# Patient Record
Sex: Female | Born: 1950
Health system: Southern US, Community
[De-identification: ages and names within clinical notes are randomized; demographics above are authoritative.]

## PROBLEM LIST (undated history)

## (undated) DIAGNOSIS — N029 Recurrent and persistent hematuria with unspecified morphologic changes: Secondary | ICD-10-CM

## (undated) DIAGNOSIS — R928 Other abnormal and inconclusive findings on diagnostic imaging of breast: Secondary | ICD-10-CM

## (undated) DIAGNOSIS — L409 Psoriasis, unspecified: Secondary | ICD-10-CM

## (undated) DIAGNOSIS — K219 Gastro-esophageal reflux disease without esophagitis: Secondary | ICD-10-CM

## (undated) DIAGNOSIS — F32A Depression, unspecified: Secondary | ICD-10-CM

## (undated) DIAGNOSIS — F329 Major depressive disorder, single episode, unspecified: Secondary | ICD-10-CM

## (undated) DIAGNOSIS — I10 Essential (primary) hypertension: Secondary | ICD-10-CM

## (undated) DIAGNOSIS — R51 Headache: Secondary | ICD-10-CM

## (undated) DIAGNOSIS — M8580 Other specified disorders of bone density and structure, unspecified site: Secondary | ICD-10-CM

## (undated) DIAGNOSIS — E785 Hyperlipidemia, unspecified: Secondary | ICD-10-CM

## (undated) DIAGNOSIS — R0683 Snoring: Secondary | ICD-10-CM

## (undated) DIAGNOSIS — K449 Diaphragmatic hernia without obstruction or gangrene: Secondary | ICD-10-CM

## (undated) DIAGNOSIS — Z98811 Dental restoration status: Secondary | ICD-10-CM

## (undated) DIAGNOSIS — H269 Unspecified cataract: Secondary | ICD-10-CM

## (undated) DIAGNOSIS — T7840XA Allergy, unspecified, initial encounter: Secondary | ICD-10-CM

## (undated) HISTORY — PX: OTHER SURGICAL HISTORY: SHX169

## (undated) HISTORY — DX: Depression, unspecified: F32.A

## (undated) HISTORY — PX: COLONOSCOPY: SHX174

## (undated) HISTORY — DX: Other specified disorders of bone density and structure, unspecified site: M85.80

## (undated) HISTORY — DX: Essential (primary) hypertension: I10

## (undated) HISTORY — PX: TONSILLECTOMY: SUR1361

## (undated) HISTORY — DX: Diaphragmatic hernia without obstruction or gangrene: K44.9

## (undated) HISTORY — DX: Allergy, unspecified, initial encounter: T78.40XA

## (undated) HISTORY — DX: Hyperlipidemia, unspecified: E78.5

## (undated) HISTORY — DX: Gastro-esophageal reflux disease without esophagitis: K21.9

## (undated) HISTORY — DX: Major depressive disorder, single episode, unspecified: F32.9

## (undated) HISTORY — DX: Unspecified cataract: H26.9

## (undated) HISTORY — PX: DILATION AND CURETTAGE OF UTERUS: SHX78

## (undated) HISTORY — DX: Psoriasis, unspecified: L40.9

---

## 2010-11-02 ENCOUNTER — Other Ambulatory Visit
Admission: RE | Admit: 2010-11-02 | Discharge: 2010-11-02 | Payer: Self-pay | Source: Home / Self Care | Admitting: Obstetrics and Gynecology

## 2010-11-16 LAB — HM MAMMOGRAPHY

## 2010-12-13 ENCOUNTER — Ambulatory Visit (INDEPENDENT_AMBULATORY_CARE_PROVIDER_SITE_OTHER): Payer: BC Managed Care – PPO | Admitting: Internal Medicine

## 2010-12-13 ENCOUNTER — Encounter: Payer: Self-pay | Admitting: Internal Medicine

## 2010-12-13 DIAGNOSIS — E119 Type 2 diabetes mellitus without complications: Secondary | ICD-10-CM

## 2010-12-13 DIAGNOSIS — E1169 Type 2 diabetes mellitus with other specified complication: Secondary | ICD-10-CM | POA: Insufficient documentation

## 2010-12-13 DIAGNOSIS — R945 Abnormal results of liver function studies: Secondary | ICD-10-CM

## 2010-12-13 DIAGNOSIS — R7989 Other specified abnormal findings of blood chemistry: Secondary | ICD-10-CM

## 2010-12-13 DIAGNOSIS — Z23 Encounter for immunization: Secondary | ICD-10-CM

## 2010-12-13 DIAGNOSIS — E1159 Type 2 diabetes mellitus with other circulatory complications: Secondary | ICD-10-CM | POA: Insufficient documentation

## 2010-12-13 DIAGNOSIS — K760 Fatty (change of) liver, not elsewhere classified: Secondary | ICD-10-CM | POA: Insufficient documentation

## 2010-12-13 DIAGNOSIS — N029 Recurrent and persistent hematuria with unspecified morphologic changes: Secondary | ICD-10-CM

## 2010-12-13 DIAGNOSIS — I10 Essential (primary) hypertension: Secondary | ICD-10-CM | POA: Insufficient documentation

## 2010-12-13 DIAGNOSIS — L409 Psoriasis, unspecified: Secondary | ICD-10-CM

## 2010-12-13 DIAGNOSIS — E785 Hyperlipidemia, unspecified: Secondary | ICD-10-CM

## 2010-12-13 DIAGNOSIS — F418 Other specified anxiety disorders: Secondary | ICD-10-CM

## 2010-12-13 DIAGNOSIS — I152 Hypertension secondary to endocrine disorders: Secondary | ICD-10-CM

## 2010-12-13 DIAGNOSIS — K219 Gastro-esophageal reflux disease without esophagitis: Secondary | ICD-10-CM

## 2010-12-13 LAB — HEPATIC FUNCTION PANEL
ALT: 52 U/L — ABNORMAL HIGH (ref 0–35)
AST: 44 U/L — ABNORMAL HIGH (ref 0–37)
Albumin: 4.4 g/dL (ref 3.5–5.2)
Total Protein: 7.5 g/dL (ref 6.0–8.3)

## 2010-12-13 MED ORDER — CITALOPRAM HYDROBROMIDE 20 MG PO TABS: 20.0000 mg | ORAL_TABLET | Freq: Every day | ORAL | Status: DC

## 2010-12-13 MED ORDER — EZETIMIBE-SIMVASTATIN 10-40 MG PO TABS: 1.0000 | ORAL_TABLET | Freq: Every day | ORAL | Status: DC

## 2010-12-13 MED ORDER — VITAMIN D 50 MCG (2000 UT) PO CAPS: 1.0000 | ORAL_CAPSULE | ORAL | Status: DC

## 2010-12-13 MED ORDER — OMEPRAZOLE 40 MG PO CPDR: 40.0000 mg | DELAYED_RELEASE_CAPSULE | Freq: Every day | ORAL | Status: DC

## 2010-12-13 NOTE — Assessment & Plan Note (Signed)
This is a new diagnosis from her physical she is on a statin drug and on Celexa an antidepressant we will repeat liver functions today to determine if further evaluation is necessary and/or removal of drugs for a trial to see if they're causing the elevation I suspect that the repeat

## 2010-12-13 NOTE — Assessment & Plan Note (Signed)
Diet controlled  Last A1C   6.5

## 2010-12-13 NOTE — Assessment & Plan Note (Signed)
On a topical cream and doing well if is faithfull with treament

## 2010-12-13 NOTE — Assessment & Plan Note (Signed)
Stable on an ARB

## 2010-12-13 NOTE — Assessment & Plan Note (Signed)
Stable on celexa

## 2010-12-14 ENCOUNTER — Other Ambulatory Visit: Payer: Self-pay | Admitting: *Deleted

## 2010-12-14 DIAGNOSIS — R7989 Other specified abnormal findings of blood chemistry: Secondary | ICD-10-CM

## 2010-12-20 ENCOUNTER — Ambulatory Visit
Admission: RE | Admit: 2010-12-20 | Discharge: 2010-12-20 | Disposition: A | Discharge status: Home / Self Care | Payer: BC Managed Care – PPO | Source: Ambulatory Visit | Attending: Internal Medicine | Admitting: Internal Medicine

## 2010-12-20 DIAGNOSIS — R7989 Other specified abnormal findings of blood chemistry: Secondary | ICD-10-CM

## 2011-01-09 ENCOUNTER — Encounter: Payer: Self-pay | Admitting: Internal Medicine

## 2011-02-02 ENCOUNTER — Telehealth: Payer: Self-pay | Admitting: Internal Medicine

## 2011-02-02 MED ORDER — VALSARTAN-HYDROCHLOROTHIAZIDE 80-12.5 MG PO TABS: 1.0000 | ORAL_TABLET | Freq: Every day | ORAL | Status: DC

## 2011-02-02 NOTE — Telephone Encounter (Signed)
done

## 2011-02-02 NOTE — Telephone Encounter (Signed)
Pt called and is req script for Diovan HCT 80-12.5 mg, to be sent to [facility].

## 2011-02-03 ENCOUNTER — Encounter: Payer: Self-pay | Admitting: Internal Medicine

## 2011-02-13 ENCOUNTER — Other Ambulatory Visit: Payer: Self-pay | Admitting: Internal Medicine

## 2011-02-13 DIAGNOSIS — Z1231 Encounter for screening mammogram for malignant neoplasm of breast: Secondary | ICD-10-CM

## 2011-02-21 ENCOUNTER — Ambulatory Visit
Admission: RE | Admit: 2011-02-21 | Discharge: 2011-02-21 | Disposition: A | Discharge status: Home / Self Care | Payer: BC Managed Care – PPO | Source: Ambulatory Visit | Attending: Internal Medicine | Admitting: Internal Medicine

## 2011-02-21 DIAGNOSIS — Z1231 Encounter for screening mammogram for malignant neoplasm of breast: Secondary | ICD-10-CM

## 2011-05-05 ENCOUNTER — Other Ambulatory Visit (INDEPENDENT_AMBULATORY_CARE_PROVIDER_SITE_OTHER): Payer: BC Managed Care – PPO | Admitting: Internal Medicine

## 2011-05-05 DIAGNOSIS — Z0000 Encounter for general adult medical examination without abnormal findings: Secondary | ICD-10-CM

## 2011-05-05 LAB — CBC WITH DIFFERENTIAL/PLATELET
Basophils Relative: 0.4 % (ref 0.0–3.0)
Eosinophils Absolute: 0.1 10*3/uL (ref 0.0–0.7)
Hemoglobin: 14.5 g/dL (ref 12.0–15.0)
Lymphs Abs: 2.2 10*3/uL (ref 0.7–4.0)
MCHC: 33.7 g/dL (ref 30.0–36.0)
MCV: 91.6 fl (ref 78.0–100.0)
Monocytes Absolute: 0.8 10*3/uL (ref 0.1–1.0)
Neutro Abs: 3.2 10*3/uL (ref 1.4–7.7)
RBC: 4.7 Mil/uL (ref 3.87–5.11)
RDW: 13.6 % (ref 11.5–14.6)

## 2011-05-05 LAB — HEPATIC FUNCTION PANEL
Albumin: 4.6 g/dL (ref 3.5–5.2)
Total Protein: 8 g/dL (ref 6.0–8.3)

## 2011-05-05 LAB — HEMOGLOBIN A1C: Hgb A1c MFr Bld: 6.8 % — ABNORMAL HIGH (ref 4.6–6.5)

## 2011-05-05 LAB — BASIC METABOLIC PANEL
CO2: 30 mEq/L (ref 19–32)
Chloride: 99 mEq/L (ref 96–112)
Glucose, Bld: 130 mg/dL — ABNORMAL HIGH (ref 70–99)
Sodium: 140 mEq/L (ref 135–145)

## 2011-05-05 LAB — LIPID PANEL
Cholesterol: 159 mg/dL (ref 0–200)
HDL: 47.7 mg/dL (ref >39.00)
Triglycerides: 179 mg/dL — ABNORMAL HIGH (ref 0.0–149.0)

## 2011-05-05 LAB — MICROALBUMIN / CREATININE URINE RATIO
Creatinine,U: 41.5 mg/dL
Microalb Creat Ratio: 4.1 mg/g (ref 0.0–30.0)

## 2011-05-12 ENCOUNTER — Encounter: Payer: Self-pay | Admitting: Internal Medicine

## 2011-05-12 ENCOUNTER — Ambulatory Visit (INDEPENDENT_AMBULATORY_CARE_PROVIDER_SITE_OTHER): Payer: 59 | Admitting: Internal Medicine

## 2011-05-12 DIAGNOSIS — I10 Essential (primary) hypertension: Secondary | ICD-10-CM

## 2011-05-12 DIAGNOSIS — Z0000 Encounter for general adult medical examination without abnormal findings: Secondary | ICD-10-CM

## 2011-05-12 DIAGNOSIS — E1159 Type 2 diabetes mellitus with other circulatory complications: Secondary | ICD-10-CM

## 2011-05-12 DIAGNOSIS — E785 Hyperlipidemia, unspecified: Secondary | ICD-10-CM

## 2011-05-12 DIAGNOSIS — E119 Type 2 diabetes mellitus without complications: Secondary | ICD-10-CM

## 2011-05-12 DIAGNOSIS — E1169 Type 2 diabetes mellitus with other specified complication: Secondary | ICD-10-CM

## 2011-05-12 DIAGNOSIS — I152 Hypertension secondary to endocrine disorders: Secondary | ICD-10-CM

## 2011-05-12 DIAGNOSIS — K219 Gastro-esophageal reflux disease without esophagitis: Secondary | ICD-10-CM

## 2011-05-12 NOTE — Patient Instructions (Signed)
Start the samples of metformin 500 mg once a day.  Our goal is over the next 2 months to try to get a 5-10 pound weight loss this will help with the fatty liver as well as the diabetes the metformin will help with appetite and to lower your blood glucose

## 2011-05-12 NOTE — Progress Notes (Signed)
Subjective:    Patient ID: Candice Guerrero, female    DOB: 1950-12-27, 60 y.o.   MRN: [id number]  HPI patient presents for a wellness examination.  Her comorbid findings are pending infiltration of her liver to 2 hyperlipidemia and adult-onset diabetes probable disc metabolic-type syndrome.  The patient has persistently elevated liver functions an ultrasound done at her last visit showed fatty infiltration she has not been able to achieve a weight loss but hasn't been able to keep her A1c in control however it is still greater than 6.5.  We will begin diabetic medications at this visit    Review of Systems  Constitutional: Negative for activity change, appetite change and fatigue.  HENT: Negative for ear pain, congestion, neck pain, postnasal drip and sinus pressure.   Eyes: Negative for redness and visual disturbance.  Respiratory: Negative for cough, shortness of breath and wheezing.   Gastrointestinal: Negative for abdominal pain and abdominal distention.  Genitourinary: Negative for dysuria, frequency and menstrual problem.  Musculoskeletal: Negative for myalgias, joint swelling and arthralgias.  Skin: Negative for rash and wound.  Neurological: Negative for dizziness, weakness and headaches.  Hematological: Negative for adenopathy. Does not bruise/bleed easily.  Psychiatric/Behavioral: Negative for sleep disturbance and decreased concentration.       Past Medical History  Diagnosis Date  . Depression   . Diabetes mellitus   . Diverticulosis   . GERD (gastroesophageal reflux disease)   . Hypertension   . Hyperlipidemia   . Psoriasis   . Chronic UTI    Past Surgical History  Procedure Date  . Dilation and curettage of uterus   . Tonsillectomy     reports that she has quit smoking. She has never used smokeless tobacco. She reports that she drinks about 1.8 ounces of alcohol per week. She reports that she does not use illicit drugs. family history includes Hyperlipidemia in  her mother; Hypertension in her father and mother; and Kidney disease in her father. No Known Allergies  Objective:   Physical Exam  Vitals reviewed. Constitutional: She is oriented to person, place, and time. She appears well-developed and well-nourished. No distress.  HENT:  Head: Normocephalic and atraumatic.  Right Ear: External ear normal.  Left Ear: External ear normal.  Nose: Nose normal.  Mouth/Throat: Oropharynx is clear and moist.  Eyes: Conjunctivae and EOM are normal. Pupils are equal, round, and reactive to light.  Neck: Normal range of motion. Neck supple. No JVD present. No tracheal deviation present. No thyromegaly present.  Cardiovascular: Normal rate, regular rhythm, normal heart sounds and intact distal pulses.   No murmur heard. Pulmonary/Chest: Effort normal and breath sounds normal. She has no wheezes. She exhibits no tenderness.  Abdominal: Soft. Bowel sounds are normal.  Musculoskeletal: Normal range of motion. She exhibits no edema and no tenderness.  Lymphadenopathy:    She has no cervical adenopathy.  Neurological: She is alert and oriented to person, place, and time. She has normal reflexes. No cranial nerve deficit.  Skin: Skin is warm and dry. She is not diaphoretic.  Psychiatric: She has a normal mood and affect. Her behavior is normal.          Assessment & Plan:    This is a routine physical examination for this healthy  Female. Reviewed all health maintenance protocols including mammography colonoscopy bone density and reviewed appropriate screening labs. Her immunization history was reviewed as well as her current medications and allergies refills of her chronic medications were given and the plan for  yearly health maintenance was discussed all orders and referrals were made as appropriate.   The patient has adult-onset diabetes with poor control with hemoglobin A1c greater than 6.5 we'll begin metformin 500 mg daily his intervention help her  both with weight loss and with control of her A1c.  The patient has hypertension which is well-controlled the patient has reflux esophagitis which is well-controlled will return in 6 months with a hemoglobin A1c and a differential function for the abnormal liver functions and for the abnormal A1c a 5-10 weight loss is recommended

## 2011-05-15 ENCOUNTER — Telehealth: Payer: Self-pay | Admitting: *Deleted

## 2011-05-15 NOTE — Telephone Encounter (Signed)
Dr Lovell Sheehan gave pt Metformin samples 1000 mg but her pt instructions says 500 mg.  Pt has been taking the 1000 mg and wanted to know if she should cut the tablet in half.  Per Dr Lovell Sheehan break in half. Pt aware

## 2011-07-12 ENCOUNTER — Ambulatory Visit: Payer: 59 | Admitting: Internal Medicine

## 2011-08-09 ENCOUNTER — Ambulatory Visit (INDEPENDENT_AMBULATORY_CARE_PROVIDER_SITE_OTHER): Payer: 59

## 2011-08-09 DIAGNOSIS — Z23 Encounter for immunization: Secondary | ICD-10-CM

## 2011-08-30 ENCOUNTER — Ambulatory Visit: Payer: 59 | Admitting: Internal Medicine

## 2011-09-18 ENCOUNTER — Ambulatory Visit: Payer: 59 | Admitting: Internal Medicine

## 2011-10-18 ENCOUNTER — Encounter: Payer: Self-pay | Admitting: Internal Medicine

## 2011-10-18 ENCOUNTER — Ambulatory Visit (INDEPENDENT_AMBULATORY_CARE_PROVIDER_SITE_OTHER): Payer: Self-pay | Admitting: Internal Medicine

## 2011-10-18 VITALS — BP 136/90 | HR 76 | Temp 98.2°F | Resp 16 | Ht 60.0 in | Wt 165.0 lb

## 2011-10-18 DIAGNOSIS — F39 Unspecified mood [affective] disorder: Secondary | ICD-10-CM

## 2011-10-18 DIAGNOSIS — E119 Type 2 diabetes mellitus without complications: Secondary | ICD-10-CM

## 2011-10-18 DIAGNOSIS — K219 Gastro-esophageal reflux disease without esophagitis: Secondary | ICD-10-CM

## 2011-10-18 DIAGNOSIS — I10 Essential (primary) hypertension: Secondary | ICD-10-CM

## 2011-10-18 DIAGNOSIS — M810 Age-related osteoporosis without current pathological fracture: Secondary | ICD-10-CM

## 2011-10-18 DIAGNOSIS — E785 Hyperlipidemia, unspecified: Secondary | ICD-10-CM

## 2011-10-18 LAB — LIPID PANEL
Cholesterol: 245 mg/dL — ABNORMAL HIGH (ref 0–200)
Total CHOL/HDL Ratio: 5

## 2011-10-18 LAB — HEMOGLOBIN A1C: Hgb A1c MFr Bld: 6.5 % (ref 4.6–6.5)

## 2011-10-18 MED ORDER — VALSARTAN-HYDROCHLOROTHIAZIDE 80-12.5 MG PO TABS: 1.0000 | ORAL_TABLET | Freq: Every day | ORAL | Status: DC

## 2011-10-18 MED ORDER — OMEPRAZOLE 40 MG PO CPDR: 40.0000 mg | DELAYED_RELEASE_CAPSULE | Freq: Every day | ORAL | Status: DC

## 2011-10-18 MED ORDER — CITALOPRAM HYDROBROMIDE 20 MG PO TABS: 20.0000 mg | ORAL_TABLET | Freq: Every day | ORAL | Status: DC

## 2011-10-18 MED ORDER — EZETIMIBE-SIMVASTATIN 10-40 MG PO TABS: 1.0000 | ORAL_TABLET | Freq: Every day | ORAL | Status: DC

## 2011-10-18 MED ORDER — RISEDRONATE SODIUM 150 MG PO TABS: 150.0000 mg | ORAL_TABLET | ORAL | Status: DC

## 2011-10-18 NOTE — Patient Instructions (Signed)
FOLLOW A DIET AND EXERCISE PROGRAM

## 2011-10-18 NOTE — Progress Notes (Signed)
Subjective:    Patient ID: Candice Guerrero, female    DOB: 11/18/1950, 61 y.o.   MRN: [id number]  HPI Patient is a 61 year old white female followed for hypertension hyperlipidemia and osteoporosis she presents today with a new insurance plan and needs 90 day prescriptions of all of her medications in addition she requires screening for her lipids monitoring of her blood pressure and monitoring of hemoglobin A1c to make sure that her diabetes remains controlled on metformin alone she denies any chest pain shortness of breath PND orthopnea her weight has been stable but she has not been exercising   Review of Systems  Constitutional: Negative for activity change, appetite change and fatigue.  HENT: Negative for ear pain, congestion, neck pain, postnasal drip and sinus pressure.   Eyes: Negative for redness and visual disturbance.  Respiratory: Negative for cough, shortness of breath and wheezing.   Gastrointestinal: Negative for abdominal pain and abdominal distention.  Genitourinary: Negative for dysuria, frequency and menstrual problem.  Musculoskeletal: Negative for myalgias, joint swelling and arthralgias.  Skin: Negative for rash and wound.  Neurological: Negative for dizziness, weakness and headaches.  Hematological: Negative for adenopathy. Does not bruise/bleed easily.  Psychiatric/Behavioral: Negative for sleep disturbance and decreased concentration.   Past Medical History  Diagnosis Date  . Depression   . Diabetes mellitus   . Diverticulosis   . GERD (gastroesophageal reflux disease)   . Hypertension   . Hyperlipidemia   . Psoriasis   . Chronic UTI     History   Social History  . Marital Status: Unknown    Spouse Name: N/A    Number of Children: N/A  . Years of Education: N/A   Occupational History  . Not on file.   Social History Main Topics  . Smoking status: Former Games developer  . Smokeless tobacco: Never Used   Comment: stopped in 2002  . Alcohol Use: 1.8 oz/week      3 Glasses of wine per week  . Drug Use: No  . Sexually Active: Yes   Other Topics Concern  . Not on file   Social History Narrative  . No narrative on file    Past Surgical History  Procedure Date  . Dilation and curettage of uterus   . Tonsillectomy     Family History  Problem Relation Age of Onset  . Hyperlipidemia Mother   . Hypertension Mother   . Hypertension Father   . Kidney disease Father     No Known Allergies  Current Outpatient Prescriptions on File Prior to Visit  Medication Sig Dispense Refill  . aspirin 81 MG tablet Take 81 mg by mouth daily.        Marland Kitchen aspirin-acetaminophen-caffeine (EXCEDRIN MIGRAINE) 250-250-65 MG per tablet Take 1 tablet by mouth every 6 (six) hours as needed.        . calcipotriene-betamethasone (TACLONEX) ointment Apply topically daily.        . Calcium Carbonate (CALTRATE 600) 1500 MG TABS Take by mouth daily.        . Cholecalciferol (VITAMIN D) 2000 UNITS CAPS Take 1 capsule (2,000 Units total) by mouth 1 dose over 46 hours.  30 capsule    . fish oil-omega-3 fatty acids 1000 MG capsule Take 2 g by mouth daily.          BP 136/90  Pulse 76  Temp 98.2 F (36.8 C)  Resp 16  Ht 5' (1.524 m)  Wt 165 lb (74.844 kg)  BMI 32.22 kg/m2  Objective:   Physical Exam  Nursing note and vitals reviewed. Constitutional: She is oriented to person, place, and time. She appears well-developed and well-nourished. No distress.  HENT:  Head: Normocephalic and atraumatic.  Right Ear: External ear normal.  Left Ear: External ear normal.  Nose: Nose normal.  Mouth/Throat: Oropharynx is clear and moist.  Eyes: Conjunctivae and EOM are normal. Pupils are equal, round, and reactive to light.  Neck: Normal range of motion. Neck supple. No JVD present. No tracheal deviation present. No thyromegaly present.  Cardiovascular: Normal rate, regular rhythm, normal heart sounds and intact distal pulses.   No murmur heard. Pulmonary/Chest: Effort  normal and breath sounds normal. She has no wheezes. She exhibits no tenderness.  Abdominal: Soft. Bowel sounds are normal.  Musculoskeletal: Normal range of motion. She exhibits no edema and no tenderness.  Lymphadenopathy:    She has no cervical adenopathy.  Neurological: She is alert and oriented to person, place, and time. She has normal reflexes. No cranial nerve deficit.  Skin: Skin is warm and dry. She is not diaphoretic.  Psychiatric: She has a normal mood and affect. Her behavior is normal.          Assessment & Plan:  Stable diabetes per the patient and per her CBGs. We'll measure hemoglobin A1c today.  Patient has been compliant with her lipid lowering medications we will measure a lipid profile today the patient has struggled with diet and exercise we discussed referral to nutrition as well as beginning a program of regular exercise.  The patient's blood pressure is stable her current medications

## 2011-11-30 ENCOUNTER — Other Ambulatory Visit (HOSPITAL_COMMUNITY)
Admission: RE | Admit: 2011-11-30 | Discharge: 2011-11-30 | Disposition: A | Discharge status: Home / Self Care | Payer: BC Managed Care – PPO | Source: Ambulatory Visit | Attending: Obstetrics and Gynecology | Admitting: Obstetrics and Gynecology

## 2011-11-30 DIAGNOSIS — Z01419 Encounter for gynecological examination (general) (routine) without abnormal findings: Secondary | ICD-10-CM | POA: Insufficient documentation

## 2012-01-15 ENCOUNTER — Other Ambulatory Visit: Payer: Self-pay | Admitting: Internal Medicine

## 2012-01-15 DIAGNOSIS — Z1231 Encounter for screening mammogram for malignant neoplasm of breast: Secondary | ICD-10-CM

## 2012-01-16 ENCOUNTER — Encounter: Payer: Self-pay | Admitting: Internal Medicine

## 2012-01-16 ENCOUNTER — Ambulatory Visit (INDEPENDENT_AMBULATORY_CARE_PROVIDER_SITE_OTHER): Payer: BC Managed Care – PPO | Admitting: Internal Medicine

## 2012-01-16 ENCOUNTER — Other Ambulatory Visit: Payer: Self-pay | Admitting: Internal Medicine

## 2012-01-16 VITALS — BP 132/84 | HR 72 | Temp 98.6°F | Resp 16 | Ht 60.0 in | Wt 166.0 lb

## 2012-01-16 DIAGNOSIS — I152 Hypertension secondary to endocrine disorders: Secondary | ICD-10-CM

## 2012-01-16 DIAGNOSIS — L408 Other psoriasis: Secondary | ICD-10-CM

## 2012-01-16 DIAGNOSIS — R945 Abnormal results of liver function studies: Secondary | ICD-10-CM

## 2012-01-16 DIAGNOSIS — I10 Essential (primary) hypertension: Secondary | ICD-10-CM

## 2012-01-16 DIAGNOSIS — E785 Hyperlipidemia, unspecified: Secondary | ICD-10-CM

## 2012-01-16 DIAGNOSIS — R7989 Other specified abnormal findings of blood chemistry: Secondary | ICD-10-CM

## 2012-01-16 DIAGNOSIS — E1169 Type 2 diabetes mellitus with other specified complication: Secondary | ICD-10-CM

## 2012-01-16 DIAGNOSIS — E119 Type 2 diabetes mellitus without complications: Secondary | ICD-10-CM

## 2012-01-16 DIAGNOSIS — L409 Psoriasis, unspecified: Secondary | ICD-10-CM

## 2012-01-16 DIAGNOSIS — E1159 Type 2 diabetes mellitus with other circulatory complications: Secondary | ICD-10-CM

## 2012-01-16 LAB — CBC WITH DIFFERENTIAL/PLATELET
Basophils Relative: 0.4 % (ref 0.0–3.0)
Eosinophils Relative: 1 % (ref 0.0–5.0)
Lymphocytes Relative: 34.6 % (ref 12.0–46.0)
MCV: 92 fl (ref 78.0–100.0)
Neutrophils Relative %: 53.4 % (ref 43.0–77.0)
RBC: 4.71 Mil/uL (ref 3.87–5.11)
WBC: 6.1 10*3/uL (ref 4.5–10.5)

## 2012-01-16 LAB — HEPATIC FUNCTION PANEL
AST: 81 U/L — ABNORMAL HIGH (ref 0–37)
Alkaline Phosphatase: 85 U/L (ref 39–117)
Bilirubin, Direct: 0 mg/dL (ref 0.0–0.3)
Total Protein: 7.5 g/dL (ref 6.0–8.3)

## 2012-01-16 LAB — LDL CHOLESTEROL, DIRECT: Direct LDL: 122.7 mg/dL

## 2012-01-16 LAB — LIPID PANEL
HDL: 45.8 mg/dL (ref >39.00)
VLDL: 71.6 mg/dL — ABNORMAL HIGH (ref 0.0–40.0)

## 2012-01-16 LAB — HEMOGLOBIN A1C: Hgb A1c MFr Bld: 6.9 % — ABNORMAL HIGH (ref 4.6–6.5)

## 2012-01-16 NOTE — Patient Instructions (Signed)
Try the Elidel cream twice daily in place of your current psoriasis treatment to see how her works over the next few weeks if it works better and does not then the skin and caused the redness that your current medication does then we will call in a prescription for you

## 2012-01-16 NOTE — Progress Notes (Signed)
Subjective:    Patient ID: Candice Guerrero, female    DOB: [birth year], 61 y.o.   MRN: [id number]  HPI Patient is a 61 year old white female who presents for followup of diabetes hypertension hyperlipidemia and gastroesophageal reflux she was without complaint today with the exception of marked erythema at the site of her psoriatic plaques of the use of her steroid cream she states that she thinks her skin and then significantly hepatocytes and just to keep a Band-Aid over the site of skin from breaking.     Review of Systems  Constitutional: Negative for activity change, appetite change and fatigue.  HENT: Negative for ear pain, congestion, neck pain, postnasal drip and sinus pressure.   Eyes: Negative for redness and visual disturbance.  Respiratory: Negative for cough, shortness of breath and wheezing.   Gastrointestinal: Negative for abdominal pain and abdominal distention.  Genitourinary: Negative for dysuria, frequency and menstrual problem.  Musculoskeletal: Negative for myalgias, joint swelling and arthralgias.  Skin: Negative for rash and wound.  Neurological: Negative for dizziness, weakness and headaches.  Hematological: Negative for adenopathy. Does not bruise/bleed easily.  Psychiatric/Behavioral: Negative for sleep disturbance and decreased concentration.       Past Medical History  Diagnosis Date  . Depression   . Diabetes mellitus   . Diverticulosis   . GERD (gastroesophageal reflux disease)   . Hypertension   . Hyperlipidemia   . Psoriasis   . Chronic UTI     History   Social History  . Marital Status: Unknown    Spouse Name: N/A    Number of Children: N/A  . Years of Education: N/A   Occupational History  . Not on file.   Social History Main Topics  . Smoking status: Former Games developer  . Smokeless tobacco: Never Used   Comment: stopped in 2002  . Alcohol Use: 1.8 oz/week    3 Glasses of wine per week  . Drug Use: No  . Sexually Active: Yes   Other  Topics Concern  . Not on file   Social History Narrative  . No narrative on file    Past Surgical History  Procedure Date  . Dilation and curettage of uterus   . Tonsillectomy     Family History  Problem Relation Age of Onset  . Hyperlipidemia Mother   . Hypertension Mother   . Hypertension Father   . Kidney disease Father     No Known Allergies  Current Outpatient Prescriptions on File Prior to Visit  Medication Sig Dispense Refill  . aspirin 81 MG tablet Take 81 mg by mouth daily.        Marland Kitchen aspirin-acetaminophen-caffeine (EXCEDRIN MIGRAINE) 250-250-65 MG per tablet Take 1 tablet by mouth every 6 (six) hours as needed.        . calcipotriene-betamethasone (TACLONEX) ointment Apply topically daily.        . Calcium Carbonate (CALTRATE 600) 1500 MG TABS Take by mouth daily.        . citalopram (CELEXA) 20 MG tablet Take 1 tablet (20 mg total) by mouth daily.  90 tablet  3  . ezetimibe-simvastatin (VYTORIN) 10-40 MG per tablet Take 1 tablet by mouth at bedtime.  90 tablet  3  . fish oil-omega-3 fatty acids 1000 MG capsule Take 2 g by mouth daily.        . metFORMIN (GLUCOPHAGE) 500 MG tablet Take 500 mg by mouth daily with breakfast.        . omeprazole (PRILOSEC) 40 MG capsule  Take 1 capsule (40 mg total) by mouth daily.  90 capsule  3  . risedronate (ACTONEL) 150 MG tablet Take 1 tablet (150 mg total) by mouth every 30 (thirty) days. with water on empty stomach, nothing by mouth or lie down for next 30 minutes.  3 tablet  3  . valsartan-hydrochlorothiazide (DIOVAN-HCT) 80-12.5 MG per tablet Take 1 tablet by mouth daily.  90 tablet  3  . DISCONTD: citalopram (CELEXA) 20 MG tablet Take 1 tablet (20 mg total) by mouth daily.  90 tablet  3  . DISCONTD: ezetimibe-simvastatin (VYTORIN) 10-40 MG per tablet Take 1 tablet by mouth at bedtime.  90 tablet  3  . DISCONTD: omeprazole (PRILOSEC) 40 MG capsule Take 1 capsule (40 mg total) by mouth daily.  90 capsule  3    BP 132/84  Pulse  72  Temp 98.6 F (37 C)  Resp 16  Ht 5' (1.524 m)  Wt 166 lb (75.297 kg)  BMI 32.42 kg/m2    Objective:   Physical Exam  Nursing note and vitals reviewed. Constitutional: She is oriented to person, place, and time. She appears well-developed and well-nourished. No distress.  HENT:  Head: Normocephalic and atraumatic.  Right Ear: External ear normal.  Left Ear: External ear normal.  Nose: Nose normal.  Mouth/Throat: Oropharynx is clear and moist.  Eyes: Conjunctivae and EOM are normal. Pupils are equal, round, and reactive to light.  Neck: Normal range of motion. Neck supple. No JVD present. No tracheal deviation present. No thyromegaly present.  Cardiovascular: Normal rate, regular rhythm, normal heart sounds and intact distal pulses.   No murmur heard. Pulmonary/Chest: Effort normal and breath sounds normal. She has no wheezes. She exhibits no tenderness.  Abdominal: Soft. Bowel sounds are normal.  Musculoskeletal: Normal range of motion. She exhibits no edema and no tenderness.  Lymphadenopathy:    She has no cervical adenopathy.  Neurological: She is alert and oriented to person, place, and time. She has normal reflexes. No cranial nerve deficit.  Skin: Skin is warm and dry. She is not diaphoretic.  Psychiatric: She has a normal mood and affect. Her behavior is normal.          Assessment & Plan:  The patient is a 61 your old female followed for 61 hypertension hyperlipidemia and psoriasis we are going to try Elidel cream twice daily for a psoriasis intervention because she is getting marked redness at the site of her psoriatic plaques with use of steroid creams.  She thinks that her diabetes has been well-controlled her CBGs have been stable we're to measure a hemoglobin A1c today she also has to a lipid and liver monitoring as well as a CBC and a thyroid with

## 2012-01-22 ENCOUNTER — Ambulatory Visit
Admission: RE | Admit: 2012-01-22 | Discharge: 2012-01-22 | Disposition: A | Discharge status: Home / Self Care | Payer: Self-pay | Source: Ambulatory Visit | Attending: Internal Medicine | Admitting: Internal Medicine

## 2012-01-22 DIAGNOSIS — R7989 Other specified abnormal findings of blood chemistry: Secondary | ICD-10-CM

## 2012-02-22 ENCOUNTER — Ambulatory Visit
Admission: RE | Admit: 2012-02-22 | Discharge: 2012-02-22 | Disposition: A | Discharge status: Home / Self Care | Payer: BC Managed Care – PPO | Source: Ambulatory Visit | Attending: Internal Medicine | Admitting: Internal Medicine

## 2012-02-22 DIAGNOSIS — Z1231 Encounter for screening mammogram for malignant neoplasm of breast: Secondary | ICD-10-CM

## 2012-02-26 ENCOUNTER — Telehealth: Payer: Self-pay | Admitting: Internal Medicine

## 2012-02-26 MED ORDER — METFORMIN HCL 500 MG PO TABS: 500.0000 mg | ORAL_TABLET | Freq: Every day | ORAL | Status: DC

## 2012-02-26 NOTE — Telephone Encounter (Signed)
Patient called stating that she need a refill of her metformin 500mg  [facility]  [facility] Please assist.

## 2012-02-26 NOTE — Telephone Encounter (Signed)
done

## 2012-04-11 ENCOUNTER — Telehealth: Payer: Self-pay | Admitting: Internal Medicine

## 2012-04-11 ENCOUNTER — Other Ambulatory Visit: Payer: Self-pay | Admitting: Internal Medicine

## 2012-04-11 DIAGNOSIS — S62109A Fracture of unspecified carpal bone, unspecified wrist, initial encounter for closed fracture: Secondary | ICD-10-CM

## 2012-04-11 NOTE — Telephone Encounter (Signed)
Pt fell yesterday and broke her wrist. Pt went to hosp in [place] and was told that she will have to have surgery on her wrist. Pt needs referral to Orthopedic Surgeon. Pls call.

## 2012-04-11 NOTE — Telephone Encounter (Signed)
Per dr Lovell Sheehan- to hand surgeon-referral sent to [facility]

## 2012-04-19 ENCOUNTER — Encounter (HOSPITAL_BASED_OUTPATIENT_CLINIC_OR_DEPARTMENT_OTHER): Payer: Self-pay | Admitting: *Deleted

## 2012-04-19 NOTE — Pre-Procedure Instructions (Signed)
To come for BMET and EKG 

## 2012-04-22 ENCOUNTER — Encounter (HOSPITAL_BASED_OUTPATIENT_CLINIC_OR_DEPARTMENT_OTHER)
Admission: RE | Admit: 2012-04-22 | Discharge: 2012-04-22 | Disposition: A | Discharge status: Home / Self Care | Payer: BC Managed Care – PPO | Source: Ambulatory Visit | Attending: Orthopedic Surgery | Admitting: Orthopedic Surgery

## 2012-04-22 LAB — BASIC METABOLIC PANEL
BUN: 11 mg/dL (ref 6–23)
CO2: 27 mEq/L (ref 19–32)
Chloride: 101 mEq/L (ref 96–112)
Creatinine, Ser: 0.79 mg/dL (ref 0.50–1.10)
GFR calc Af Amer: >90 mL/min (ref >90)
Potassium: 3.6 mEq/L (ref 3.5–5.1)

## 2012-04-22 NOTE — H&P (Signed)
Candice Guerrero is an 61 y.o. female.   Chief Complaint: c/o acute injury to the left hand/wrist HPI: . 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 She is left hand dominant. She was descending stairs at her son's home and fell nearly a full flight of stairs landing onto her left arm. She had immediate pain in her wrist and was seen at the [facility] in [place]. X-rays revealed an impacted comminuted fracture of her pisiform and a impacted fracture of her distal radius with an ulnar styloid fracture that was significantly displaced. She had a hematoma block placed, closed reduction and application of a sugar tong splint. She now returns for follow up at this time.     Past Medical History  Diagnosis Date  . Depression   . GERD (gastroesophageal reflux disease)   . Hyperlipidemia   . Psoriasis     arms, legs, back  . Headache     migraines, sinus  . Diabetes mellitus     NIDDM  . Hypertension     under control, has been on med. since 2005  . Benign hematuria   . Snores     denies sx. of sleep apnea  . Dental crowns present   . Cataract immature     left eye    Past Surgical History  Procedure Date  . Dilation and curettage of uterus   . Tonsillectomy     Family History  Problem Relation Age of Onset  . Hyperlipidemia Mother   . Hypertension Mother   . Hypertension Father   . Kidney disease Father   . Anesthesia problems Sister     post-op N/V, hard to wake up post-op   Social History:  reports that she has quit smoking. She has never used smokeless tobacco. She reports that she drinks alcohol. She reports that she does not use illicit drugs.  Allergies:  Allergies  Allergen Reactions  . Hydrocodone-Acetaminophen Nausea And Vomiting    DIZZINESS  . Altace (Ramipril) Cough    No prescriptions prior to admission    Results for orders placed during the  hospital encounter of 04/23/12 (from the past 48 hour(s))  BASIC METABOLIC PANEL     Status: Abnormal   Collection Time   04/22/12 10:30 AM      Component Value Range Comment   Sodium 141  135 - 145 mEq/L    Potassium 3.6  3.5 - 5.1 mEq/L    Chloride 101  96 - 112 mEq/L    CO2 27  19 - 32 mEq/L    Glucose, Bld 154 (*) 70 - 99 mg/dL    BUN 11  6 - 23 mg/dL    Creatinine, Ser 1.61  0.50 - 1.10 mg/dL    Calcium 9.6  8.4 - 09.6 mg/dL    GFR calc non Af Amer 87 (*) >90 mL/min    GFR calc Af Amer >90  >90 mL/min     No results found.   Pertinent items are noted in HPI.  Height 5\' 1"  (1.549 m), weight 72.576 kg (160 lb).  General appearance: alert Head: Normocephalic, without obvious abnormality Neck: supple, symmetrical, trachea midline Resp: clear to auscultation bilaterally Cardio: regular rate and rhythm GI: normal findings: bowel sounds normal ExtremitiesShe has moderate swelling of her wrist. Her motor and sensory exam is intact. Her pulses and capillary refill are intact. She does not  appear to have signs of significant head or neck trauma. She is ambulatory. Her x-rays are reviewed and reveal comminuted articular fracture of the distal radius intraarticular at the distal radial ulnar joint and a displaced ulnar styloid fracture. Her oblique film demonstrates a comminuted impacted pisiform fracture. She also has an intraarticular component at the radial styloid of her distal radius fracture.  Pulses: 2+ and symmetric Skin:Normal Neurologic: alert    Assessment/Plan Assessment: Intraarticular fracture of the left distal radius, displaced ulnar styloid fracture and comminuted pisiform. While it is possible her distal radius fracture could be treated with closed technique and have an acceptable result, she will not tolerate a pisiform fracture well.  Her ulnar styloid is displaced to the point she may have instability at the distal radial ulnar joint.  Plan:We have recommended  that she proceed with ORIF of her left distal radius, ulnar styloid, and resection of the pisiform. The surgery, after care, risks and benefits were described in detail.  We will schedule this on a mutually convenient basis   DASNOIT,Alani Sabbagh J 04/22/2012, 4:35 PM  .hpu

## 2012-04-23 ENCOUNTER — Ambulatory Visit (HOSPITAL_BASED_OUTPATIENT_CLINIC_OR_DEPARTMENT_OTHER): Payer: BC Managed Care – PPO | Admitting: Anesthesiology

## 2012-04-23 ENCOUNTER — Encounter (HOSPITAL_BASED_OUTPATIENT_CLINIC_OR_DEPARTMENT_OTHER): Payer: Self-pay | Admitting: Anesthesiology

## 2012-04-23 ENCOUNTER — Encounter (HOSPITAL_BASED_OUTPATIENT_CLINIC_OR_DEPARTMENT_OTHER)
Admission: RE | Disposition: A | Discharge status: Home / Self Care | Payer: Self-pay | Source: Ambulatory Visit | Attending: Orthopedic Surgery

## 2012-04-23 ENCOUNTER — Ambulatory Visit (HOSPITAL_BASED_OUTPATIENT_CLINIC_OR_DEPARTMENT_OTHER)
Admission: RE | Admit: 2012-04-23 | Discharge: 2012-04-23 | Disposition: A | Discharge status: Home / Self Care | Payer: BC Managed Care – PPO | Source: Ambulatory Visit | Attending: Orthopedic Surgery | Admitting: Orthopedic Surgery

## 2012-04-23 ENCOUNTER — Encounter (HOSPITAL_BASED_OUTPATIENT_CLINIC_OR_DEPARTMENT_OTHER): Payer: Self-pay | Admitting: *Deleted

## 2012-04-23 ENCOUNTER — Other Ambulatory Visit: Payer: Self-pay | Admitting: Orthopedic Surgery

## 2012-04-23 DIAGNOSIS — R51 Headache: Secondary | ICD-10-CM | POA: Insufficient documentation

## 2012-04-23 DIAGNOSIS — S52509A Unspecified fracture of the lower end of unspecified radius, initial encounter for closed fracture: Secondary | ICD-10-CM | POA: Insufficient documentation

## 2012-04-23 DIAGNOSIS — Z01810 Encounter for preprocedural cardiovascular examination: Secondary | ICD-10-CM | POA: Insufficient documentation

## 2012-04-23 DIAGNOSIS — I10 Essential (primary) hypertension: Secondary | ICD-10-CM | POA: Insufficient documentation

## 2012-04-23 DIAGNOSIS — L408 Other psoriasis: Secondary | ICD-10-CM | POA: Insufficient documentation

## 2012-04-23 DIAGNOSIS — S62163A Displaced fracture of pisiform, unspecified wrist, initial encounter for closed fracture: Secondary | ICD-10-CM | POA: Insufficient documentation

## 2012-04-23 DIAGNOSIS — K219 Gastro-esophageal reflux disease without esophagitis: Secondary | ICD-10-CM | POA: Insufficient documentation

## 2012-04-23 DIAGNOSIS — S52609A Unspecified fracture of lower end of unspecified ulna, initial encounter for closed fracture: Secondary | ICD-10-CM | POA: Insufficient documentation

## 2012-04-23 DIAGNOSIS — W108XXA Fall (on) (from) other stairs and steps, initial encounter: Secondary | ICD-10-CM | POA: Insufficient documentation

## 2012-04-23 DIAGNOSIS — Y998 Other external cause status: Secondary | ICD-10-CM | POA: Insufficient documentation

## 2012-04-23 HISTORY — DX: Recurrent and persistent hematuria with unspecified morphologic changes: N02.9

## 2012-04-23 HISTORY — DX: Headache: R51

## 2012-04-23 HISTORY — DX: Snoring: R06.83

## 2012-04-23 HISTORY — DX: Dental restoration status: Z98.811

## 2012-04-23 LAB — GLUCOSE, CAPILLARY
Glucose-Capillary: 93 mg/dL (ref 70–99)
Glucose-Capillary: 110 mg/dL — ABNORMAL HIGH (ref 70–99)

## 2012-04-23 SURGERY — OPEN REDUCTION INTERNAL FIXATION (ORIF) DISTAL RADIUS FRACTURE
Anesthesia: General | Site: Wrist | Laterality: Left | Wound class: Clean

## 2012-04-23 MED ORDER — METOCLOPRAMIDE HCL 5 MG/ML IJ SOLN: 10.0000 mg | Freq: Once | INTRAMUSCULAR | Status: DC | PRN

## 2012-04-23 MED ORDER — MIDAZOLAM HCL 2 MG/2ML IJ SOLN
0.5000 mg | INTRAMUSCULAR | Status: DC | PRN
  Administered 2012-04-23: 2 mg via INTRAVENOUS

## 2012-04-23 MED ORDER — FENTANYL CITRATE 0.05 MG/ML IJ SOLN: 25.0000 ug | INTRAMUSCULAR | Status: DC | PRN

## 2012-04-23 MED ORDER — CHLORHEXIDINE GLUCONATE 4 % EX LIQD: 60.0000 mL | Freq: Once | CUTANEOUS | Status: DC

## 2012-04-23 MED ORDER — FENTANYL CITRATE 0.05 MG/ML IJ SOLN
INTRAMUSCULAR | Status: DC | PRN
  Administered 2012-04-23: 25 ug via INTRAVENOUS

## 2012-04-23 MED ORDER — ONDANSETRON HCL 4 MG/2ML IJ SOLN
INTRAMUSCULAR | Status: DC | PRN
  Administered 2012-04-23: 4 mg via INTRAVENOUS

## 2012-04-23 MED ORDER — CEFAZOLIN SODIUM 1-5 GM-% IV SOLN
1.0000 g | Freq: Once | INTRAVENOUS | Status: AC
  Administered 2012-04-23: 1 g via INTRAVENOUS

## 2012-04-23 MED ORDER — LACTATED RINGERS IV SOLN
INTRAVENOUS | Status: DC
  Administered 2012-04-23: 14:00:00 via INTRAVENOUS

## 2012-04-23 MED ORDER — OXYCODONE HCL 5 MG PO TABS: 5.0000 mg | ORAL_TABLET | Freq: Once | ORAL | Status: DC | PRN

## 2012-04-23 MED ORDER — HYDROMORPHONE HCL 2 MG PO TABS: ORAL_TABLET | ORAL | Status: AC

## 2012-04-23 MED ORDER — DEXAMETHASONE SODIUM PHOSPHATE 4 MG/ML IJ SOLN
INTRAMUSCULAR | Status: DC | PRN
  Administered 2012-04-23: 5 mg via INTRAVENOUS

## 2012-04-23 MED ORDER — PROPOFOL 10 MG/ML IV EMUL
INTRAVENOUS | Status: DC | PRN
  Administered 2012-04-23: 200 mg via INTRAVENOUS
  Administered 2012-04-23: 20 mg via INTRAVENOUS

## 2012-04-23 MED ORDER — LIDOCAINE HCL 1 % IJ SOLN
INTRAMUSCULAR | Status: DC | PRN
  Administered 2012-04-23: 2 mL via INTRADERMAL

## 2012-04-23 MED ORDER — CEPHALEXIN 500 MG PO CAPS: 500.0000 mg | ORAL_CAPSULE | Freq: Three times a day (TID) | ORAL | Status: AC

## 2012-04-23 MED ORDER — FENTANYL CITRATE 0.05 MG/ML IJ SOLN
50.0000 ug | INTRAMUSCULAR | Status: DC | PRN
  Administered 2012-04-23: 100 ug via INTRAVENOUS

## 2012-04-23 MED ORDER — IBUPROFEN 600 MG PO TABS: 600.0000 mg | ORAL_TABLET | Freq: Four times a day (QID) | ORAL | Status: AC | PRN

## 2012-04-23 MED ORDER — ROPIVACAINE HCL 5 MG/ML IJ SOLN
INTRAMUSCULAR | Status: DC | PRN
  Administered 2012-04-23: 25 mL via EPIDURAL

## 2012-04-23 MED ORDER — LIDOCAINE HCL (CARDIAC) 20 MG/ML IV SOLN
INTRAVENOUS | Status: DC | PRN
  Administered 2012-04-23: 50 mg via INTRAVENOUS

## 2012-04-23 SURGICAL SUPPLY — 71 items
BAG DECANTER FOR FLEXI CONT (MISCELLANEOUS) IMPLANT
BANDAGE ADHESIVE 1X3 (GAUZE/BANDAGES/DRESSINGS) IMPLANT
BANDAGE ELASTIC 3 VELCRO ST LF (GAUZE/BANDAGES/DRESSINGS) ×4 IMPLANT
BANDAGE GAUZE ELAST BULKY 4 IN (GAUZE/BANDAGES/DRESSINGS) ×2 IMPLANT
BIT DRILL 2 FAST STEP (BIT) ×2 IMPLANT
BIT DRILL 2.5X4 QC (BIT) ×2 IMPLANT
BLADE MINI RND TIP GREEN BEAV (BLADE) ×2 IMPLANT
BLADE SURG 15 STRL LF DISP TIS (BLADE) ×2 IMPLANT
BLADE SURG 15 STRL SS (BLADE) ×2
BNDG ESMARK 4X9 LF (GAUZE/BANDAGES/DRESSINGS) ×2 IMPLANT
BRUSH SCRUB EZ PLAIN DRY (MISCELLANEOUS) ×2 IMPLANT
CANISTER SUCTION 1200CC (MISCELLANEOUS) ×2 IMPLANT
CLOTH BEACON ORANGE TIMEOUT ST (SAFETY) ×2 IMPLANT
CORDS BIPOLAR (ELECTRODE) ×2 IMPLANT
COVER MAYO STAND STRL (DRAPES) ×2 IMPLANT
COVER TABLE BACK 60X90 (DRAPES) ×2 IMPLANT
CUFF TOURNIQUET SINGLE 18IN (TOURNIQUET CUFF) ×2 IMPLANT
DECANTER SPIKE VIAL GLASS SM (MISCELLANEOUS) IMPLANT
DRAPE EXTREMITY T 121X128X90 (DRAPE) ×2 IMPLANT
DRAPE OEC MINIVIEW 54X84 (DRAPES) ×2 IMPLANT
DRAPE SURG 17X23 STRL (DRAPES) ×2 IMPLANT
GAUZE XEROFORM 1X8 LF (GAUZE/BANDAGES/DRESSINGS) IMPLANT
GLOVE BIO SURGEON STRL SZ 6.5 (GLOVE) ×2 IMPLANT
GLOVE BIOGEL M STRL SZ7.5 (GLOVE) ×2 IMPLANT
GLOVE ECLIPSE 6.5 STRL STRAW (GLOVE) ×2 IMPLANT
GLOVE ORTHO TXT STRL SZ7.5 (GLOVE) ×2 IMPLANT
GOWN BRE IMP PREV XXLGXLNG (GOWN DISPOSABLE) ×4 IMPLANT
GOWN PREVENTION PLUS XLARGE (GOWN DISPOSABLE) ×2 IMPLANT
KWIRE 4.0 X .035IN (WIRE) ×4 IMPLANT
KWIRE 4.0 X .062IN (WIRE) ×2 IMPLANT
LOOP VESSEL MAXI BLUE (MISCELLANEOUS) IMPLANT
NEEDLE 27GAX1X1/2 (NEEDLE) IMPLANT
NS IRRIG 1000ML POUR BTL (IV SOLUTION) ×2 IMPLANT
PACK BASIN DAY SURGERY FS (CUSTOM PROCEDURE TRAY) ×2 IMPLANT
PAD CAST 3X4 CTTN HI CHSV (CAST SUPPLIES) ×2 IMPLANT
PADDING CAST ABS 4INX4YD NS (CAST SUPPLIES) ×1
PADDING CAST ABS COTTON 4X4 ST (CAST SUPPLIES) ×1 IMPLANT
PADDING CAST COTTON 3X4 STRL (CAST SUPPLIES) ×2
PEG SUBCHONDRAL SMOOTH 2.0X14 (Peg) ×2 IMPLANT
PEG SUBCHONDRAL SMOOTH 2.0X16 (Peg) ×4 IMPLANT
PEG SUBCHONDRAL SMOOTH 2.0X18 (Peg) ×2 IMPLANT
PEG SUBCHONDRAL SMOOTH 2.0X20 (Peg) ×4 IMPLANT
PLATE SHORT 21.6X48.9 NRRW LT (Plate) ×2 IMPLANT
SCREW BN 12X3.5XNS CORT TI (Screw) ×1 IMPLANT
SCREW BN 13X3.5XNS CORT TI (Screw) ×1 IMPLANT
SCREW CORT 3.5X10 LNG (Screw) ×2 IMPLANT
SCREW CORT 3.5X12 (Screw) ×1 IMPLANT
SCREW CORT 3.5X13 (Screw) ×1 IMPLANT
SLEEVE SCD COMPRESS KNEE MED (MISCELLANEOUS) ×2 IMPLANT
SPLINT PLASTER CAST XFAST 3X15 (CAST SUPPLIES) IMPLANT
SPLINT PLASTER XTRA FASTSET 3X (CAST SUPPLIES)
SPONGE GAUZE 4X4 12PLY (GAUZE/BANDAGES/DRESSINGS) IMPLANT
STOCKINETTE 4X48 STRL (DRAPES) ×2 IMPLANT
STRIP CLOSURE SKIN 1/2X4 (GAUZE/BANDAGES/DRESSINGS) ×2 IMPLANT
SUCTION FRAZIER TIP 10 FR DISP (SUCTIONS) IMPLANT
SUT ETHIBOND 3-0 V-5 (SUTURE) IMPLANT
SUT PROLENE 3 0 PS 2 (SUTURE) ×2 IMPLANT
SUT STEEL 0 (SUTURE) ×1
SUT STEEL 0 18XMFL TIE 17 (SUTURE) ×1 IMPLANT
SUT VIC AB 0 SH 27 (SUTURE) ×2 IMPLANT
SUT VIC AB 2-0 PS2 27 (SUTURE) IMPLANT
SUT VIC AB 3-0 FS2 27 (SUTURE) IMPLANT
SUT VIC AB 4-0 BRD 54 (SUTURE) IMPLANT
SYR 3ML 23GX1 SAFETY (SYRINGE) IMPLANT
SYR BULB 3OZ (MISCELLANEOUS) ×2 IMPLANT
SYR CONTROL 10ML LL (SYRINGE) IMPLANT
TOWEL OR 17X24 6PK STRL BLUE (TOWEL DISPOSABLE) ×2 IMPLANT
TRAY DSU PREP LF (CUSTOM PROCEDURE TRAY) ×2 IMPLANT
TUBE CONNECTING 20X1/4 (TUBING) ×2 IMPLANT
UNDERPAD 30X30 INCONTINENT (UNDERPADS AND DIAPERS) ×2 IMPLANT
WATER STERILE IRR 1000ML POUR (IV SOLUTION) IMPLANT

## 2012-04-23 NOTE — Progress Notes (Signed)
Assisted Dr. Frederick with left, ultrasound guided, supraclavicular block. Side rails up, monitors on throughout procedure. See vital signs in flow sheet. Tolerated Procedure well. 

## 2012-04-23 NOTE — Brief Op Note (Signed)
04/23/2012  6:14 PM  PATIENT:  Candice Guerrero  61 y.o. female  PRE-OPERATIVE DIAGNOSIS:  fracture left radius and ulnar styloid.  Comminuted fracture of pisiform  POST-OPERATIVE DIAGNOSIS:  fracture left radius and ulnar styloid.   Comminuted fracture of pisiform  PROCEDURE:   OPEN REDUCTION INTERNAL FIXATION (ORIF) DISTAL RADIAL FRACTURE,  ORIF OF ULNAR STYLOID AND EXCISION OF PISIFORM  SURGEON:   Wyn Forster., MD   PHYSICIAN ASSISTANT:   ASSISTANTS: Mallory Shirk.A-C   ANESTHESIA:   general  EBL:  Total I/O In: 1700 [I.V.:1700] Out: -   BLOOD ADMINISTERED:none  DRAINS: none   LOCAL MEDICATIONS USED:  PRE OP PLEXUS BLOCK (ROPIVICAINE)  SPECIMEN:  No Specimen  DISPOSITION OF SPECIMEN:  N/A  COUNTS:  YES  TOURNIQUET:   Total Tourniquet Time Documented: Upper Arm (Left) - 103 minutes  DICTATION: .Other Dictation: Dictation Number   PLAN OF CARE: Discharge to home after PACU  PATIENT DISPOSITION:  PACU - hemodynamically stable.

## 2012-04-23 NOTE — Anesthesia Postprocedure Evaluation (Signed)
  Anesthesia Post-op Note  Patient: Candice Guerrero  Procedure(s) Performed: Procedure(s) (LRB): OPEN REDUCTION INTERNAL FIXATION (ORIF) DISTAL RADIAL FRACTURE (Left)  Patient Location: PACU  Anesthesia Type: GA combined with regional for post-op pain  Level of Consciousness: awake, alert , oriented and patient cooperative  Airway and Oxygen Therapy: Patient Spontanous Breathing and Patient connected to nasal cannula oxygen  Post-op Pain: none  Post-op Assessment: Post-op Vital signs reviewed, Patient's Cardiovascular Status Stable, Respiratory Function Stable, Patent Airway, No signs of Nausea or vomiting and Pain level controlled  Post-op Vital Signs: stable  Complications: No apparent anesthesia complications

## 2012-04-23 NOTE — Anesthesia Procedure Notes (Addendum)
Anesthesia Regional Block:  Supraclavicular block  Pre-Anesthetic Checklist: ,, timeout performed, Correct Patient, Correct Site, Correct Laterality, Correct Procedure, Correct Position, site marked, Risks and benefits discussed,  Surgical consent,  Pre-op evaluation,  At surgeon's request and post-op pain management  Laterality: Left  Prep: chloraprep       Needles:   Needle Type: Other   (Arrow Echogenic)   Needle Length: 9cm  Needle Gauge: 21    Additional Needles:  Procedures: ultrasound guided Supraclavicular block Narrative:  Start time: 04/23/2012 1:58 PM End time: 04/23/2012 2:08 PM Injection made incrementally with aspirations every 5 mL.  Performed by: Personally  Anesthesiologist: C Frederick  Additional Notes: Ultrasound guidance used to: id relevant anatomy, confirm needle position, local anesthetic spread, avoidance of vascular puncture. Picture saved. No complications. Block performed personally by Janetta Hora. Gelene Mink, MD    Supraclavicular block Procedure Name: LMA Insertion Performed by: York Grice Pre-anesthesia Checklist: Patient identified, Timeout performed, Emergency Drugs available, Suction available and Patient being monitored Patient Re-evaluated:Patient Re-evaluated prior to inductionOxygen Delivery Method: Circle system utilized Preoxygenation: Pre-oxygenation with 100% oxygen Intubation Type: IV induction Ventilation: Mask ventilation without difficulty LMA: LMA inserted LMA Size: 4.0 Number of attempts: 1 Placement Confirmation: breath sounds checked- equal and bilateral and positive ETCO2 Tube secured with: Tape Dental Injury: Teeth and Oropharynx as per pre-operative assessment

## 2012-04-23 NOTE — Transfer of Care (Signed)
Immediate Anesthesia Transfer of Care Note  Patient: Candice Guerrero  Procedure(s) Performed: Procedure(s) (LRB): OPEN REDUCTION INTERNAL FIXATION (ORIF) DISTAL RADIAL FRACTURE (Left)  Patient Location: PACU  Anesthesia Type: General  Level of Consciousness: awake and alert   Airway & Oxygen Therapy: Patient Spontanous Breathing and Patient connected to face mask oxygen  Post-op Assessment: Report given to PACU RN and Post -op Vital signs reviewed and stable  Post vital signs: Reviewed and stable  Complications: No apparent anesthesia complications

## 2012-04-23 NOTE — Anesthesia Preprocedure Evaluation (Signed)
Anesthesia Evaluation  Patient identified by MRN, date of birth, ID band Patient awake    Reviewed: Allergy & Precautions, H&P , NPO status , Patient's Chart, lab work & pertinent test results, reviewed documented beta blocker date and time   Airway Mallampati: II TM Distance: >3 FB Neck ROM: full    Dental   Pulmonary neg pulmonary ROS,          Cardiovascular hypertension, On Medications     Neuro/Psych  Headaches, PSYCHIATRIC DISORDERS    GI/Hepatic negative GI ROS, Neg liver ROS, GERD-  Medicated and Controlled,  Endo/Other  Well Controlled, Type 2, Oral Hypoglycemic Agents  Renal/GU negative Renal ROS  negative genitourinary   Musculoskeletal   Abdominal   Peds  Hematology negative hematology ROS (+)   Anesthesia Other Findings See surgeon's H&P   Reproductive/Obstetrics negative OB ROS                           Anesthesia Physical Anesthesia Plan  ASA: II  Anesthesia Plan: General   Post-op Pain Management:    Induction: Intravenous  Airway Management Planned: LMA  Additional Equipment:   Intra-op Plan:   Post-operative Plan: Extubation in OR  Informed Consent: I have reviewed the patients History and Physical, chart, labs and discussed the procedure including the risks, benefits and alternatives for the proposed anesthesia with the patient or authorized representative who has indicated his/her understanding and acceptance.   Dental Advisory Given  Plan Discussed with: CRNA and Surgeon  Anesthesia Plan Comments:         Anesthesia Quick Evaluation

## 2012-04-24 NOTE — Op Note (Signed)
Candice Guerrero, Candice Guerrero                ACCOUNT NO.:  [id number]  MEDICAL RECORD NO.:  [id number]  LOCATION:                               FACILITY:  [facility]  PHYSICIAN:  Candice Guerrero, M.D. DATE OF BIRTH:  07-12-1951  DATE OF PROCEDURE:  04/23/2012 DATE OF DISCHARGE:  04/23/2012                              OPERATIVE REPORT   PREOPERATIVE DIAGNOSES: 1. Comminuted impacted fracture of right distal radius intraarticular     at radiocarpal articulation and distal radioulnar joint. 2. Significantly displaced ulnar styloid fracture at base leading to     instability of distal radioulnar joint. 3. Comminuted impacted pisiform fracture.  POSTOPERATIVE DIAGNOSES: 1. Comminuted impacted fracture of right distal radius intraarticular     at radiocarpal articulation and distal radioulnar joint. 2. Significantly displaced ulnar styloid fracture at base leading to     instability of distal radioulnar joint. 3. Comminuted impacted pisiform fracture.  OPERATION: 1. Open reduction and internal fixation of comminuted left distal     radius fracture with a narrow DVR volar plate. 2. Open reduction and internal fixation of left ulnar styloid base     fracture with 0.035-inch Kirschner wire fixation and tension band. 3. Resection of pisiform.  OPERATING SURGEON:  Candice Fitch. Kelyn Koskela, MD  ASSISTANT:  Candice Reeks Dasnoit, PA-C  ANESTHESIA:  General by LMA supplemented by a ropivacaine plexus block placed preoperatively by Dr. Chaney Malling.  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  She was seen at the [facility] in [place], [place] by the orthopedic physicians.  X- rays of her wrist were obtained documenting the aforementioned distal radius fracture, ulnar styloid fracture, and comminuted pisiform fracture.  Candice Guerrero was advised to seek an upper  extremity orthopedic consult for reconstruction of her wrist anticipating volar plate fixation of the distal radius, fixation of the ulnar styloid and probable resection of the pisiform.  She was referred by her primary care physician, Candice Guerrero to Orthopedic and Hand specialist for forearm and wrist reconstruction.  After informed consent, she was brought to the operating room at this time.  Preoperatively, we reminded her of the potential risks and benefits of surgery.  There was always a chance that hardware would need to be removed at a later date if it causes intolerance or unexpected consequence occurs such as fracture or loosening of hardware.  Preoperatively, questions were invited and answered in detail in the holding area.  PROCEDURE:  Candice Guerrero was brought to room #2 of the [facility] and placed in supine position on the operating table.  Dr. Chaney Malling had provided detailed anesthesia and informed consent in the holding area and placed ropivacaine brachial plexus block without complication.  Candice Guerrero was noted to have excellent anesthesia of the left arm.  In room #2 under Dr. Seward Meth direct supervision, general anesthesia by LMA technique was induced.  1 g of Ancef was administered as an IV prophylactic antibiotic.  The left arm was prepped with Betadine soap and solution, and sterilely draped.  Following exsanguination  of the left arm with an Esmarch bandage, the arterial tourniquet was inflated to 220 mmHg.  The procedure commenced with standard DVR volar zigzag incision exposing the flexor carpi radialis.  Flexor carpi radialis sheath was split and the flexor carpi radialis was retracted in an ulnar direction.  The flexor pollicis longus was retracted ulnarly and the pronator quadratus elevated off the fracture site.  The fracture was comminuted, impacted and involved articular fragments into the distal aspect of the radiocarpal  articulation in the distal radioulnar joint.  Due to relatively diminutive bones, we selected a 6-peg narrow DVR plate system.  This was placed in a slightly radial position so as to avoid any chance of impingement at the distal radioulnar joint.  The plate was applied with standard technique after anatomic reduction of the fragments under direct vision.  Satisfactory plate position was achieved with a proper sizing of the pegs and anatomic reduction of both articular surfaces.  The pronator quadratus was then repaired over the distal aspect of the plate with 0 Vicryl followed by thorough irrigation, and repair of the skin with subcutaneous 4-0 Vicryl with intradermal 3-0 Prolene. Attention was then directed to the ulnar aspect of the wrist.  A curvilinear incision was fashioned along the junction between the glabrous skin of the palm and the dorsal ulnar surface of the hand. This was extended proximally in a seagull manner to expose the ulnar styloid and distal 3 cm of the ulnar shaft.  With great care, the pisiform was identified and shelled out subperiosteally with the combination use of sharp osteotomes, scalpel and microrongeur.  Care was taken to identify the position of the ulnar artery and nerve prior to shelling out the pisiform.  The dead space created was closed with mattress suture of 3-0 Ethibond. Attention was then directed to the ulnar styloid fracture.  The shaft of the ulna was exposed with subperiosteal dissection followed by recovery of the styloid.  The hematoma was evacuated and bone fragments removed. The styloid was docked anatomically using a small Joseph skin hooks and two 0.035-inch Kirschner wires were drilled obliquely through the styloid into the distal metaphysis of the ulna with fluoroscopic control.  We then placed a double 26-gauge wire figure-of-eight tension band securing the styloid securely.  An air arthrogram was accomplished revealing an  intact triangular fibrocartilage with anatomic reconstruction.  The wound was thoroughly irrigated with sterile saline followed by repair of the skin with subcutaneous 4-0 Vicryl and intradermal 3-0 Prolene with Steri-Strips.  Ms. Landen was placed in a sugar-tong splint.  She was then awakened from general anesthesia, placed in the sling and transferred to the postanesthesia care unit for observation of her vital signs.  There were no apparent complications.  For aftercare, she will be provided prescriptions for Dilaudid 2 mg 1 or 2 tablets p.o. q.4-6 hours p.r.n. pain, 30 tablets without refill; Keflex 500 mg 1 p.o. q.8 hours x4 days as a prophylactic antibiotic; and Motrin 600 mg 1 p.o. q.6 hours p.r.n. pain.     Candice Fitch Ashyia Schraeder, M.D.     RVS/MEDQ  D:  04/23/2012  T:  04/24/2012  Job:  [id number]  cc:   Stacie Glaze, MD

## 2012-05-21 ENCOUNTER — Encounter: Payer: Self-pay | Admitting: Internal Medicine

## 2012-05-21 ENCOUNTER — Ambulatory Visit (INDEPENDENT_AMBULATORY_CARE_PROVIDER_SITE_OTHER): Payer: BC Managed Care – PPO | Admitting: Internal Medicine

## 2012-05-21 VITALS — BP 130/80 | HR 72 | Temp 98.2°F | Resp 16 | Ht 60.0 in | Wt 160.0 lb

## 2012-05-21 DIAGNOSIS — T887XXA Unspecified adverse effect of drug or medicament, initial encounter: Secondary | ICD-10-CM

## 2012-05-21 DIAGNOSIS — S52599A Other fractures of lower end of unspecified radius, initial encounter for closed fracture: Secondary | ICD-10-CM

## 2012-05-21 DIAGNOSIS — K589 Irritable bowel syndrome without diarrhea: Secondary | ICD-10-CM

## 2012-05-21 DIAGNOSIS — S52509A Unspecified fracture of the lower end of unspecified radius, initial encounter for closed fracture: Secondary | ICD-10-CM

## 2012-05-21 DIAGNOSIS — E785 Hyperlipidemia, unspecified: Secondary | ICD-10-CM

## 2012-05-21 LAB — HEPATIC FUNCTION PANEL
ALT: 37 U/L — ABNORMAL HIGH (ref 0–35)
AST: 33 U/L (ref 0–37)
Albumin: 4 g/dL (ref 3.5–5.2)
Alkaline Phosphatase: 81 U/L (ref 39–117)
Total Bilirubin: 0.5 mg/dL (ref 0.3–1.2)

## 2012-05-21 LAB — LIPID PANEL
HDL: 49.3 mg/dL (ref >39.00)
Triglycerides: 217 mg/dL — ABNORMAL HIGH (ref 0.0–149.0)

## 2012-05-21 MED ORDER — HYOSCYAMINE SULFATE 0.125 MG SL SUBL: 0.1250 mg | SUBLINGUAL_TABLET | SUBLINGUAL | Status: DC | PRN

## 2012-05-21 NOTE — Patient Instructions (Signed)
The patient is instructed to continue all medications as prescribed. Schedule followup with check out clerk upon leaving the clinic  

## 2012-05-21 NOTE — Progress Notes (Signed)
Subjective:    Patient ID: Candice Guerrero, female    DOB: 02-24-1951, 61 y.o.   MRN: [id number]  HPI  Pt fell  After tripping in stairs fracture of radius and stylus displacement that required surgical repair Follow up HTN which has been stable  and lipids Hx of hypertriglyceridemia   Review of Systems  Constitutional: Negative for activity change, appetite change and fatigue.  HENT: Negative for ear pain, congestion, neck pain, postnasal drip and sinus pressure.   Eyes: Negative for redness and visual disturbance.  Respiratory: Negative for cough, shortness of breath and wheezing.   Gastrointestinal: Negative for abdominal pain and abdominal distention.  Genitourinary: Negative for dysuria, frequency and menstrual problem.  Musculoskeletal: Negative for myalgias, joint swelling and arthralgias.  Skin: Negative for rash and wound.  Neurological: Negative for dizziness, weakness and headaches.  Hematological: Negative for adenopathy. Does not bruise/bleed easily.  Psychiatric/Behavioral: Negative for disturbed wake/sleep cycle and decreased concentration.   Past Medical History  Diagnosis Date  . Depression   . GERD (gastroesophageal reflux disease)   . Hyperlipidemia   . Psoriasis     arms, legs, back  . Headache     migraines, sinus  . Diabetes mellitus     NIDDM  . Hypertension     under control, has been on med. since 2005  . Benign hematuria   . Snores     denies sx. of sleep apnea  . Dental crowns present   . Cataract immature     left eye    History   Social History  . Marital Status: Married    Spouse Name: N/A    Number of Children: N/A  . Years of Education: N/A   Occupational History  . Not on file.   Social History Main Topics  . Smoking status: Former Games developer  . Smokeless tobacco: Never Used   Comment: quit smoking in 2000  . Alcohol Use: 0.0 oz/week     socially, weekends  . Drug Use: No  . Sexually Active: Yes   Other Topics Concern  . Not  on file   Social History Narrative  . No narrative on file    Past Surgical History  Procedure Date  . Dilation and curettage of uterus   . Tonsillectomy     Family History  Problem Relation Age of Onset  . Hyperlipidemia Mother   . Hypertension Mother   . Hypertension Father   . Kidney disease Father   . Anesthesia problems Sister     post-op N/V, hard to wake up post-op    Allergies  Allergen Reactions  . Hydrocodone-Acetaminophen Nausea And Vomiting    DIZZINESS  . Altace (Ramipril) Cough    Current Outpatient Prescriptions on File Prior to Visit  Medication Sig Dispense Refill  . aspirin 81 MG tablet Take 81 mg by mouth daily.        Marland Kitchen aspirin-acetaminophen-caffeine (EXCEDRIN MIGRAINE) 250-250-65 MG per tablet Take 1 tablet by mouth every 6 (six) hours as needed.        . calcipotriene-betamethasone (TACLONEX) ointment Apply topically daily.        . Calcium Carbonate (CALTRATE 600) 1500 MG TABS Take by mouth daily.        . cholecalciferol (VITAMIN D) 1000 UNITS tablet Take 1,000 Units by mouth daily.      . citalopram (CELEXA) 20 MG tablet Take 1 tablet (20 mg total) by mouth daily.  90 tablet  3  . ezetimibe-simvastatin (VYTORIN) 10-40  MG per tablet Take 1 tablet by mouth at bedtime.  90 tablet  3  . fish oil-omega-3 fatty acids 1000 MG capsule Take 2 g by mouth daily.        . metFORMIN (GLUCOPHAGE) 500 MG tablet Take 1 tablet (500 mg total) by mouth daily with breakfast.  90 tablet  3  . naproxen sodium (ANAPROX) 220 MG tablet Take 220 mg by mouth 2 (two) times daily with a meal.      . omeprazole (PRILOSEC) 40 MG capsule Take 1 capsule (40 mg total) by mouth daily.  90 capsule  3  . risedronate (ACTONEL) 150 MG tablet Take 1 tablet (150 mg total) by mouth every 30 (thirty) days. with water on empty stomach, nothing by mouth or lie down for next 30 minutes.  3 tablet  3  . valsartan-hydrochlorothiazide (DIOVAN-HCT) 80-12.5 MG per tablet Take 1 tablet by mouth  daily.  90 tablet  3    BP 130/80  Pulse 72  Temp 98.2 F (36.8 C)  Resp 16  Ht 5' (1.524 m)  Wt 160 lb (72.576 kg)  BMI 31.25 kg/m2        Objective:   Physical Exam  Nursing note and vitals reviewed. Constitutional: She is oriented to person, place, and time. She appears well-developed and well-nourished. No distress.  HENT:  Head: Normocephalic and atraumatic.  Right Ear: External ear normal.  Left Ear: External ear normal.  Nose: Nose normal.  Mouth/Throat: Oropharynx is clear and moist.  Eyes: Conjunctivae and EOM are normal. Pupils are equal, round, and reactive to light.  Neck: Normal range of motion. Neck supple. No JVD present. No tracheal deviation present. No thyromegaly present.  Cardiovascular: Normal rate, regular rhythm, normal heart sounds and intact distal pulses.   No murmur heard. Pulmonary/Chest: Effort normal and breath sounds normal. She has no wheezes. She exhibits no tenderness.  Abdominal: Soft. Bowel sounds are normal.  Musculoskeletal: She exhibits tenderness. She exhibits no edema.       Fracture after fall  Lymphadenopathy:    She has no cervical adenopathy.  Neurological: She is alert and oriented to person, place, and time. She has normal reflexes. No cranial nerve deficit.  Skin: Skin is warm and dry. She is not diaphoretic.  Psychiatric: She has a normal mood and affect. Her behavior is normal.          Assessment & Plan:  Stable fracture of distal radius with surgery for repair of a styloid displacement.  Patient is followed by orthopedists.  Patient's blood pressure stable on current medications  Patient has had elevated triglyceride levels with better control of her cholesterol last office visit a repeat cholesterol is due today for six-month interval  On vytorin 10/40  Refill of IBS medications per patient's request

## 2012-06-26 ENCOUNTER — Other Ambulatory Visit: Payer: Self-pay | Admitting: Orthopedic Surgery

## 2012-07-04 ENCOUNTER — Encounter (HOSPITAL_BASED_OUTPATIENT_CLINIC_OR_DEPARTMENT_OTHER): Payer: Self-pay | Admitting: *Deleted

## 2012-07-04 NOTE — Progress Notes (Signed)
Bring all medications. Coming in Monday BMET.

## 2012-07-04 NOTE — Progress Notes (Signed)
Spoke with [name] PA about pt stated she was to have two procedures on left hand--- trigger finger of left middle. [name] said they would check it the day of surgery and add to permit if they plan to do it.

## 2012-07-08 ENCOUNTER — Encounter (HOSPITAL_BASED_OUTPATIENT_CLINIC_OR_DEPARTMENT_OTHER)
Admission: RE | Admit: 2012-07-08 | Discharge: 2012-07-08 | Disposition: A | Discharge status: Home / Self Care | Payer: BC Managed Care – PPO | Source: Ambulatory Visit | Attending: Orthopedic Surgery | Admitting: Orthopedic Surgery

## 2012-07-08 LAB — BASIC METABOLIC PANEL
Calcium: 9.7 mg/dL (ref 8.4–10.5)
Chloride: 100 mEq/L (ref 96–112)
Creatinine, Ser: 0.71 mg/dL (ref 0.50–1.10)
GFR calc Af Amer: >90 mL/min (ref >90)
Sodium: 141 mEq/L (ref 135–145)

## 2012-07-10 NOTE — H&P (Signed)
Candice Guerrero is an 61 y.o. female.   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 She is left hand dominant. She was descending stairs at her son's home and fell nearly a full flight of stairs landing onto her left arm. She had immediate pain in her wrist and was seen at the [facility] in [place]. X-rays revealed an impacted comminuted fracture of her pisiform and a impacted fracture of her distal radius with an ulnar styloid fracture that was significantly displaced. She underwent ORIF of left distal radius, left ulnar styloid and pisiform resection.  We had her work on regaining her ROM. Recent x-rays revealed that healed her fracture and she is now ready for removal of her K-wires and tension band in addition to removal residual pisiform rim fragments and release A-1 pulley left long finger.  Past Medical History  Diagnosis Date  . Depression   . GERD (gastroesophageal reflux disease)   . Hyperlipidemia   . Psoriasis     arms, legs, back  . Headache     migraines, sinus  . Diabetes mellitus     NIDDM  . Hypertension     under control, has been on med. since 2005  . Benign hematuria   . Snores     denies sx. of sleep apnea  . Dental crowns present   . Cataract immature     left eye    Past Surgical History  Procedure Date  . Dilation and curettage of uterus   . Tonsillectomy   . Left wrist surgery     July 9th 2013    Family History  Problem Relation Age of Onset  . Hyperlipidemia Mother   . Hypertension Mother   . Hypertension Father   . Kidney disease Father   . Anesthesia problems Sister     post-op N/V, hard to wake up post-op   Social History:  reports that she has quit smoking. She has never used smokeless tobacco. She reports that she drinks alcohol. She reports that she does not use illicit  drugs.  Allergies:  Allergies  Allergen Reactions  . Hydrocodone-Acetaminophen Nausea And Vomiting    DIZZINESS  . Altace (Ramipril) Cough    No prescriptions prior to admission    No results found for this or any previous visit (from the past 48 hour(s)).  No results found.   Pertinent items are noted in HPI.  Height 5' (1.524 m), weight 72.576 kg (160 lb).  General appearance: alert Head: Normocephalic, without obvious abnormality Neck: supple, symmetrical, trachea midline Resp: clear to auscultation bilaterally Cardio: regular rate and rhythm GI: normal findings: bowel sounds normal Extremities:Exam of the left wrist reveals that the incisions have healed well.  Both fractures are well healed.  She is progressing well in therapy.  She does have some irritation over her ulnar pin.  She has  near full pronation and has only about 15 to 20 degrees of supination.  She has some mild stiffness of her fingers.  She has normal sweat pattern when compared with the opposite side and normal dermatoglyphics.   Pulses: 2+ and symmetric Skin: normal Neurologic: Grossly normal.      Assessment/Plan Impression: S/P ORIF left distal radius with debridement pisiform and chronic STS left long finger  Plan: To the OR for removal K-wires and tension band with debridement  pisiform rim fragments and release A-1 pulley left long finger.The procedure, risks,benefits and post-op course were discussed with the patient at length and they were in agreement with the plan.   DASNOIT,Elexis Pollak J 07/10/2012, 9:42 PM     H&P documentation: 07/11/2012  -History and Physical Reviewed  -Patient has been re-examined  -No change in the plan of care  Wyn Forster, MD

## 2012-07-11 ENCOUNTER — Encounter (HOSPITAL_BASED_OUTPATIENT_CLINIC_OR_DEPARTMENT_OTHER): Payer: Self-pay | Admitting: Anesthesiology

## 2012-07-11 ENCOUNTER — Encounter (HOSPITAL_BASED_OUTPATIENT_CLINIC_OR_DEPARTMENT_OTHER)
Admission: RE | Disposition: A | Discharge status: Home / Self Care | Payer: Self-pay | Source: Ambulatory Visit | Attending: Orthopedic Surgery

## 2012-07-11 ENCOUNTER — Encounter (HOSPITAL_BASED_OUTPATIENT_CLINIC_OR_DEPARTMENT_OTHER): Payer: Self-pay | Admitting: *Deleted

## 2012-07-11 ENCOUNTER — Ambulatory Visit (HOSPITAL_BASED_OUTPATIENT_CLINIC_OR_DEPARTMENT_OTHER)
Admission: RE | Admit: 2012-07-11 | Discharge: 2012-07-11 | Disposition: A | Discharge status: Home / Self Care | Payer: BC Managed Care – PPO | Source: Ambulatory Visit | Attending: Orthopedic Surgery | Admitting: Orthopedic Surgery

## 2012-07-11 ENCOUNTER — Ambulatory Visit (HOSPITAL_BASED_OUTPATIENT_CLINIC_OR_DEPARTMENT_OTHER): Payer: BC Managed Care – PPO | Admitting: Anesthesiology

## 2012-07-11 DIAGNOSIS — M65849 Other synovitis and tenosynovitis, unspecified hand: Secondary | ICD-10-CM | POA: Insufficient documentation

## 2012-07-11 DIAGNOSIS — E119 Type 2 diabetes mellitus without complications: Secondary | ICD-10-CM | POA: Insufficient documentation

## 2012-07-11 DIAGNOSIS — M65839 Other synovitis and tenosynovitis, unspecified forearm: Secondary | ICD-10-CM | POA: Insufficient documentation

## 2012-07-11 DIAGNOSIS — M6530 Trigger finger, unspecified finger: Secondary | ICD-10-CM | POA: Insufficient documentation

## 2012-07-11 DIAGNOSIS — Z472 Encounter for removal of internal fixation device: Secondary | ICD-10-CM | POA: Insufficient documentation

## 2012-07-11 DIAGNOSIS — I10 Essential (primary) hypertension: Secondary | ICD-10-CM | POA: Insufficient documentation

## 2012-07-11 HISTORY — PX: TRIGGER FINGER RELEASE: SHX641

## 2012-07-11 HISTORY — PX: HARDWARE REMOVAL: SHX979

## 2012-07-11 LAB — GLUCOSE, CAPILLARY: Glucose-Capillary: 98 mg/dL (ref 70–99)

## 2012-07-11 SURGERY — REMOVAL, HARDWARE
Anesthesia: General | Site: Wrist | Laterality: Left | Wound class: Clean

## 2012-07-11 MED ORDER — MEPERIDINE HCL 25 MG/ML IJ SOLN: 6.2500 mg | INTRAMUSCULAR | Status: DC | PRN

## 2012-07-11 MED ORDER — FENTANYL CITRATE 0.05 MG/ML IJ SOLN
INTRAMUSCULAR | Status: DC | PRN
  Administered 2012-07-11 (×2): 50 ug via INTRAVENOUS

## 2012-07-11 MED ORDER — LIDOCAINE HCL 2 % IJ SOLN
INTRAMUSCULAR | Status: DC | PRN
  Administered 2012-07-11: 5 mL

## 2012-07-11 MED ORDER — HYDROMORPHONE HCL PF 1 MG/ML IJ SOLN
0.2500 mg | INTRAMUSCULAR | Status: DC | PRN
  Administered 2012-07-11 (×2): 0.5 mg via INTRAVENOUS

## 2012-07-11 MED ORDER — CEFAZOLIN SODIUM-DEXTROSE 2-3 GM-% IV SOLR
2.0000 g | Freq: Once | INTRAVENOUS | Status: AC
  Administered 2012-07-11: 2 g via INTRAVENOUS

## 2012-07-11 MED ORDER — HYDROMORPHONE HCL 2 MG PO TABS: ORAL_TABLET | ORAL | Status: DC

## 2012-07-11 MED ORDER — MIDAZOLAM HCL 5 MG/5ML IJ SOLN
INTRAMUSCULAR | Status: DC | PRN
  Administered 2012-07-11: 1 mg via INTRAVENOUS

## 2012-07-11 MED ORDER — CEPHALEXIN 500 MG PO CAPS: 500.0000 mg | ORAL_CAPSULE | Freq: Three times a day (TID) | ORAL | Status: DC

## 2012-07-11 MED ORDER — MIDAZOLAM HCL 2 MG/2ML IJ SOLN: 0.5000 mg | Freq: Once | INTRAMUSCULAR | Status: DC | PRN

## 2012-07-11 MED ORDER — DEXAMETHASONE SODIUM PHOSPHATE 4 MG/ML IJ SOLN
INTRAMUSCULAR | Status: DC | PRN
  Administered 2012-07-11: 10 mg via INTRAVENOUS

## 2012-07-11 MED ORDER — PROPOFOL 10 MG/ML IV BOLUS
INTRAVENOUS | Status: DC | PRN
  Administered 2012-07-11: 200 mg via INTRAVENOUS

## 2012-07-11 MED ORDER — PROMETHAZINE HCL 25 MG/ML IJ SOLN: 6.2500 mg | INTRAMUSCULAR | Status: DC | PRN

## 2012-07-11 MED ORDER — METOCLOPRAMIDE HCL 5 MG/ML IJ SOLN
INTRAMUSCULAR | Status: DC | PRN
  Administered 2012-07-11: 10 mg via INTRAVENOUS

## 2012-07-11 MED ORDER — CHLORHEXIDINE GLUCONATE 4 % EX LIQD: 60.0000 mL | Freq: Once | CUTANEOUS | Status: DC

## 2012-07-11 MED ORDER — ONDANSETRON HCL 4 MG/2ML IJ SOLN
INTRAMUSCULAR | Status: DC | PRN
  Administered 2012-07-11: 4 mg via INTRAVENOUS

## 2012-07-11 MED ORDER — LACTATED RINGERS IV SOLN
INTRAVENOUS | Status: DC
  Administered 2012-07-11 (×2): via INTRAVENOUS

## 2012-07-11 SURGICAL SUPPLY — 47 items
BANDAGE ADHESIVE 1X3 (GAUZE/BANDAGES/DRESSINGS) IMPLANT
BANDAGE ELASTIC 3 VELCRO ST LF (GAUZE/BANDAGES/DRESSINGS) ×3 IMPLANT
BANDAGE GAUZE ELAST BULKY 4 IN (GAUZE/BANDAGES/DRESSINGS) ×3 IMPLANT
BLADE MINI RND TIP GREEN BEAV (BLADE) IMPLANT
BLADE SURG 15 STRL LF DISP TIS (BLADE) ×2 IMPLANT
BLADE SURG 15 STRL SS (BLADE) ×1
BNDG ELASTIC 2 VLCR STRL LF (GAUZE/BANDAGES/DRESSINGS) ×3 IMPLANT
BNDG ESMARK 4X9 LF (GAUZE/BANDAGES/DRESSINGS) ×3 IMPLANT
BRUSH SCRUB EZ PLAIN DRY (MISCELLANEOUS) ×3 IMPLANT
CLOTH BEACON ORANGE TIMEOUT ST (SAFETY) ×3 IMPLANT
CORDS BIPOLAR (ELECTRODE) ×3 IMPLANT
COVER MAYO STAND STRL (DRAPES) ×3 IMPLANT
COVER TABLE BACK 60X90 (DRAPES) ×3 IMPLANT
CUFF TOURNIQUET SINGLE 18IN (TOURNIQUET CUFF) ×3 IMPLANT
DECANTER SPIKE VIAL GLASS SM (MISCELLANEOUS) IMPLANT
DRAPE EXTREMITY T 121X128X90 (DRAPE) ×3 IMPLANT
DRAPE OEC MINIVIEW 54X84 (DRAPES) IMPLANT
DRAPE SURG 17X23 STRL (DRAPES) ×3 IMPLANT
GAUZE SPONGE 4X4 12PLY STRL LF (GAUZE/BANDAGES/DRESSINGS) ×6 IMPLANT
GAUZE XEROFORM 1X8 LF (GAUZE/BANDAGES/DRESSINGS) ×3 IMPLANT
GLOVE BIOGEL M STRL SZ7.5 (GLOVE) ×3 IMPLANT
GLOVE ORTHO TXT STRL SZ7.5 (GLOVE) ×3 IMPLANT
GOWN PREVENTION PLUS XLARGE (GOWN DISPOSABLE) ×3 IMPLANT
GOWN PREVENTION PLUS XXLARGE (GOWN DISPOSABLE) ×6 IMPLANT
GOWN STRL REIN XL XLG (GOWN DISPOSABLE) ×6 IMPLANT
NEEDLE 27GAX1X1/2 (NEEDLE) IMPLANT
NS IRRIG 1000ML POUR BTL (IV SOLUTION) ×3 IMPLANT
PACK BASIN DAY SURGERY FS (CUSTOM PROCEDURE TRAY) ×3 IMPLANT
PAD CAST 4YDX4 CTTN HI CHSV (CAST SUPPLIES) ×2 IMPLANT
PADDING CAST ABS 4INX4YD NS (CAST SUPPLIES) ×1
PADDING CAST ABS COTTON 4X4 ST (CAST SUPPLIES) ×2 IMPLANT
PADDING CAST COTTON 4X4 STRL (CAST SUPPLIES) ×1
SPLINT PLASTER CAST XFAST 3X15 (CAST SUPPLIES) IMPLANT
SPLINT PLASTER XTRA FASTSET 3X (CAST SUPPLIES)
SPONGE GAUZE 4X4 12PLY (GAUZE/BANDAGES/DRESSINGS) ×3 IMPLANT
STOCKINETTE 4X48 STRL (DRAPES) ×3 IMPLANT
STRIP CLOSURE SKIN 1/2X4 (GAUZE/BANDAGES/DRESSINGS) ×3 IMPLANT
SUT PROLENE 3 0 PS 2 (SUTURE) ×3 IMPLANT
SUT VIC AB 3-0 PS1 18 (SUTURE) ×1
SUT VIC AB 3-0 PS1 18XBRD (SUTURE) ×2 IMPLANT
SYR 3ML 23GX1 SAFETY (SYRINGE) IMPLANT
SYR BULB 3OZ (MISCELLANEOUS) ×3 IMPLANT
SYR CONTROL 10ML LL (SYRINGE) IMPLANT
TOWEL OR 17X24 6PK STRL BLUE (TOWEL DISPOSABLE) ×3 IMPLANT
TRAY DSU PREP LF (CUSTOM PROCEDURE TRAY) ×3 IMPLANT
UNDERPAD 30X30 INCONTINENT (UNDERPADS AND DIAPERS) ×3 IMPLANT
WATER STERILE IRR 1000ML POUR (IV SOLUTION) ×3 IMPLANT

## 2012-07-11 NOTE — Transfer of Care (Signed)
Immediate Anesthesia Transfer of Care Note  Patient: Candice Guerrero  Procedure(s) Performed: Procedure(s) (LRB) with comments: HARDWARE REMOVAL (Left) - pin removal wire out and debride pisiform RELEASE TRIGGER FINGER/A-1 PULLEY (Left) - LEFT LONG FINGER  Patient Location: PACU  Anesthesia Type: General  Level of Consciousness: awake, alert  and oriented  Airway & Oxygen Therapy: Patient Spontanous Breathing and Patient connected to face mask oxygen  Post-op Assessment: Report given to PACU RN and Post -op Vital signs reviewed and stable  Post vital signs: Reviewed and stable  Complications: No apparent anesthesia complications

## 2012-07-11 NOTE — Anesthesia Procedure Notes (Signed)
Procedure Name: LMA Insertion Date/Time: 07/11/2012 8:41 AM Performed by: Gar Gibbon Pre-anesthesia Checklist: Patient identified, Emergency Drugs available, Suction available and Patient being monitored Patient Re-evaluated:Patient Re-evaluated prior to inductionOxygen Delivery Method: Circle System Utilized Preoxygenation: Pre-oxygenation with 100% oxygen Intubation Type: IV induction Ventilation: Mask ventilation without difficulty LMA: LMA inserted LMA Size: 4.0 Number of attempts: 1 Airway Equipment and Method: bite block Placement Confirmation: positive ETCO2 Tube secured with: Tape Dental Injury: Teeth and Oropharynx as per pre-operative assessment

## 2012-07-11 NOTE — Op Note (Signed)
[id number] 

## 2012-07-11 NOTE — Anesthesia Preprocedure Evaluation (Signed)
Anesthesia Evaluation  Patient identified by MRN, date of birth, ID band Patient awake    Reviewed: Allergy & Precautions, H&P , NPO status , Patient's Chart, lab work & pertinent test results  History of Anesthesia Complications Negative for: history of anesthetic complications  Airway Mallampati: II TM Distance: >3 FB Neck ROM: Full    Dental No notable dental hx. (+) Teeth Intact and Dental Advisory Given   Pulmonary Recent URI , Resolved, former smoker,  breath sounds clear to auscultation  Pulmonary exam normal       Cardiovascular hypertension, Pt. on medications Rhythm:Regular Rate:Normal     Neuro/Psych PSYCHIATRIC DISORDERS Depression negative neurological ROS     GI/Hepatic Neg liver ROS, GERD-  Medicated and Controlled,  Endo/Other  diabetes (glu 98), Well Controlled, Type 2, Oral Hypoglycemic AgentsMorbid obesity  Renal/GU negative Renal ROS     Musculoskeletal   Abdominal (+) + obese,   Peds  Hematology   Anesthesia Other Findings   Reproductive/Obstetrics                           Anesthesia Physical Anesthesia Plan  ASA: III  Anesthesia Plan: General   Post-op Pain Management:    Induction: Intravenous  Airway Management Planned: LMA  Additional Equipment:   Intra-op Plan:   Post-operative Plan:   Informed Consent: I have reviewed the patients History and Physical, chart, labs and discussed the procedure including the risks, benefits and alternatives for the proposed anesthesia with the patient or authorized representative who has indicated his/her understanding and acceptance.   Dental advisory given  Plan Discussed with: CRNA and Surgeon  Anesthesia Plan Comments: (Plan routine monitors, GA- LMA OK)        Anesthesia Quick Evaluation

## 2012-07-11 NOTE — Brief Op Note (Signed)
07/11/2012  9:42 AM  PATIENT:  Candice Guerrero  61 y.o. female  PRE-OPERATIVE DIAGNOSIS:  status post open reduction internal fixation left ulnar and subtotal pisiform resection for comminuted fracture pisiform, trigger finger left long finger  POST-OPERATIVE DIAGNOSIS:  status post open reduction internal fixation left ulna and subtotal resection of pisiform, Trigger finger left long finger  PROCEDURE:  Procedure(s) (LRB) with comments: HARDWARE REMOVAL (Left) - pin removal wire out and debride pisiform RELEASE TRIGGER FINGER/A-1 PULLEY (Left) - LEFT LONG FINGER  SURGEON:  Surgeon(s) and Role:    * Wyn Forster., MD - Primary  PHYSICIAN ASSISTANT:   ASSISTANTS: Mallory Shirk.A-C   ANESTHESIA:   general  EBL:  Total I/O In: 1000 [I.V.:1000] Out: -   BLOOD ADMINISTERED:none  DRAINS: none   LOCAL MEDICATIONS USED:  XYLOCAINE   SPECIMEN:  No Specimen  DISPOSITION OF SPECIMEN:  N/A  COUNTS:  YES  TOURNIQUET:   Total Tourniquet Time Documented: Upper Arm (Left) - 44 minutes  DICTATION: .Other Dictation: Dictation Number [id number]  PLAN OF CARE: Discharge to home after PACU  PATIENT DISPOSITION:  PACU - hemodynamically stable.

## 2012-07-11 NOTE — Anesthesia Postprocedure Evaluation (Signed)
  Anesthesia Post-op Note  Patient: Candice Guerrero  Procedure(s) Performed: Procedure(s) (LRB) with comments: HARDWARE REMOVAL (Left) - pin removal wire out and debride pisiform RELEASE TRIGGER FINGER/A-1 PULLEY (Left) - LEFT LONG FINGER  Patient Location: PACU  Anesthesia Type: General  Level of Consciousness: awake, alert , oriented and patient cooperative  Airway and Oxygen Therapy: Patient Spontanous Breathing  Post-op Pain: none  Post-op Assessment: Post-op Vital signs reviewed, Patient's Cardiovascular Status Stable, Respiratory Function Stable, Patent Airway, No signs of Nausea or vomiting, Adequate PO intake and Pain level controlled  Post-op Vital Signs: Reviewed and stable  Complications: No apparent anesthesia complications

## 2012-07-12 ENCOUNTER — Encounter (HOSPITAL_BASED_OUTPATIENT_CLINIC_OR_DEPARTMENT_OTHER): Payer: Self-pay | Admitting: Orthopedic Surgery

## 2012-07-12 LAB — GLUCOSE, CAPILLARY: Glucose-Capillary: 99 mg/dL (ref 70–99)

## 2012-07-12 NOTE — Op Note (Signed)
NAMECHETARA, KROPP                ACCOUNT NO.:  [id number]  MEDICAL RECORD NO.:  [id number]  LOCATION:                                 FACILITY:  PHYSICIAN:  Katy Fitch. Wilmer Berryhill, M.D. DATE OF BIRTH:  27-Jun-1951  DATE OF PROCEDURE:  07/11/2012 DATE OF DISCHARGE:                              OPERATIVE REPORT   PREOPERATIVE DIAGNOSES: 1. Status post comminuted right distal radius fracture, ulnar styloid     avulsion with instability of distal radioulnar joint and comminuted     impacted pisiform fracture, status post open reduction and internal     fixation of radius ulna and partial resection of pisiform. 2. Left long finger stenosing tenosynovitis at A1 pulley.  POSTOPERATIVE DIAGNOSES: 1. Status post comminuted right distal radius fracture, ulnar styloid     avulsion with instability of distal radioulnar joint and comminuted     impacted pisiform fracture, status post open reduction and internal     fixation of radius ulna and partial resection of pisiform. 2. Left long finger stenosing tenosynovitis at A1 pulley.  OPERATIONS: 1. Removal of 2 retained Kirschner wires and a double Braid tension     band wire from the ulnar styloid open reduction and internal     fixation, followed by inspection of pisiform remnants and subtotal     removal of pisiform fragments. 2. Left long finger A1 pulley release.  OPERATING SURGEON:  Katy Fitch. Zillah Alexie, MD  ASSISTANT:  Marveen Reeks. Dasnoit, PA-C.  ANESTHESIA:  General by LMA.  SUPERVISING ANESTHESIOLOGIST:  Germaine Pomfret, MD.  INDICATIONS:  Dereonna Lensing is a 61 year old woman referred through the courtesy of Dr. Darryll Capers.  She had a very significant fall in July 2013 sustaining comminuted fracture of the right distal radius and ulnar styloid base fracture with instability of distal radioulnar joint and a very severely impacted comminuted pisotriquetral injury with comminution of the pisiform.  On April 23, 2012, we proceeded to  provide ORIF of her radius with a DVR plate system, ORIF of the ulna, and stabilization of distal radioulnar joint with tension band reconstruction, and Kirschner wire fixation of the styloid fracture and subtotal resection of the pisiform.  The pisiform is very comminuted and the triquetrum impacted.  Due to the proximity of the ulnar nerve and profound swelling, we removed the primary joint fragments at that time.  Postoperatively, Mr. Ashmore had some ulnar-sided wrist pain due to a bursa forming over her Kirschner wires.  Once we were assured that her ulnar fracture was healed, we recommended wire removal.  Due to the presence of pisiform fracture fragments that could be causing pressure on the triquetrum, we recommended debridement of the residual pisiform fragments to the best of our abilities.  Noting the proximity of the ulnar nerve, she understands that this will be a best effort.  After informed consent, she is brought to the operating room at this time.  PROCEDURE:  Ruth Kovich was brought to room #2 of the [facility] and placed in supine position on the operating table.  Following the induction of general anesthesia by LMA technique, 2 g of Ancef were administered as an IV prophylactic  antibiotic.  The left arm and hand were prepped with Betadine soap and solution, sterilely draped.  A pneumatic tourniquet was applied to the proximal brachium.  Following exsanguination of the left arm with an Esmarch bandage, the arterial tourniquet was inflated to 220 mmHg.  The procedure commenced with a routine surgical time-out noting the presence of a long trigger finger as well as the retained hardware and the pisiform fragments.  Procedure commenced with short oblique incision in the line of the long finger flexor sheath directly over the palpably thickened A1 pulley. Subcutaneous tissues were carefully divided taking care to release and retract the palmar fascia.   The A1 pulley was isolated, split with scalpel and scissors.  The flexor digitorum superficialis tendon was rather necrotic.  This was debrided to a smooth margin with a micro rongeur.  The tendon was delivered and found to be, otherwise, unimpeded.  Full passive range of motion of the long finger was recovered.  The wound was repaired with intradermal 3-0 Prolene and Steri-Strips. Attention was then directed to the distal ulna and pisiform region.  The prior surgical scar was carefully resected followed by dissection to reveal the dorsal branch of the ulnar nerve.  This was gently retracted with a Ragnell retractor followed by exposure of the bursa overlying the Kirschner wires.  The Kirschner wires were removed with a needle driver followed by identification of the twist of the tension band wire.  The wires removed segmentally taking care to not disturb the neurovascular structures.  A C-arm fluoroscope was used to confirm complete wire removal and a healed ulnar styloid fracture.  Distal dissection along the pisotriquetral joint revealed marked scarring of the capsule at the site of the previous injury.  We entered the pisotriquetral joint and noted some irregularity of the triquetrum articular surface.  The pisiform, ulnar, and distal fragments were removed with a subtendinous dissection with a 4-mm osteotome and gentle use of a rongeur.  There was so much scarring on the ulnar aspect of the pisiform, I simply removed the portion of the pisiform that was in direct contact with the triquetrum and left the fragments intact adjacent to the ulnar nerve so as to avoid inadvertent injury to the motor branch of the ulnar nerve.  After completion of the dissection, there appeared to be no fragments that would articulate against the triquetrum; therefore in my judgment, it was safer to leave a few fragments in the flexor carpi ulnaris rather than risk possible nerve injury.  The wound  was carefully examined for bleeding points followed by repair of the capsule with mattress suture of 3-0 Vicryl.  Subcutaneous tissues was repaired with 3-0 Vicryl followed by intradermal 3-0 Prolene repair. There were no apparent complications.  There were no apparent complications.  Ms. Doutt was placed in a compressive dressing.  The wounds were anesthetized with 2% lidocaine for postoperative analgesia.  A volar plaster splint was applied to provide postoperative comfort.     Katy Fitch Kaysee Hergert, M.D.     RVS/MEDQ  D:  07/11/2012  T:  07/12/2012  Job:  [id number]  cc:   Stacie Glaze, MD

## 2012-08-13 ENCOUNTER — Ambulatory Visit (INDEPENDENT_AMBULATORY_CARE_PROVIDER_SITE_OTHER): Payer: BC Managed Care – PPO | Admitting: Internal Medicine

## 2012-08-13 DIAGNOSIS — Z23 Encounter for immunization: Secondary | ICD-10-CM

## 2012-08-25 ENCOUNTER — Other Ambulatory Visit: Payer: Self-pay | Admitting: Internal Medicine

## 2012-08-27 ENCOUNTER — Other Ambulatory Visit: Payer: Self-pay | Admitting: Internal Medicine

## 2012-09-24 ENCOUNTER — Other Ambulatory Visit: Payer: Self-pay | Admitting: Internal Medicine

## 2012-11-08 ENCOUNTER — Other Ambulatory Visit: Payer: Self-pay | Admitting: Internal Medicine

## 2012-11-14 ENCOUNTER — Other Ambulatory Visit: Payer: BC Managed Care – PPO

## 2012-11-21 ENCOUNTER — Encounter: Payer: BC Managed Care – PPO | Admitting: Internal Medicine

## 2012-12-30 ENCOUNTER — Other Ambulatory Visit: Payer: Self-pay | Admitting: Internal Medicine

## 2013-01-16 ENCOUNTER — Other Ambulatory Visit: Payer: Self-pay | Admitting: Internal Medicine

## 2013-02-17 ENCOUNTER — Other Ambulatory Visit (INDEPENDENT_AMBULATORY_CARE_PROVIDER_SITE_OTHER): Payer: BC Managed Care – PPO

## 2013-02-17 DIAGNOSIS — Z0000 Encounter for general adult medical examination without abnormal findings: Secondary | ICD-10-CM

## 2013-02-17 LAB — POCT URINALYSIS DIPSTICK
Nitrite, UA: NEGATIVE
Spec Grav, UA: 1.015
Urobilinogen, UA: 0.2
pH, UA: 7

## 2013-02-17 LAB — CBC WITH DIFFERENTIAL/PLATELET
Basophils Absolute: 0 10*3/uL (ref 0.0–0.1)
Eosinophils Absolute: 0.1 10*3/uL (ref 0.0–0.7)
Lymphocytes Relative: 32.9 % (ref 12.0–46.0)
MCHC: 34.2 g/dL (ref 30.0–36.0)
Monocytes Absolute: 0.8 10*3/uL (ref 0.1–1.0)
Neutrophils Relative %: 53.6 % (ref 43.0–77.0)
Platelets: 329 10*3/uL (ref 150.0–400.0)
RBC: 4.9 Mil/uL (ref 3.87–5.11)
RDW: 12.8 % (ref 11.5–14.6)

## 2013-02-17 LAB — MICROALBUMIN / CREATININE URINE RATIO
Creatinine,U: 44.1 mg/dL
Microalb, Ur: 0.8 mg/dL (ref 0.0–1.9)

## 2013-02-18 LAB — BASIC METABOLIC PANEL
Chloride: 101 mEq/L (ref 96–112)
GFR: 64.51 mL/min (ref >60.00)
Potassium: 4.1 mEq/L (ref 3.5–5.1)
Sodium: 138 mEq/L (ref 135–145)

## 2013-02-18 LAB — LIPID PANEL
Cholesterol: 296 mg/dL — ABNORMAL HIGH (ref 0–200)
Total CHOL/HDL Ratio: 8
Triglycerides: 216 mg/dL — ABNORMAL HIGH (ref 0.0–149.0)
VLDL: 43.2 mg/dL — ABNORMAL HIGH (ref 0.0–40.0)

## 2013-02-18 LAB — LDL CHOLESTEROL, DIRECT: Direct LDL: 226.9 mg/dL

## 2013-02-18 LAB — HEPATIC FUNCTION PANEL
ALT: 63 U/L — ABNORMAL HIGH (ref 0–35)
AST: 41 U/L — ABNORMAL HIGH (ref 0–37)
Bilirubin, Direct: 0.1 mg/dL (ref 0.0–0.3)
Total Bilirubin: 0.7 mg/dL (ref 0.3–1.2)
Total Protein: 7.6 g/dL (ref 6.0–8.3)

## 2013-02-24 ENCOUNTER — Ambulatory Visit (INDEPENDENT_AMBULATORY_CARE_PROVIDER_SITE_OTHER): Payer: BC Managed Care – PPO | Admitting: Internal Medicine

## 2013-02-24 ENCOUNTER — Encounter: Payer: Self-pay | Admitting: Internal Medicine

## 2013-02-24 VITALS — BP 130/80 | HR 72 | Temp 98.2°F | Resp 16 | Ht 60.0 in | Wt 161.0 lb

## 2013-02-24 DIAGNOSIS — E1169 Type 2 diabetes mellitus with other specified complication: Secondary | ICD-10-CM

## 2013-02-24 DIAGNOSIS — Z23 Encounter for immunization: Secondary | ICD-10-CM

## 2013-02-24 DIAGNOSIS — IMO0002 Reserved for concepts with insufficient information to code with codable children: Secondary | ICD-10-CM

## 2013-02-24 DIAGNOSIS — E1165 Type 2 diabetes mellitus with hyperglycemia: Secondary | ICD-10-CM

## 2013-02-24 DIAGNOSIS — Z0000 Encounter for general adult medical examination without abnormal findings: Secondary | ICD-10-CM

## 2013-02-24 NOTE — Progress Notes (Signed)
Subjective:    Patient ID: Candice Guerrero, female    DOB: 04-Jul-1951, 62 y.o.   MRN: [id number]  HPI CPX AODM< HTN and LIPIDs  Hematuria with history of a complete work up in the past and has diagnosis of benigh hematuria    Review of Systems  Constitutional: Negative for activity change, appetite change and fatigue.  HENT: Negative for ear pain, congestion, neck pain, postnasal drip and sinus pressure.   Eyes: Negative for redness and visual disturbance.  Respiratory: Negative for cough, shortness of breath and wheezing.   Gastrointestinal: Negative for abdominal pain and abdominal distention.  Genitourinary: Negative for dysuria, frequency and menstrual problem.  Musculoskeletal: Negative for myalgias, joint swelling and arthralgias.  Skin: Negative for rash and wound.  Neurological: Negative for dizziness, weakness and headaches.  Hematological: Negative for adenopathy. Does not bruise/bleed easily.  Psychiatric/Behavioral: Negative for sleep disturbance and decreased concentration.   Past Medical History  Diagnosis Date  . Depression   . GERD (gastroesophageal reflux disease)   . Hyperlipidemia   . Psoriasis     arms, legs, back  . Headache     migraines, sinus  . Diabetes mellitus     NIDDM  . Hypertension     under control, has been on med. since 2005  . Benign hematuria   . Snores     denies sx. of sleep apnea  . Dental crowns present   . Cataract immature     left eye    History   Social History  . Marital Status: Married    Spouse Name: N/A    Number of Children: N/A  . Years of Education: N/A   Occupational History  . Not on file.   Social History Main Topics  . Smoking status: Former Games developer  . Smokeless tobacco: Never Used     Comment: quit smoking in 2000  . Alcohol Use: 0.0 oz/week     Comment: socially, weekends  . Drug Use: No  . Sexually Active: Yes   Other Topics Concern  . Not on file   Social History Narrative  . No narrative on  file    Past Surgical History  Procedure Laterality Date  . Dilation and curettage of uterus    . Tonsillectomy    . Left wrist surgery      July 9th 2013  . Hardware removal  07/11/2012    Procedure: HARDWARE REMOVAL;  Surgeon: Wyn Forster., MD;  Location: [facility];  Service: Orthopedics;  Laterality: Left;  pin removal wire out and debride pisiform  . Trigger finger release  07/11/2012    Procedure: RELEASE TRIGGER FINGER/A-1 PULLEY;  Surgeon: Wyn Forster., MD;  Location: [facility];  Service: Orthopedics;  Laterality: Left;  LEFT LONG FINGER    Family History  Problem Relation Age of Onset  . Hyperlipidemia Mother   . Hypertension Mother   . Hypertension Father   . Kidney disease Father   . Anesthesia problems Sister     post-op N/V, hard to wake up post-op    Allergies  Allergen Reactions  . Hydrocodone-Acetaminophen Nausea And Vomiting    DIZZINESS  . Altace (Ramipril) Cough    Current Outpatient Prescriptions on File Prior to Visit  Medication Sig Dispense Refill  . ACTONEL 150 MG tablet TAKE 1 TABLET EVERY 30 DAYS WITH WATER ON AN EMPTY STOMACH, DON'T TAKE ANYTHING BY MOUTH OR LIE DOWN FOR THE NEXT 30 MINUTES  1  tablet  6  . aspirin 81 MG tablet Take 81 mg by mouth daily.        Marland Kitchen aspirin-acetaminophen-caffeine (EXCEDRIN MIGRAINE) 250-250-65 MG per tablet Take 1 tablet by mouth every 6 (six) hours as needed.        . calcipotriene-betamethasone (TACLONEX) ointment Apply topically daily.        . Calcium Carbonate (CALTRATE 600) 1500 MG TABS Take by mouth daily.        . citalopram (CELEXA) 20 MG tablet TAKE 1 TABLET DAILY  90 tablet  0  . ezetimibe-simvastatin (VYTORIN) 10-40 MG per tablet Take 1 tablet by mouth at bedtime.  90 tablet  3  . metFORMIN (GLUCOPHAGE) 500 MG tablet Take 1 tablet (500 mg total) by mouth daily with breakfast.  90 tablet  3  . mometasone (NASONEX) 50 MCG/ACT nasal spray Place 2 sprays into the nose  daily.      . naproxen sodium (ANAPROX) 220 MG tablet Take 220 mg by mouth 2 (two) times daily with a meal.      . omeprazole (PRILOSEC) 40 MG capsule TAKE 1 CAPSULE BY MOUTH DAILY  90 capsule  0  . valsartan-hydrochlorothiazide (DIOVAN-HCT) 80-12.5 MG per tablet TAKE 1 TABLET DAILY  90 tablet  1  . hyoscyamine (LEVSIN/SL) 0.125 MG SL tablet Place 1 tablet (0.125 mg total) under the tongue every 4 (four) hours as needed for cramping.  90 tablet  3   No current facility-administered medications on file prior to visit.    BP 130/80  Pulse 72  Temp(Src) 98.2 F (36.8 C)  Resp 16  Ht 5' (1.524 m)  Wt 161 lb (73.029 kg)  BMI 31.44 kg/m2       Objective:   Physical Exam  Vitals reviewed. Constitutional: She is oriented to person, place, and time. She appears well-developed and well-nourished. No distress.  HENT:  Head: Normocephalic and atraumatic.  Right Ear: External ear normal.  Left Ear: External ear normal.  Nose: Nose normal.  Mouth/Throat: Oropharynx is clear and moist.  Eyes: Conjunctivae and EOM are normal. Pupils are equal, round, and reactive to light.  Neck: Normal range of motion. Neck supple. No JVD present. No tracheal deviation present. No thyromegaly present.  Cardiovascular: Normal rate, regular rhythm, normal heart sounds and intact distal pulses.   No murmur heard. Pulmonary/Chest: Effort normal and breath sounds normal. She has no wheezes. She exhibits no tenderness.  Abdominal: Soft. Bowel sounds are normal.  Musculoskeletal: Normal range of motion. She exhibits no edema and no tenderness.  Lymphadenopathy:    She has no cervical adenopathy.  Neurological: She is alert and oriented to person, place, and time. She has normal reflexes. No cranial nerve deficit.  Skin: Skin is warm and dry. She is not diaphoretic.  Psychiatric: She has a normal mood and affect. Her behavior is normal.          Assessment & Plan:   This is a routine physical examination  for this healthy  Female. Reviewed all health maintenance protocols including mammography colonoscopy bone density and reviewed appropriate screening labs. Her immunization history was reviewed as well as her current medications and allergies refills of her chronic medications were given and the plan for yearly health maintenance was discussed all orders and referrals were made as appropriate.   Patient discontinued her statin because she thought that there might be a side effect between the way she was feeling and a statin.  The myalgias have not changed  with the cessation of the statin and significant increase in cholesterol is seen as well as liver functions do to fatty infiltration of the liver.  We have urged her to go back on the cholesterol medications.  On a positive note her diabetes appears to be stable better control her weight is stable her blood pressure stable

## 2013-02-24 NOTE — Patient Instructions (Signed)
The patient is instructed to continue all medications as prescribed. Schedule followup with check out clerk upon leaving the clinic  

## 2013-02-25 ENCOUNTER — Other Ambulatory Visit: Payer: Self-pay | Admitting: *Deleted

## 2013-03-01 ENCOUNTER — Other Ambulatory Visit: Payer: Self-pay | Admitting: Internal Medicine

## 2013-03-14 ENCOUNTER — Other Ambulatory Visit: Payer: Self-pay | Admitting: Internal Medicine

## 2013-03-21 ENCOUNTER — Telehealth: Payer: Self-pay | Admitting: Internal Medicine

## 2013-03-21 DIAGNOSIS — E785 Hyperlipidemia, unspecified: Secondary | ICD-10-CM

## 2013-03-21 MED ORDER — EZETIMIBE-SIMVASTATIN 10-40 MG PO TABS: 1.0000 | ORAL_TABLET | Freq: Every day | ORAL | Status: DC

## 2013-03-21 NOTE — Telephone Encounter (Signed)
Pt called to request a refill of her ezetimibe-simvastatin (VYTORIN) 10-40 MG per tablet, in a 3 month supply. [facility] told her that they have a "new" fax number that they would like her to give to [place] [phone]. Please assist.

## 2013-04-01 ENCOUNTER — Other Ambulatory Visit: Payer: Self-pay

## 2013-04-01 DIAGNOSIS — Z1231 Encounter for screening mammogram for malignant neoplasm of breast: Secondary | ICD-10-CM

## 2013-05-07 ENCOUNTER — Ambulatory Visit: Payer: BC Managed Care – PPO

## 2013-05-14 ENCOUNTER — Ambulatory Visit
Admission: RE | Admit: 2013-05-14 | Discharge: 2013-05-14 | Disposition: A | Discharge status: Home / Self Care | Payer: BC Managed Care – PPO | Source: Ambulatory Visit

## 2013-05-14 DIAGNOSIS — Z1231 Encounter for screening mammogram for malignant neoplasm of breast: Secondary | ICD-10-CM

## 2013-05-24 ENCOUNTER — Other Ambulatory Visit: Payer: Self-pay | Admitting: Internal Medicine

## 2013-06-13 ENCOUNTER — Other Ambulatory Visit: Payer: Self-pay | Admitting: Internal Medicine

## 2013-06-23 ENCOUNTER — Other Ambulatory Visit (INDEPENDENT_AMBULATORY_CARE_PROVIDER_SITE_OTHER): Payer: BC Managed Care – PPO

## 2013-06-23 DIAGNOSIS — IMO0002 Reserved for concepts with insufficient information to code with codable children: Secondary | ICD-10-CM

## 2013-06-23 DIAGNOSIS — E1165 Type 2 diabetes mellitus with hyperglycemia: Secondary | ICD-10-CM

## 2013-06-23 LAB — HEPATIC FUNCTION PANEL
ALT: 45 U/L — ABNORMAL HIGH (ref 0–35)
Albumin: 4 g/dL (ref 3.5–5.2)
Bilirubin, Direct: 0 mg/dL (ref 0.0–0.3)
Total Protein: 7.2 g/dL (ref 6.0–8.3)

## 2013-06-23 LAB — LIPID PANEL
Cholesterol: 167 mg/dL (ref 0–200)
HDL: 44.6 mg/dL (ref >39.00)
LDL Cholesterol: 90 mg/dL (ref 0–99)
VLDL: 32.2 mg/dL (ref 0.0–40.0)

## 2013-06-23 LAB — HEMOGLOBIN A1C: Hgb A1c MFr Bld: 6.9 % — ABNORMAL HIGH (ref 4.6–6.5)

## 2013-06-30 ENCOUNTER — Ambulatory Visit (INDEPENDENT_AMBULATORY_CARE_PROVIDER_SITE_OTHER): Payer: BC Managed Care – PPO | Admitting: Internal Medicine

## 2013-06-30 ENCOUNTER — Encounter: Payer: Self-pay | Admitting: Internal Medicine

## 2013-06-30 VITALS — BP 140/80 | HR 80 | Temp 98.2°F | Resp 16 | Ht 60.0 in | Wt 166.0 lb

## 2013-06-30 DIAGNOSIS — I152 Hypertension secondary to endocrine disorders: Secondary | ICD-10-CM

## 2013-06-30 DIAGNOSIS — E119 Type 2 diabetes mellitus without complications: Secondary | ICD-10-CM

## 2013-06-30 DIAGNOSIS — I10 Essential (primary) hypertension: Secondary | ICD-10-CM

## 2013-06-30 DIAGNOSIS — Z23 Encounter for immunization: Secondary | ICD-10-CM

## 2013-06-30 DIAGNOSIS — E1159 Type 2 diabetes mellitus with other circulatory complications: Secondary | ICD-10-CM

## 2013-06-30 DIAGNOSIS — E785 Hyperlipidemia, unspecified: Secondary | ICD-10-CM

## 2013-06-30 DIAGNOSIS — E1169 Type 2 diabetes mellitus with other specified complication: Secondary | ICD-10-CM

## 2013-06-30 NOTE — Progress Notes (Signed)
Subjective:    Patient ID: Candice Guerrero, female    DOB: 09/22/1951, 62 y.o.   MRN: [id number]  HPI  Follow up for lipids, HTN and DM Labs were obtained prior to vist per protocol Monitoring lipid and liver revealed moderate liver enzuyme elevations  Review of Systems  Constitutional: Negative for activity change, appetite change and fatigue.  HENT: Negative for ear pain, congestion, neck pain, postnasal drip and sinus pressure.   Eyes: Negative for redness and visual disturbance.  Respiratory: Negative for cough, shortness of breath and wheezing.   Gastrointestinal: Negative for abdominal pain and abdominal distention.  Genitourinary: Negative for dysuria, frequency and menstrual problem.  Musculoskeletal: Negative for myalgias, joint swelling and arthralgias.  Skin: Negative for rash and wound.  Neurological: Negative for dizziness, weakness and headaches.  Hematological: Negative for adenopathy. Does not bruise/bleed easily.  Psychiatric/Behavioral: Negative for sleep disturbance and decreased concentration.   Past Medical History  Diagnosis Date  . Depression   . GERD (gastroesophageal reflux disease)   . Hyperlipidemia   . Psoriasis     arms, legs, back  . Headache(784.0)     migraines, sinus  . Diabetes mellitus     NIDDM  . Hypertension     under control, has been on med. since 2005  . Benign hematuria   . Snores     denies sx. of sleep apnea  . Dental crowns present   . Cataract immature     left eye    History   Social History  . Marital Status: Married    Spouse Name: N/A    Number of Children: N/A  . Years of Education: N/A   Occupational History  . Not on file.   Social History Main Topics  . Smoking status: Former Games developer  . Smokeless tobacco: Never Used     Comment: quit smoking in 2000  . Alcohol Use: 0.0 oz/week     Comment: socially, weekends  . Drug Use: No  . Sexual Activity: Yes   Other Topics Concern  . Not on file   Social History  Narrative  . No narrative on file    Past Surgical History  Procedure Laterality Date  . Dilation and curettage of uterus    . Tonsillectomy    . Left wrist surgery      July 9th 2013  . Hardware removal  07/11/2012    Procedure: HARDWARE REMOVAL;  Surgeon: Wyn Forster., MD;  Location: [facility];  Service: Orthopedics;  Laterality: Left;  pin removal wire out and debride pisiform  . Trigger finger release  07/11/2012    Procedure: RELEASE TRIGGER FINGER/A-1 PULLEY;  Surgeon: Wyn Forster., MD;  Location: [facility];  Service: Orthopedics;  Laterality: Left;  LEFT LONG FINGER    Family History  Problem Relation Age of Onset  . Hyperlipidemia Mother   . Hypertension Mother   . Hypertension Father   . Kidney disease Father   . Anesthesia problems Sister     post-op N/V, hard to wake up post-op    Allergies  Allergen Reactions  . Hydrocodone-Acetaminophen Nausea And Vomiting    DIZZINESS  . Altace [Ramipril] Cough    Current Outpatient Prescriptions on File Prior to Visit  Medication Sig Dispense Refill  . aspirin 81 MG tablet Take 81 mg by mouth daily.        Marland Kitchen aspirin-acetaminophen-caffeine (EXCEDRIN MIGRAINE) 250-250-65 MG per tablet Take 1 tablet by mouth every  6 (six) hours as needed.        . calcipotriene-betamethasone (TACLONEX) ointment Apply topically daily.        . Calcium Carbonate (CALTRATE 600) 1500 MG TABS Take by mouth daily.        . citalopram (CELEXA) 20 MG tablet TAKE 1 TABLET DAILY  90 tablet  3  . ezetimibe-simvastatin (VYTORIN) 10-40 MG per tablet Take 1 tablet by mouth at bedtime.  90 tablet  3  . metFORMIN (GLUCOPHAGE) 500 MG tablet TAKE 1 TABLET BY MOUTH DAILY WITH BREAKFAST  90 tablet  1  . mometasone (NASONEX) 50 MCG/ACT nasal spray Place 2 sprays into the nose daily.      . naproxen sodium (ANAPROX) 220 MG tablet Take 220 mg by mouth 2 (two) times daily with a meal.      . omeprazole (PRILOSEC) 40 MG  capsule TAKE 1 CAPSULE DAILY  90 capsule  3  . pseudoephedrine-codeine-guaifenesin (MYTUSSIN DAC) 30-10-100 MG/5ML solution Take 10 mLs by mouth 4 (four) times daily as needed for cough.      . valsartan-hydrochlorothiazide (DIOVAN-HCT) 80-12.5 MG per tablet TAKE 1 TABLET DAILY  90 tablet  3  . hyoscyamine (LEVSIN/SL) 0.125 MG SL tablet Place 1 tablet (0.125 mg total) under the tongue every 4 (four) hours as needed for cramping.  90 tablet  3   No current facility-administered medications on file prior to visit.    BP 140/80  Pulse 80  Temp(Src) 98.2 F (36.8 C)  Resp 16  Ht 5' (1.524 m)  Wt 166 lb (75.297 kg)  BMI 32.42 kg/m2       Objective:   Physical Exam  Constitutional: She is oriented to person, place, and time. She appears well-developed and well-nourished. No distress.  HENT:  Head: Normocephalic and atraumatic.  Right Ear: External ear normal.  Left Ear: External ear normal.  Nose: Nose normal.  Mouth/Throat: Oropharynx is clear and moist.  Eyes: Conjunctivae and EOM are normal. Pupils are equal, round, and reactive to light.  Neck: Normal range of motion. Neck supple. No JVD present. No tracheal deviation present. No thyromegaly present.  Cardiovascular: Normal rate, regular rhythm, normal heart sounds and intact distal pulses.   No murmur heard. Pulmonary/Chest: Effort normal and breath sounds normal. She has no wheezes. She exhibits no tenderness.  Abdominal: Soft. Bowel sounds are normal.  Musculoskeletal: Normal range of motion. She exhibits no edema and no tenderness.  Lymphadenopathy:    She has no cervical adenopathy.  Neurological: She is alert and oriented to person, place, and time. She has normal reflexes. No cranial nerve deficit.  Skin: Skin is warm and dry. She is not diaphoretic.  Psychiatric: She has a normal mood and affect. Her behavior is normal.          Assessment & Plan:  Stable liver functions Stable liver fatty infiltration A1c 6.9   Diet reveiwed and opportunity assessed walking

## 2013-06-30 NOTE — Patient Instructions (Signed)
The patient is instructed to continue all medications as prescribed. Schedule followup with check out clerk upon leaving the clinic  

## 2013-07-22 ENCOUNTER — Other Ambulatory Visit: Payer: Self-pay | Admitting: Internal Medicine

## 2013-10-20 ENCOUNTER — Other Ambulatory Visit: Payer: Self-pay | Admitting: *Deleted

## 2013-10-20 ENCOUNTER — Telehealth: Payer: Self-pay | Admitting: Internal Medicine

## 2013-10-20 MED ORDER — PSEUDOEPHEDRINE-CODEINE-GG 30-10-100 MG/5ML PO SOLN: 10.0000 mL | Freq: Four times a day (QID) | ORAL | Status: DC | PRN

## 2013-10-20 NOTE — Telephone Encounter (Signed)
Done

## 2013-10-20 NOTE — Telephone Encounter (Signed)
May  rx but I believe she will have to have a written on Could call in [place]

## 2013-10-20 NOTE — Telephone Encounter (Signed)
Patient called stating she would like a prescription for  pseudoephedrine-codeine-guaifenesin (MYTUSSIN DAC) 30-10-100 MG/5ML solution Please advise

## 2013-10-21 ENCOUNTER — Other Ambulatory Visit: Payer: Self-pay | Admitting: *Deleted

## 2013-11-13 ENCOUNTER — Ambulatory Visit (INDEPENDENT_AMBULATORY_CARE_PROVIDER_SITE_OTHER): Payer: BC Managed Care – PPO | Admitting: Family Medicine

## 2013-11-13 ENCOUNTER — Encounter: Payer: Self-pay | Admitting: Family Medicine

## 2013-11-13 VITALS — BP 132/88 | HR 94 | Temp 98.4°F | Wt 170.0 lb

## 2013-11-13 DIAGNOSIS — R300 Dysuria: Secondary | ICD-10-CM

## 2013-11-13 DIAGNOSIS — R319 Hematuria, unspecified: Secondary | ICD-10-CM

## 2013-11-13 LAB — POCT URINALYSIS DIPSTICK
GLUCOSE UA: NEGATIVE
Nitrite, UA: NEGATIVE
SPEC GRAV UA: 1.02
Urobilinogen, UA: 1
pH, UA: 6

## 2013-11-13 MED ORDER — CEPHALEXIN 500 MG PO CAPS: 500.0000 mg | ORAL_CAPSULE | Freq: Three times a day (TID) | ORAL | Status: DC

## 2013-11-13 NOTE — Progress Notes (Signed)
   Subjective:    Patient ID: Candice Guerrero, female    DOB: 1951/02/20, 63 y.o.   MRN: [id number]  HPI Patient seen with one-day history of dysuria She has some mild burning with urination and some nonspecific lower lumbar back pain. Possibly some mild hematuria. She relates several years ago microscopic hematuria and was referred by GYN to urologist back up in [place]. She had full urologic workup for hematuria which was negative.  She denies any fever or chills. No nausea or vomiting. She has taken Keflex in the past for UTI without difficulty. No recent UTI. No vaginal discharge.  Past Medical History  Diagnosis Date  . Depression   . GERD (gastroesophageal reflux disease)   . Hyperlipidemia   . Psoriasis     arms, legs, back  . Headache(784.0)     migraines, sinus  . Diabetes mellitus     NIDDM  . Hypertension     under control, has been on med. since 2005  . Benign hematuria   . Snores     denies sx. of sleep apnea  . Dental crowns present   . Cataract immature     left eye   Past Surgical History  Procedure Laterality Date  . Dilation and curettage of uterus    . Tonsillectomy    . Left wrist surgery      July 9th 2013  . Hardware removal  07/11/2012    Procedure: HARDWARE REMOVAL;  Surgeon: Cammie Sickle., MD;  Location: [facility];  Service: Orthopedics;  Laterality: Left;  pin removal wire out and debride pisiform  . Trigger finger release  07/11/2012    Procedure: RELEASE TRIGGER FINGER/A-1 PULLEY;  Surgeon: Cammie Sickle., MD;  Location: [facility];  Service: Orthopedics;  Laterality: Left;  LEFT LONG FINGER    reports that she has quit smoking. She has never used smokeless tobacco. She reports that she drinks alcohol. She reports that she does not use illicit drugs. family history includes Anesthesia problems in her sister; Hyperlipidemia in her mother; Hypertension in her father and mother; Kidney disease in her  father. Allergies  Allergen Reactions  . Hydrocodone-Acetaminophen Nausea And Vomiting    DIZZINESS  . Altace [Ramipril] Cough      Review of Systems  Constitutional: Negative for fever, chills and unexpected weight change.  Gastrointestinal: Negative for abdominal pain.  Genitourinary: Positive for dysuria. Negative for flank pain.       Objective:   Physical Exam  Constitutional: She appears well-developed and well-nourished.  Cardiovascular: Normal rate and regular rhythm.   Pulmonary/Chest: Effort normal and breath sounds normal. No respiratory distress. She has no wheezes. She has no rales.          Assessment & Plan:  Dysuria. Suspect uncomplicated cystitis. Urine culture sent. Keflex 500 mg 3 times a day for 5 days. She apparently has chronic microscopic hematuria and has had full urologic workup previously which was negative

## 2013-11-13 NOTE — Progress Notes (Signed)
Pre visit review using our clinic review tool, if applicable. No additional management support is needed unless otherwise documented below in the visit note. 

## 2013-11-13 NOTE — Patient Instructions (Signed)
Urinary Tract Infection  Urinary tract infections (UTIs) can develop anywhere along your urinary tract. Your urinary tract is your body's drainage system for removing wastes and extra water. Your urinary tract includes two kidneys, two ureters, a bladder, and a urethra. Your kidneys are a pair of bean-shaped organs. Each kidney is about the size of your fist. They are located below your ribs, one on each side of your spine.  CAUSES  Infections are caused by microbes, which are microscopic organisms, including fungi, viruses, and bacteria. These organisms are so small that they can only be seen through a microscope. Bacteria are the microbes that most commonly cause UTIs.  SYMPTOMS   Symptoms of UTIs may vary by age and gender of the patient and by the location of the infection. Symptoms in young women typically include a frequent and intense urge to urinate and a painful, burning feeling in the bladder or urethra during urination. Older women and men are more likely to be tired, shaky, and weak and have muscle aches and abdominal pain. A fever may mean the infection is in your kidneys. Other symptoms of a kidney infection include pain in your back or sides below the ribs, nausea, and vomiting.  DIAGNOSIS  To diagnose a UTI, your caregiver will ask you about your symptoms. Your caregiver also will ask to provide a urine sample. The urine sample will be tested for bacteria and white blood cells. White blood cells are made by your body to help fight infection.  TREATMENT   Typically, UTIs can be treated with medication. Because most UTIs are caused by a bacterial infection, they usually can be treated with the use of antibiotics. The choice of antibiotic and length of treatment depend on your symptoms and the type of bacteria causing your infection.  HOME CARE INSTRUCTIONS   If you were prescribed antibiotics, take them exactly as your caregiver instructs you. Finish the medication even if you feel better after you  have only taken some of the medication.   Drink enough water and fluids to keep your urine clear or pale yellow.   Avoid caffeine, tea, and carbonated beverages. They tend to irritate your bladder.   Empty your bladder often. Avoid holding urine for long periods of time.   Empty your bladder before and after sexual intercourse.   After a bowel movement, women should cleanse from front to back. Use each tissue only once.  SEEK MEDICAL CARE IF:    You have back pain.   You develop a fever.   Your symptoms do not begin to resolve within 3 days.  SEEK IMMEDIATE MEDICAL CARE IF:    You have severe back pain or lower abdominal pain.   You develop chills.   You have nausea or vomiting.   You have continued burning or discomfort with urination.  MAKE SURE YOU:    Understand these instructions.   Will watch your condition.   Will get help right away if you are not doing well or get worse.  Document Released: 07/12/2005 Document Revised: 04/02/2012 Document Reviewed: 11/10/2011  ExitCare Patient Information 2014 [employer].

## 2013-11-16 ENCOUNTER — Encounter: Payer: Self-pay | Admitting: Family Medicine

## 2013-11-16 LAB — URINE CULTURE

## 2013-12-24 ENCOUNTER — Other Ambulatory Visit (INDEPENDENT_AMBULATORY_CARE_PROVIDER_SITE_OTHER): Payer: BC Managed Care – PPO

## 2013-12-24 DIAGNOSIS — Z0000 Encounter for general adult medical examination without abnormal findings: Secondary | ICD-10-CM

## 2013-12-24 LAB — POCT URINALYSIS DIPSTICK
Bilirubin, UA: NEGATIVE
Glucose, UA: NEGATIVE
Ketones, UA: NEGATIVE
Nitrite, UA: NEGATIVE
SPEC GRAV UA: 1.01
Urobilinogen, UA: 0.2
pH, UA: 7

## 2013-12-24 LAB — CBC WITH DIFFERENTIAL/PLATELET
BASOS ABS: 0 10*3/uL (ref 0.0–0.1)
BASOS PCT: 0.3 % (ref 0.0–3.0)
Eosinophils Absolute: 0.1 10*3/uL (ref 0.0–0.7)
Eosinophils Relative: 0.8 % (ref 0.0–5.0)
HCT: 44.5 % (ref 36.0–46.0)
HEMOGLOBIN: 14.6 g/dL (ref 12.0–15.0)
LYMPHS PCT: 34 % (ref 12.0–46.0)
Lymphs Abs: 2.4 10*3/uL (ref 0.7–4.0)
MCHC: 33 g/dL (ref 30.0–36.0)
MCV: 91.6 fl (ref 78.0–100.0)
MONOS PCT: 15.9 % — AB (ref 3.0–12.0)
Monocytes Absolute: 1.1 10*3/uL — ABNORMAL HIGH (ref 0.1–1.0)
NEUTROS ABS: 3.5 10*3/uL (ref 1.4–7.7)
Neutrophils Relative %: 49 % (ref 43.0–77.0)
Platelets: 306 10*3/uL (ref 150.0–400.0)
RBC: 4.86 Mil/uL (ref 3.87–5.11)
RDW: 13.5 % (ref 11.5–14.6)
WBC: 7.1 10*3/uL (ref 4.5–10.5)

## 2013-12-24 LAB — BASIC METABOLIC PANEL
BUN: 13 mg/dL (ref 6–23)
CHLORIDE: 98 meq/L (ref 96–112)
CO2: 30 meq/L (ref 19–32)
CREATININE: 0.8 mg/dL (ref 0.4–1.2)
Calcium: 9.3 mg/dL (ref 8.4–10.5)
GFR: 76.55 mL/min (ref >60.00)
Glucose, Bld: 117 mg/dL — ABNORMAL HIGH (ref 70–99)
POTASSIUM: 3.8 meq/L (ref 3.5–5.1)
Sodium: 137 mEq/L (ref 135–145)

## 2013-12-24 LAB — MICROALBUMIN / CREATININE URINE RATIO
Creatinine,U: 80.5 mg/dL
Microalb Creat Ratio: 15.5 mg/g (ref 0.0–30.0)
Microalb, Ur: 12.5 mg/dL — ABNORMAL HIGH (ref 0.0–1.9)

## 2013-12-24 LAB — HEPATIC FUNCTION PANEL
ALT: 72 U/L — AB (ref 0–35)
AST: 48 U/L — AB (ref 0–37)
Albumin: 4.1 g/dL (ref 3.5–5.2)
Alkaline Phosphatase: 101 U/L (ref 39–117)
Bilirubin, Direct: 0.1 mg/dL (ref 0.0–0.3)
Total Bilirubin: 0.5 mg/dL (ref 0.3–1.2)
Total Protein: 7.8 g/dL (ref 6.0–8.3)

## 2013-12-24 LAB — LIPID PANEL
CHOL/HDL RATIO: 4
Cholesterol: 162 mg/dL (ref 0–200)
HDL: 37.3 mg/dL — ABNORMAL LOW (ref >39.00)
LDL CALC: 82 mg/dL (ref 0–99)
TRIGLYCERIDES: 212 mg/dL — AB (ref 0.0–149.0)
VLDL: 42.4 mg/dL — AB (ref 0.0–40.0)

## 2013-12-24 LAB — HEMOGLOBIN A1C: Hgb A1c MFr Bld: 7 % — ABNORMAL HIGH (ref 4.6–6.5)

## 2013-12-24 LAB — TSH: TSH: 1.77 u[IU]/mL (ref 0.35–5.50)

## 2013-12-31 ENCOUNTER — Encounter: Payer: Self-pay | Admitting: Internal Medicine

## 2013-12-31 ENCOUNTER — Ambulatory Visit (INDEPENDENT_AMBULATORY_CARE_PROVIDER_SITE_OTHER): Payer: BC Managed Care – PPO | Admitting: Internal Medicine

## 2013-12-31 VITALS — BP 160/80 | HR 65 | Temp 98.0°F | Wt 166.0 lb

## 2013-12-31 DIAGNOSIS — IMO0001 Reserved for inherently not codable concepts without codable children: Secondary | ICD-10-CM

## 2013-12-31 DIAGNOSIS — Z0000 Encounter for general adult medical examination without abnormal findings: Secondary | ICD-10-CM

## 2013-12-31 DIAGNOSIS — Z23 Encounter for immunization: Secondary | ICD-10-CM

## 2013-12-31 DIAGNOSIS — G250 Essential tremor: Secondary | ICD-10-CM

## 2013-12-31 DIAGNOSIS — E785 Hyperlipidemia, unspecified: Secondary | ICD-10-CM

## 2013-12-31 DIAGNOSIS — E1165 Type 2 diabetes mellitus with hyperglycemia: Secondary | ICD-10-CM

## 2013-12-31 DIAGNOSIS — G252 Other specified forms of tremor: Secondary | ICD-10-CM

## 2013-12-31 DIAGNOSIS — E669 Obesity, unspecified: Secondary | ICD-10-CM | POA: Insufficient documentation

## 2013-12-31 MED ORDER — CALCIPOTRIENE-BETAMETH DIPROP 0.005-0.064 % EX OINT: TOPICAL_OINTMENT | Freq: Every day | CUTANEOUS | Status: DC

## 2013-12-31 MED ORDER — OMEPRAZOLE 40 MG PO CPDR: DELAYED_RELEASE_CAPSULE | ORAL | Status: DC

## 2013-12-31 NOTE — Progress Notes (Signed)
Subjective:    Patient ID: Candice Guerrero, female    DOB: October 24, 1950, 63 y.o.   MRN: [id number]  Hypertension Pertinent negatives include no headaches, neck pain or shortness of breath.  Gastrophageal Reflux She reports no abdominal pain, no coughing or no wheezing. Pertinent negatives include no fatigue.  Diabetes Pertinent negatives for hypoglycemia include no dizziness or headaches. Pertinent negatives for diabetes include no fatigue and no weakness.   -year-old female who presents for her annual examination.  On review of her blood work prior to her visit she had 2+ blood and 1+ leukocytes in her urinalysis and had an increased is elevated liver functions that have been attributed to fatty liver and these are associated with elevated triglycerides.  Diabetes and hyperlipidemia.  her hemoglobin A1c was elevated at 7.0 with a goal for her of 6.5  Tremor of head and hands Notices when cooking with effort  Review of Systems  Constitutional: Negative for activity change, appetite change and fatigue.  HENT: Negative for congestion, ear pain, postnasal drip and sinus pressure.   Eyes: Negative for redness and visual disturbance.  Respiratory: Negative for cough, shortness of breath and wheezing.   Gastrointestinal: Negative for abdominal pain and abdominal distention.  Genitourinary: Negative for dysuria, frequency and menstrual problem.  Musculoskeletal: Negative for arthralgias, joint swelling, myalgias and neck pain.  Skin: Negative for rash and wound.  Neurological: Negative for dizziness, weakness and headaches.  Hematological: Negative for adenopathy. Does not bruise/bleed easily.  Psychiatric/Behavioral: Negative for sleep disturbance and decreased concentration.   Past Medical History  Diagnosis Date  . Depression   . GERD (gastroesophageal reflux disease)   . Hyperlipidemia   . Psoriasis     arms, legs, back  . Headache(784.0)     migraines, sinus  . Diabetes mellitus      NIDDM  . Hypertension     under control, has been on med. since 2005  . Benign hematuria   . Snores     denies sx. of sleep apnea  . Dental crowns present   . Cataract immature     left eye    History   Social History  . Marital Status: Married    Spouse Name: N/A    Number of Children: N/A  . Years of Education: N/A   Occupational History  . Not on file.   Social History Main Topics  . Smoking status: Former Research scientist (life sciences)  . Smokeless tobacco: Never Used     Comment: quit smoking in 2000  . Alcohol Use: 0.0 oz/week     Comment: socially, weekends  . Drug Use: No  . Sexual Activity: Yes   Other Topics Concern  . Not on file   Social History Narrative  . No narrative on file    Past Surgical History  Procedure Laterality Date  . Dilation and curettage of uterus    . Tonsillectomy    . Left wrist surgery      July 9th 2013  . Hardware removal  07/11/2012    Procedure: HARDWARE REMOVAL;  Surgeon: Cammie Sickle., MD;  Location: [facility];  Service: Orthopedics;  Laterality: Left;  pin removal wire out and debride pisiform  . Trigger finger release  07/11/2012    Procedure: RELEASE TRIGGER FINGER/A-1 PULLEY;  Surgeon: Cammie Sickle., MD;  Location: [facility];  Service: Orthopedics;  Laterality: Left;  LEFT LONG FINGER    Family History  Problem Relation Age of  Onset  . Hyperlipidemia Mother   . Hypertension Mother   . Hypertension Father   . Kidney disease Father   . Anesthesia problems Sister     post-op N/V, hard to wake up post-op    Allergies  Allergen Reactions  . Hydrocodone-Acetaminophen Nausea And Vomiting    DIZZINESS  . Altace [Ramipril] Cough    Current Outpatient Prescriptions on File Prior to Visit  Medication Sig Dispense Refill  . aspirin 81 MG tablet Take 81 mg by mouth daily.        Marland Kitchen aspirin-acetaminophen-caffeine (EXCEDRIN MIGRAINE) 250-250-65 MG per tablet Take 1 tablet by mouth every 6 (six)  hours as needed.        . calcipotriene-betamethasone (TACLONEX) ointment Apply topically daily.        . Calcium Carbonate (CALTRATE 600) 1500 MG TABS Take by mouth daily.        . cephALEXin (KEFLEX) 500 MG capsule Take 1 capsule (500 mg total) by mouth 3 (three) times daily.  15 capsule  0  . citalopram (CELEXA) 20 MG tablet TAKE 1 TABLET DAILY  90 tablet  3  . ezetimibe-simvastatin (VYTORIN) 10-40 MG per tablet Take 1 tablet by mouth at bedtime.  90 tablet  3  . metFORMIN (GLUCOPHAGE) 500 MG tablet TAKE 1 TABLET DAILY WITH BREAKFAST  90 tablet  3  . mometasone (NASONEX) 50 MCG/ACT nasal spray Place 2 sprays into the nose daily.      . naproxen sodium (ANAPROX) 220 MG tablet Take 220 mg by mouth 2 (two) times daily with a meal.      . omeprazole (PRILOSEC) 40 MG capsule TAKE 1 CAPSULE DAILY  90 capsule  3  . pseudoephedrine-codeine-guaifenesin (MYTUSSIN DAC) 30-10-100 MG/5ML solution Take 10 mLs by mouth 4 (four) times daily as needed for cough.  120 mL  0  . valsartan-hydrochlorothiazide (DIOVAN-HCT) 80-12.5 MG per tablet TAKE 1 TABLET DAILY  90 tablet  3  . hyoscyamine (LEVSIN/SL) 0.125 MG SL tablet Place 1 tablet (0.125 mg total) under the tongue every 4 (four) hours as needed for cramping.  90 tablet  3   No current facility-administered medications on file prior to visit.    BP 160/80  Pulse 65  Temp(Src) 98 F (36.7 C)  Wt 166 lb (75.297 kg)  SpO2 98%       Objective:   Physical Exam  Constitutional: She is oriented to person, place, and time. She appears well-developed and well-nourished. No distress.  HENT:  Head: Normocephalic and atraumatic.  Right Ear: External ear normal.  Left Ear: External ear normal.  Nose: Nose normal.  Mouth/Throat: Oropharynx is clear and moist.  Eyes: Conjunctivae and EOM are normal. Pupils are equal, round, and reactive to light.  Neck: Normal range of motion. Neck supple. No JVD present. No tracheal deviation present. No thyromegaly  present.  Cardiovascular: Normal rate, regular rhythm, normal heart sounds and intact distal pulses.   No murmur heard. Pulmonary/Chest: Effort normal and breath sounds normal. She has no wheezes. She exhibits no tenderness.  Abdominal: Soft. Bowel sounds are normal.  Musculoskeletal: Normal range of motion. She exhibits no edema and no tenderness.  Lymphadenopathy:    She has no cervical adenopathy.  Neurological: She is alert and oriented to person, place, and time. She has normal reflexes. No cranial nerve deficit.  Skin: Skin is warm and dry. She is not diaphoretic.  Psychiatric: She has a normal mood and affect. Her behavior is normal.  Assessment & Plan:  Weight loss is the key intervention Patient presents for yearly preventative medicine examination. Medicare questionnaire was completed  All immunizations and health maintenance protocols were reviewed with the patient and needed orders were placed.  Appropriate screening laboratory values were ordered for the patient including screening of hyperlipidemia, renal function and hepatic function. If indicated by BPH, a PSA was ordered.  Medication reconciliation,  past medical history, social history, problem list and allergies were reviewed in detail with the patient  Goals were established with regard to weight loss, exercise, and  diet in compliance with medications  End of life planning was discussed.  Diet Carb limiting essential tremor discussion of treatment options Mother had essential tremor  Set goals for weight loss due to fatty liver pnemovax

## 2013-12-31 NOTE — Patient Instructions (Signed)
The patient is instructed to continue all medications as prescribed. Schedule followup with check out clerk upon leaving the clinic  

## 2013-12-31 NOTE — Progress Notes (Signed)
Pre visit review using our clinic review tool, if applicable. No additional management support is needed unless otherwise documented below in the visit note. 

## 2013-12-31 NOTE — Addendum Note (Signed)
Addended by: Peggyann Shoals on: 12/31/2013 11:38 AM   Modules accepted: Orders

## 2014-01-08 ENCOUNTER — Telehealth: Payer: Self-pay

## 2014-01-08 NOTE — Telephone Encounter (Signed)
Relevant patient education assigned to patient using Emmi. ° °

## 2014-01-09 ENCOUNTER — Encounter: Payer: Self-pay | Admitting: Gastroenterology

## 2014-01-12 ENCOUNTER — Telehealth: Payer: Self-pay | Admitting: Internal Medicine

## 2014-01-12 DIAGNOSIS — R198 Other specified symptoms and signs involving the digestive system and abdomen: Secondary | ICD-10-CM

## 2014-01-12 NOTE — Telephone Encounter (Signed)
Pt would like to know if dr Arnoldo Morale could send referral to Dr Georga Bora. Pt is having poss IBS?, colitis? GI issues?. Pt made appt may 26, but would like to get in sooner if possible .pls advise

## 2014-03-10 ENCOUNTER — Encounter: Payer: Self-pay | Admitting: Gastroenterology

## 2014-03-10 ENCOUNTER — Ambulatory Visit (INDEPENDENT_AMBULATORY_CARE_PROVIDER_SITE_OTHER): Payer: BC Managed Care – PPO | Admitting: Gastroenterology

## 2014-03-10 VITALS — BP 122/80 | HR 88 | Ht 60.0 in | Wt 171.0 lb

## 2014-03-10 DIAGNOSIS — K219 Gastro-esophageal reflux disease without esophagitis: Secondary | ICD-10-CM | POA: Diagnosis not present

## 2014-03-10 DIAGNOSIS — K921 Melena: Secondary | ICD-10-CM | POA: Diagnosis not present

## 2014-03-10 DIAGNOSIS — R109 Unspecified abdominal pain: Secondary | ICD-10-CM

## 2014-03-10 DIAGNOSIS — R197 Diarrhea, unspecified: Secondary | ICD-10-CM

## 2014-03-10 MED ORDER — PEG-KCL-NACL-NASULF-NA ASC-C 100 G PO SOLR: 1.0000 | Freq: Once | ORAL | Status: DC

## 2014-03-10 NOTE — Progress Notes (Signed)
    History of Present Illness: This is a 63 year old female who relates a many year history of recurrent episodes of generalized abdominal pain associated with gas and diarrhea. Occasionally she notes small amounts of bright red blood with her diarrhea. The symptoms occur about once every 2-3 months and generally last or 1 or 2 days. In between she has no gastrointestinal complaints except for chronic GERD which is well controlled on daily omeprazole. She previously underwent colonoscopy in June 2007 in [place] showing sigmoid colon diverticulosis and internal hemorrhoids. EGD in September 2007 showed a small hiatal hernia. She takes hyoscyamine as needed which is effective in controlling the pain. Denies weight loss,  constipation, change in stool caliber, melena, nausea, vomiting, dysphagia, chest pain.  Review of Systems: Pertinent positive and negative review of systems were noted in the above HPI section. All other review of systems were otherwise negative.  Current Medications, Allergies, Past Medical History, Past Surgical History, Family History and Social History were reviewed in [employer] record.  Physical Exam: General: Well developed , well nourished, no acute distress Head: Normocephalic and atraumatic Eyes:  sclerae anicteric, EOMI Ears: Normal auditory acuity Mouth: No deformity or lesions Neck: Supple, no masses or thyromegaly Lungs: Clear throughout to auscultation Heart: Regular rate and rhythm; no murmurs, rubs or bruits Abdomen: Soft, non tender and non distended. No masses, hepatosplenomegaly or hernias noted. Normal Bowel sounds Rectal: Deferred to colonoscopy Musculoskeletal: Symmetrical with no gross deformities  Skin: No lesions on visible extremities Pulses:  Normal pulses noted Extremities: No clubbing, cyanosis, edema or deformities noted Neurological: Alert oriented x 4, grossly nonfocal Cervical Nodes:  No significant cervical  adenopathy Inguinal Nodes: No significant inguinal adenopathy Psychological:  Alert and cooperative. Normal mood and affect  Assessment and Recommendations:  1. Episodic generalized abdominal pain associated with diarrhea and occasional small volume hematochezia. Symptoms present and stable for many years. I suspect this is irritable bowel syndrome with hemorrhoidal bleeding. Rule out colorectal neoplasms, IBD and other disorders. Increase hyoscyamine to 1-2 every 4 hours as needed. Gas-X 4 times a day when necessary for occasional intestinal gas. Schedule colonoscopy. The risks, benefits, and alternatives to colonoscopy with possible biopsy and possible polypectomy were discussed with the patient and they consent to proceed.   2. GERD. Standard antireflux measures and continue omeprazole 40 mg daily.

## 2014-03-10 NOTE — Patient Instructions (Signed)
You have been scheduled for a colonoscopy with propofol. Please follow written instructions given to you at your visit today.  Please pick up your prep kit at the pharmacy within the next 1-3 days. If you use inhalers (even only as needed), please bring them with you on the day of your procedure. Your physician has requested that you go to www.startemmi.com and enter the access code given to you at your visit today. This web site gives a general overview about your procedure. However, you should still follow specific instructions given to you by our office regarding your preparation for the procedure.  Take your Levsin 1-2 tablets by mouth every 4 hours as needed.  Start over the counter Gas-X four times a day as needed for gas and bloating.  Patient advised to avoid spicy, acidic, citrus, chocolate, mints, fruit and fruit juices.  Limit the intake of caffeine, alcohol and Soda.  Don't exercise too soon after eating.  Don't lie down within 3-4 hours of eating.  Elevate the head of your bed.  Thank you for choosing me and [facility].  Pricilla Riffle. Dagoberto Ligas., MD., Marval Regal  cc: [name], MD

## 2014-04-11 ENCOUNTER — Other Ambulatory Visit: Payer: Self-pay | Admitting: Internal Medicine

## 2014-04-13 ENCOUNTER — Other Ambulatory Visit: Payer: Self-pay

## 2014-04-13 DIAGNOSIS — Z1231 Encounter for screening mammogram for malignant neoplasm of breast: Secondary | ICD-10-CM

## 2014-04-15 ENCOUNTER — Other Ambulatory Visit: Payer: Self-pay | Admitting: Orthopedic Surgery

## 2014-04-15 DIAGNOSIS — M25511 Pain in right shoulder: Secondary | ICD-10-CM

## 2014-04-15 DIAGNOSIS — M75120 Complete rotator cuff tear or rupture of unspecified shoulder, not specified as traumatic: Secondary | ICD-10-CM | POA: Diagnosis not present

## 2014-04-23 ENCOUNTER — Ambulatory Visit
Admission: RE | Admit: 2014-04-23 | Discharge: 2014-04-23 | Disposition: A | Discharge status: Home / Self Care | Payer: BC Managed Care – PPO | Source: Ambulatory Visit | Attending: Orthopedic Surgery | Admitting: Orthopedic Surgery

## 2014-04-23 DIAGNOSIS — M6688 Spontaneous rupture of other tendons, other: Secondary | ICD-10-CM | POA: Diagnosis not present

## 2014-04-23 DIAGNOSIS — M25511 Pain in right shoulder: Secondary | ICD-10-CM

## 2014-04-28 ENCOUNTER — Other Ambulatory Visit: Payer: Self-pay | Admitting: Internal Medicine

## 2014-04-30 ENCOUNTER — Encounter: Payer: Self-pay | Admitting: Gastroenterology

## 2014-04-30 ENCOUNTER — Ambulatory Visit (AMBULATORY_SURGERY_CENTER): Payer: BC Managed Care – PPO | Admitting: Gastroenterology

## 2014-04-30 VITALS — BP 134/77 | HR 61 | Temp 98.3°F | Resp 26 | Ht 60.0 in | Wt 171.0 lb

## 2014-04-30 DIAGNOSIS — E119 Type 2 diabetes mellitus without complications: Secondary | ICD-10-CM | POA: Diagnosis not present

## 2014-04-30 DIAGNOSIS — R197 Diarrhea, unspecified: Secondary | ICD-10-CM

## 2014-04-30 DIAGNOSIS — K921 Melena: Secondary | ICD-10-CM | POA: Diagnosis not present

## 2014-04-30 DIAGNOSIS — I10 Essential (primary) hypertension: Secondary | ICD-10-CM | POA: Diagnosis not present

## 2014-04-30 LAB — GLUCOSE, CAPILLARY
Glucose-Capillary: 88 mg/dL (ref 70–99)
Glucose-Capillary: 179 mg/dL — ABNORMAL HIGH (ref 70–99)

## 2014-04-30 LAB — HM COLONOSCOPY

## 2014-04-30 MED ORDER — DEXTROSE 5 % IV SOLN: INTRAVENOUS | Status: DC

## 2014-04-30 NOTE — Op Note (Signed)
[place] [facility]  [address] [place], [id number]   COLONOSCOPY PROCEDURE REPORT  PATIENT: Candice Guerrero, Candice Guerrero  MR#: [id number] BIRTHDATE: 17-Sep-1951 , 63  yrs. old GENDER: Female ENDOSCOPIST: Ladene Artist, MD, [facility] REFERRED BY: [name], MD PROCEDURE DATE:  04/30/2014 PROCEDURE:   Colonoscopy, diagnostic First Screening Colonoscopy - Avg.  risk and is 50 yrs.  old or older - No.  Prior Negative Screening - Now for repeat screening. N/A  History of Adenoma - Now for follow-up colonoscopy & has been > or = to 3 yrs.  N/A  Polyps Removed Today? No.  Recommend repeat exam, <10 yrs? No. ASA CLASS:   Class II INDICATIONS:Unexplained diarrhea and hematochezia. MEDICATIONS: MAC sedation, administered by CRNA and propofol (Diprivan) 200mg  IV DESCRIPTION OF PROCEDURE:   After the risks benefits and alternatives of the procedure were thoroughly explained, informed consent was obtained.  A digital rectal exam revealed no abnormalities of the rectum.   The LB [id number]  endoscope was introduced through the anus and advanced to the cecum, which was identified by both the appendix and ileocecal valve. No adverse events experienced.   The quality of the prep was good, using MoviPrep  The instrument was then slowly withdrawn as the colon was fully examined.  COLON FINDINGS: Moderate diverticulosis was noted in the sigmoid colon.   The colon was otherwise normal.  There was no diverticulosis, inflammation, polyps or cancers unless previously stated.  Retroflexed views revealed small internal hemorrhoids. The time to cecum=6 minutes 19 seconds.  Withdrawal time=9 minutes 23 seconds.  The scope was withdrawn and the procedure completed.  COMPLICATIONS: There were no complications.  ENDOSCOPIC IMPRESSION: 1.   Moderate diverticulosis in the sigmoid colon 2.   Small internal hemorrhoids  RECOMMENDATIONS: 1.  High fiber diet with liberal fluid intake. 2.  You should  continue to follow colorectal cancer screening guidelines for "routine risk" patients with a repeat colonoscopy in 10 years.  There is no need for routine, screening FOBT (stool) testing for at least 5 years.  eSigned:  Ladene Artist, MD, [facility] 04/30/2014 8:31 AM

## 2014-04-30 NOTE — Progress Notes (Signed)
A/ox3 pleased with MAC, report to Penny RN 

## 2014-04-30 NOTE — Patient Instructions (Signed)
YOU HAD AN ENDOSCOPIC PROCEDURE TODAY AT THE [facility]: Refer to the procedure report that was given to you for any specific questions about what was found during the examination.  If the procedure report does not answer your questions, please call your gastroenterologist to clarify.  If you requested that your care partner not be given the details of your procedure findings, then the procedure report has been included in a sealed envelope for you to review at your convenience later.  YOU SHOULD EXPECT: Some feelings of bloating in the abdomen. Passage of more gas than usual.  Walking can help get rid of the air that was put into your GI tract during the procedure and reduce the bloating. If you had a lower endoscopy (such as a colonoscopy or flexible sigmoidoscopy) you may notice spotting of blood in your stool or on the toilet paper. If you underwent a bowel prep for your procedure, then you may not have a normal bowel movement for a few days.  DIET: Your first meal following the procedure should be a light meal and then it is ok to progress to your normal diet.  A half-sandwich or bowl of soup is an example of a good first meal.  Heavy or fried foods are harder to digest and may make you feel nauseous or bloated.  Likewise meals heavy in dairy and vegetables can cause extra gas to form and this can also increase the bloating.  Drink plenty of fluids but you should avoid alcoholic beverages for 24 hours.  ACTIVITY: Your care partner should take you home directly after the procedure.  You should plan to take it easy, moving slowly for the rest of the day.  You can resume normal activity the day after the procedure however you should NOT DRIVE or use heavy machinery for 24 hours (because of the sedation medicines used during the test).    SYMPTOMS TO REPORT IMMEDIATELY: A gastroenterologist can be reached at any hour.  During normal business hours, 8:30 AM to 5:00 PM Monday through Friday,  call [phone].  After hours and on weekends, please call the GI answering service at [phone] who will take a message and have the physician on call contact you.   Following lower endoscopy (colonoscopy or flexible sigmoidoscopy):  Excessive amounts of blood in the stool  Significant tenderness or worsening of abdominal pains  Swelling of the abdomen that is new, acute  Fever of 100F or higher    FOLLOW UP: If any biopsies were taken you will be contacted by phone or by letter within the next 1-3 weeks.  Call your gastroenterologist if you have not heard about the biopsies in 3 weeks.  Our staff will call the home number listed on your records the next business day following your procedure to check on you and address any questions or concerns that you may have at that time regarding the information given to you following your procedure. This is a courtesy call and so if there is no answer at the home number and we have not heard from you through the emergency physician on call, we will assume that you have returned to your regular daily activities without incident.  SIGNATURES/CONFIDENTIALITY: You and/or your care partner have signed paperwork which will be entered into your electronic medical record.  These signatures attest to the fact that that the information above on your After Visit Summary has been reviewed and is understood.  Full responsibility of the confidentiality   of this discharge information lies with you and/or your care-partner.    INFORMATION ON DIVERTICULOSIS ,HIGH FIBER DIET , AND HEMORRHOIDS GIVEN TO YOU TODAY

## 2014-05-01 ENCOUNTER — Telehealth: Payer: Self-pay | Admitting: *Deleted

## 2014-05-01 NOTE — Telephone Encounter (Signed)
  Follow up Call-  Call back number 04/30/2014  Post procedure Call Back phone  # [phone]  Permission to leave phone message Yes     Patient questions:  Do you have a fever, pain , or abdominal swelling? No. Pain Score  0 *  Have you tolerated food without any problems? Yes.    Have you been able to return to your normal activities? Yes.    Do you have any questions about your discharge instructions: Diet   No. Medications  No. Follow up visit  No.  Do you have questions or concerns about your Care? No.  Actions: * If pain score is 4 or above: No action needed, pain <4.

## 2014-05-04 DIAGNOSIS — M75120 Complete rotator cuff tear or rupture of unspecified shoulder, not specified as traumatic: Secondary | ICD-10-CM | POA: Diagnosis not present

## 2014-05-15 ENCOUNTER — Other Ambulatory Visit: Payer: Self-pay | Admitting: Internal Medicine

## 2014-05-18 ENCOUNTER — Encounter: Payer: Self-pay | Admitting: Family Medicine

## 2014-05-18 ENCOUNTER — Ambulatory Visit (INDEPENDENT_AMBULATORY_CARE_PROVIDER_SITE_OTHER): Payer: BC Managed Care – PPO | Admitting: Family Medicine

## 2014-05-18 VITALS — BP 126/82 | HR 80 | Temp 98.8°F | Ht 60.0 in | Wt 169.0 lb

## 2014-05-18 DIAGNOSIS — R05 Cough: Secondary | ICD-10-CM

## 2014-05-18 DIAGNOSIS — J302 Other seasonal allergic rhinitis: Secondary | ICD-10-CM

## 2014-05-18 DIAGNOSIS — H65191 Other acute nonsuppurative otitis media, right ear: Secondary | ICD-10-CM

## 2014-05-18 DIAGNOSIS — R0982 Postnasal drip: Secondary | ICD-10-CM

## 2014-05-18 DIAGNOSIS — R059 Cough, unspecified: Secondary | ICD-10-CM | POA: Diagnosis not present

## 2014-05-18 DIAGNOSIS — H65199 Other acute nonsuppurative otitis media, unspecified ear: Secondary | ICD-10-CM

## 2014-05-18 DIAGNOSIS — J3089 Other allergic rhinitis: Secondary | ICD-10-CM | POA: Diagnosis not present

## 2014-05-18 MED ORDER — FLUTICASONE PROPIONATE 50 MCG/ACT NA SUSP: 2.0000 | Freq: Every day | NASAL | Status: DC

## 2014-05-18 NOTE — Patient Instructions (Signed)
-  AFRIN for 4 days - then STOP  -flonase daily for 1 month - 2 sprays each nostril  -claritin OR zyrtec daily  -follow up in 2-3 weeks

## 2014-05-18 NOTE — Progress Notes (Signed)
Pre visit review using our clinic review tool, if applicable. No additional management support is needed unless otherwise documented below in the visit note. 

## 2014-05-18 NOTE — Progress Notes (Signed)
No chief complaint on file.   HPI:  Cough: -started: 2 weeks ago, intermittent in the past -symptoms:cough, sniffling, PND -denies:fever, SOB, NVD, tooth pain -has tried: delsum -sick contacts/travel/risks: denies flu exposure, tick exposure or or Ebola risks -Hx of: allergies ROS: See pertinent positives and negatives per HPI.  Past Medical History  Diagnosis Date  . Depression   . GERD (gastroesophageal reflux disease)   . Hyperlipidemia   . Psoriasis     arms, legs, back  . Headache(784.0)     migraines, sinus  . Diabetes mellitus     NIDDM  . Hypertension     under control, has been on med. since 2005  . Benign hematuria   . Snores     denies sx. of sleep apnea  . Dental crowns present   . Hiatal hernia   . Allergy     SEASONAL  . Cataract     LEFT EYE  . Osteopenia     Past Surgical History  Procedure Laterality Date  . Dilation and curettage of uterus    . Tonsillectomy    . Left wrist surgery      July 9th 2013  . Hardware removal  07/11/2012    Procedure: HARDWARE REMOVAL;  Surgeon: Cammie Sickle., MD;  Location: [facility];  Service: Orthopedics;  Laterality: Left;  pin removal wire out and debride pisiform  . Trigger finger release  07/11/2012    Procedure: RELEASE TRIGGER FINGER/A-1 PULLEY;  Surgeon: Cammie Sickle., MD;  Location: [facility];  Service: Orthopedics;  Laterality: Left;  LEFT LONG FINGER  . Colonoscopy      Family History  Problem Relation Age of Onset  . Hyperlipidemia Mother   . Hypertension Mother   . Hypertension Father   . Kidney disease Father   . Anesthesia problems Sister     post-op N/V, hard to wake up post-op  . Diabetes    . Diabetes Maternal Uncle     History   Social History  . Marital Status: Married    Spouse Name: N/A    Number of Children: N/A  . Years of Education: N/A   Social History Main Topics  . Smoking status: Former Research scientist (life sciences)  . Smokeless tobacco: Never Used      Comment: quit smoking in 2000  . Alcohol Use: 0.0 oz/week     Comment: socially, weekends  . Drug Use: No  . Sexual Activity: Yes   Other Topics Concern  . None   Social History Narrative  . None    Current outpatient prescriptions:aspirin 81 MG tablet, Take 81 mg by mouth daily.  , Disp: , Rfl: ;  aspirin-acetaminophen-caffeine (EXCEDRIN MIGRAINE) 250-250-65 MG per tablet, Take 1 tablet by mouth every 6 (six) hours as needed.  , Disp: , Rfl: ;  calcipotriene-betamethasone (TACLONEX) ointment, Apply topically daily., Disp: 60 g, Rfl: 0;  Calcium Carbonate (CALTRATE 600) 1500 MG TABS, Take by mouth daily.  , Disp: , Rfl:  CHERATUSSIN AC 100-10 MG/5ML syrup, , Disp: , Rfl: ;  citalopram (CELEXA) 20 MG tablet, TAKE 1 TABLET DAILY, Disp: 90 tablet, Rfl: 2;  ezetimibe-simvastatin (VYTORIN) 10-40 MG per tablet, Take 1 tablet by mouth at bedtime., Disp: 90 tablet, Rfl: 3;  hyoscyamine (LEVSIN, ANASPAZ) 0.125 MG tablet, Take 0.125 mg by mouth every 4 (four) hours as needed., Disp: , Rfl:  metFORMIN (GLUCOPHAGE) 500 MG tablet, TAKE 1 TABLET DAILY WITH BREAKFAST, Disp: 90 tablet, Rfl: 3;  mometasone (NASONEX) 50 MCG/ACT nasal spray, Place 2 sprays into the nose daily., Disp: , Rfl: ;  omeprazole (PRILOSEC) 40 MG capsule, TAKE 1 CAPSULE DAILY, Disp: 90 capsule, Rfl: 2;  valsartan-hydrochlorothiazide (DIOVAN-HCT) 80-12.5 MG per tablet, TAKE 1 TABLET DAILY, Disp: 90 tablet, Rfl: 2 VITAMIN D, ERGOCALCIFEROL, PO, Take by mouth daily., Disp: , Rfl: ;  fluticasone (FLONASE) 50 MCG/ACT nasal spray, Place 2 sprays into both nostrils daily., Disp: 16 g, Rfl: 1;  hyoscyamine (LEVSIN/SL) 0.125 MG SL tablet, Place 1 tablet (0.125 mg total) under the tongue every 4 (four) hours as needed for cramping., Disp: 90 tablet, Rfl: 3  EXAM:  Filed Vitals:   05/18/14 1322  BP: 126/82  Pulse: 80  Temp: 98.8 F (37.1 C)    Body mass index is 33.01 kg/(m^2).  GENERAL: vitals reviewed and listed above, alert,  oriented, appears well hydrated and in no acute distress  HEENT: atraumatic, conjunttiva clear, no obvious abnormalities on inspection of external nose and ears, normal appearance of ear canals and TMs except clear effusion on R, clear nasal congestion, mild post oropharyngeal erythema with PND, no tonsillar edema or exudate, no sinus TTP  NECK: no obvious masses on inspection  LUNGS: clear to auscultation bilaterally, no wheezes, rales or rhonchi, good air movement  CV: HRRR, no peripheral edema  MS: moves all extremities without noticeable abnormality  PSYCH: pleasant and cooperative, no obvious depression or anxiety  ASSESSMENT AND PLAN:  Discussed the following assessment and plan:  PND (post-nasal drip) - Plan: fluticasone (FLONASE) 50 MCG/ACT nasal spray  Cough - Plan: fluticasone (FLONASE) 50 MCG/ACT nasal spray  Other seasonal allergic rhinitis  Acute effusion of right ear - Plan: fluticasone (FLONASE) 50 MCG/ACT nasal spray  - We discussed potential etiologies, with PND (likely allergic) being most likely - We discussed treatment side effects, likely course, antibiotic misuse, transmission, and signs of developing a serious illness. -opted per instructions and orders and she is to follow up with me or Dr. Yong Channel (she is establishing with him in October) if persists in 2-3 weeks -of course, we advised to return or notify a doctor immediately if symptoms worsen or persist or new concerns arise.    There are no Patient Instructions on file for this visit.   Colin Benton R.

## 2014-05-19 ENCOUNTER — Ambulatory Visit
Admission: RE | Admit: 2014-05-19 | Discharge: 2014-05-19 | Disposition: A | Discharge status: Home / Self Care | Payer: BC Managed Care – PPO | Source: Ambulatory Visit

## 2014-05-19 DIAGNOSIS — Z1231 Encounter for screening mammogram for malignant neoplasm of breast: Secondary | ICD-10-CM

## 2014-05-19 LAB — HM MAMMOGRAPHY

## 2014-06-05 DIAGNOSIS — M67919 Unspecified disorder of synovium and tendon, unspecified shoulder: Secondary | ICD-10-CM | POA: Diagnosis not present

## 2014-06-07 ENCOUNTER — Other Ambulatory Visit: Payer: Self-pay | Admitting: Internal Medicine

## 2014-06-08 ENCOUNTER — Encounter: Payer: Self-pay | Admitting: Family Medicine

## 2014-06-08 ENCOUNTER — Ambulatory Visit (INDEPENDENT_AMBULATORY_CARE_PROVIDER_SITE_OTHER): Payer: BC Managed Care – PPO | Admitting: Family Medicine

## 2014-06-08 VITALS — BP 120/82 | HR 84 | Temp 98.6°F | Ht 60.0 in | Wt 167.5 lb

## 2014-06-08 DIAGNOSIS — L409 Psoriasis, unspecified: Secondary | ICD-10-CM

## 2014-06-08 DIAGNOSIS — L408 Other psoriasis: Secondary | ICD-10-CM | POA: Diagnosis not present

## 2014-06-08 DIAGNOSIS — J3089 Other allergic rhinitis: Secondary | ICD-10-CM

## 2014-06-08 MED ORDER — CALCIPOTRIENE-BETAMETH DIPROP 0.005-0.064 % EX OINT: TOPICAL_OINTMENT | Freq: Every day | CUTANEOUS | Status: DC

## 2014-06-08 NOTE — Progress Notes (Signed)
Pre visit review using our clinic review tool, if applicable. No additional management support is needed unless otherwise documented below in the visit note. 

## 2014-06-08 NOTE — Progress Notes (Signed)
No chief complaint on file.   HPI:  Follow up:  1)Cough: -started about 1 months ago -symptoms: productive cough, PND -seen 8/3 and tx with afrin for a few days, INS for PND and ear effusion -reports: cough resolved, feels a lot better, still taking zyrtec and INS -denies: cough, SOB, DOE, hearing loss, sinus pain -seeing Dr. Yong Channel in October  2)Psoriasis: -using talclnex, wants refill -going to see dermatologist but takes time to get in and almost out of ointment -applies ointment to new lesions and this helps  ROS: See pertinent positives and negatives per HPI.  Past Medical History  Diagnosis Date  . Depression   . GERD (gastroesophageal reflux disease)   . Hyperlipidemia   . Psoriasis     arms, legs, back  . Headache(784.0)     migraines, sinus  . Diabetes mellitus     NIDDM  . Hypertension     under control, has been on med. since 2005  . Benign hematuria   . Snores     denies sx. of sleep apnea  . Dental crowns present   . Hiatal hernia   . Allergy     SEASONAL  . Cataract     LEFT EYE  . Osteopenia     Past Surgical History  Procedure Laterality Date  . Dilation and curettage of uterus    . Tonsillectomy    . Left wrist surgery      July 9th 2013  . Hardware removal  07/11/2012    Procedure: HARDWARE REMOVAL;  Surgeon: Cammie Sickle., MD;  Location: [facility];  Service: Orthopedics;  Laterality: Left;  pin removal wire out and debride pisiform  . Trigger finger release  07/11/2012    Procedure: RELEASE TRIGGER FINGER/A-1 PULLEY;  Surgeon: Cammie Sickle., MD;  Location: [facility];  Service: Orthopedics;  Laterality: Left;  LEFT LONG FINGER  . Colonoscopy      Family History  Problem Relation Age of Onset  . Hyperlipidemia Mother   . Hypertension Mother   . Hypertension Father   . Kidney disease Father   . Anesthesia problems Sister     post-op N/V, hard to wake up post-op  . Diabetes    . Diabetes  Maternal Uncle     History   Social History  . Marital Status: Married    Spouse Name: N/A    Number of Children: N/A  . Years of Education: N/A   Social History Main Topics  . Smoking status: Former Research scientist (life sciences)  . Smokeless tobacco: Never Used     Comment: quit smoking in 2000  . Alcohol Use: 0.0 oz/week     Comment: socially, weekends  . Drug Use: No  . Sexual Activity: Yes   Other Topics Concern  . None   Social History Narrative  . None    Current outpatient prescriptions:aspirin 81 MG tablet, Take 81 mg by mouth daily.  , Disp: , Rfl: ;  aspirin-acetaminophen-caffeine (EXCEDRIN MIGRAINE) 250-250-65 MG per tablet, Take 1 tablet by mouth every 6 (six) hours as needed.  , Disp: , Rfl: ;  calcipotriene-betamethasone (TACLONEX) ointment, Apply topically daily., Disp: 60 g, Rfl: 0;  Calcium Carbonate (CALTRATE 600) 1500 MG TABS, Take by mouth daily.  , Disp: , Rfl:  CHERATUSSIN AC 100-10 MG/5ML syrup, , Disp: , Rfl: ;  citalopram (CELEXA) 20 MG tablet, TAKE 1 TABLET DAILY, Disp: 90 tablet, Rfl: 2;  ezetimibe-simvastatin (VYTORIN) 10-40 MG per tablet,  Take 1 tablet by mouth at bedtime., Disp: 90 tablet, Rfl: 3;  fluticasone (FLONASE) 50 MCG/ACT nasal spray, Place 2 sprays into both nostrils daily., Disp: 16 g, Rfl: 1 hyoscyamine (LEVSIN, ANASPAZ) 0.125 MG tablet, Take 0.125 mg by mouth every 4 (four) hours as needed., Disp: , Rfl: ;  metFORMIN (GLUCOPHAGE) 500 MG tablet, TAKE 1 TABLET DAILY WITH BREAKFAST, Disp: 90 tablet, Rfl: 3;  mometasone (NASONEX) 50 MCG/ACT nasal spray, Place 2 sprays into the nose daily., Disp: , Rfl: ;  omeprazole (PRILOSEC) 40 MG capsule, TAKE 1 CAPSULE DAILY, Disp: 90 capsule, Rfl: 2 valsartan-hydrochlorothiazide (DIOVAN-HCT) 80-12.5 MG per tablet, TAKE 1 TABLET DAILY, Disp: 90 tablet, Rfl: 2;  VITAMIN D, ERGOCALCIFEROL, PO, Take by mouth daily., Disp: , Rfl: ;  hyoscyamine (LEVSIN/SL) 0.125 MG SL tablet, Place 1 tablet (0.125 mg total) under the tongue every 4  (four) hours as needed for cramping., Disp: 90 tablet, Rfl: 3  EXAM:  Filed Vitals:   06/08/14 1302  BP: 120/82  Pulse: 84  Temp: 98.6 F (37 C)    Body mass index is 32.71 kg/(m^2).  GENERAL: vitals reviewed and listed above, alert, oriented, appears well hydrated and in no acute distress  HEENT: atraumatic, conjunttiva clear, no obvious abnormalities on inspection of external nose and ears, normal appearance of ear canals, nasal mucosa normal, no tonsillar edema or exudate, no sinus TTP  NECK: no obvious masses on inspection  LUNGS: clear to auscultation bilaterally, no wheezes, rales or rhonchi, good air movement  CV: HRRR, no peripheral edema  MS: moves all extremities without noticeable abnormality  SKIN: moderately thick silvery plaques on ext surfaces LEs  PSYCH: pleasant and cooperative, no obvious depression or anxiety  ASSESSMENT AND PLAN:  Discussed the following assessment and plan:  Other allergic rhinitis  Psoriasis - Plan: calcipotriene-betamethasone (TACLONEX) ointment  -continue INS and antihistamine for 1 month, then try trial off -consider seeing allergist if persistent or frequent symptoms - numbers provided to call -refilled ointment after discussion risks - follow up with derm -Patient advised to return or notify a doctor immediately if symptoms worsen or persist or new concerns arise.  There are no Patient Instructions on file for this visit.   Colin Benton R.

## 2014-06-10 ENCOUNTER — Other Ambulatory Visit: Payer: Self-pay | Admitting: *Deleted

## 2014-06-10 MED ORDER — METFORMIN HCL 500 MG PO TABS: ORAL_TABLET | ORAL | Status: DC

## 2014-06-26 ENCOUNTER — Other Ambulatory Visit: Payer: BC Managed Care – PPO

## 2014-07-03 ENCOUNTER — Ambulatory Visit: Payer: BC Managed Care – PPO | Admitting: Internal Medicine

## 2014-07-24 ENCOUNTER — Encounter: Payer: Self-pay | Admitting: Family Medicine

## 2014-07-24 ENCOUNTER — Ambulatory Visit (INDEPENDENT_AMBULATORY_CARE_PROVIDER_SITE_OTHER): Payer: BC Managed Care – PPO | Admitting: Family Medicine

## 2014-07-24 VITALS — BP 120/84 | HR 88 | Temp 99.1°F | Wt 166.0 lb

## 2014-07-24 DIAGNOSIS — E785 Hyperlipidemia, unspecified: Secondary | ICD-10-CM | POA: Diagnosis not present

## 2014-07-24 DIAGNOSIS — M810 Age-related osteoporosis without current pathological fracture: Secondary | ICD-10-CM | POA: Insufficient documentation

## 2014-07-24 DIAGNOSIS — M8580 Other specified disorders of bone density and structure, unspecified site: Secondary | ICD-10-CM | POA: Insufficient documentation

## 2014-07-24 DIAGNOSIS — R7989 Other specified abnormal findings of blood chemistry: Secondary | ICD-10-CM | POA: Diagnosis not present

## 2014-07-24 DIAGNOSIS — K589 Irritable bowel syndrome without diarrhea: Secondary | ICD-10-CM | POA: Insufficient documentation

## 2014-07-24 DIAGNOSIS — R0982 Postnasal drip: Secondary | ICD-10-CM

## 2014-07-24 DIAGNOSIS — E1169 Type 2 diabetes mellitus with other specified complication: Secondary | ICD-10-CM

## 2014-07-24 DIAGNOSIS — Z23 Encounter for immunization: Secondary | ICD-10-CM

## 2014-07-24 DIAGNOSIS — E119 Type 2 diabetes mellitus without complications: Secondary | ICD-10-CM | POA: Diagnosis not present

## 2014-07-24 DIAGNOSIS — I152 Hypertension secondary to endocrine disorders: Secondary | ICD-10-CM

## 2014-07-24 DIAGNOSIS — I10 Essential (primary) hypertension: Secondary | ICD-10-CM | POA: Diagnosis not present

## 2014-07-24 DIAGNOSIS — E1159 Type 2 diabetes mellitus with other circulatory complications: Secondary | ICD-10-CM

## 2014-07-24 DIAGNOSIS — J309 Allergic rhinitis, unspecified: Secondary | ICD-10-CM | POA: Insufficient documentation

## 2014-07-24 DIAGNOSIS — R945 Abnormal results of liver function studies: Secondary | ICD-10-CM

## 2014-07-24 LAB — HEPATIC FUNCTION PANEL
ALBUMIN: 3.9 g/dL (ref 3.5–5.2)
ALT: 67 U/L — ABNORMAL HIGH (ref 0–35)
AST: 60 U/L — AB (ref 0–37)
Alkaline Phosphatase: 89 U/L (ref 39–117)
Bilirubin, Direct: 0.1 mg/dL (ref 0.0–0.3)
Total Bilirubin: 0.3 mg/dL (ref 0.2–1.2)
Total Protein: 8 g/dL (ref 6.0–8.3)

## 2014-07-24 LAB — LIPID PANEL
CHOL/HDL RATIO: 4
Cholesterol: 166 mg/dL (ref 0–200)
HDL: 40.3 mg/dL (ref >39.00)
LDL Cholesterol: 86 mg/dL (ref 0–99)
NonHDL: 125.7
Triglycerides: 197 mg/dL — ABNORMAL HIGH (ref 0.0–149.0)
VLDL: 39.4 mg/dL (ref 0.0–40.0)

## 2014-07-24 LAB — BASIC METABOLIC PANEL
BUN: 14 mg/dL (ref 6–23)
CO2: 32 mEq/L (ref 19–32)
Calcium: 10 mg/dL (ref 8.4–10.5)
Chloride: 104 mEq/L (ref 96–112)
Creatinine, Ser: 0.8 mg/dL (ref 0.4–1.2)
GFR: 77.53 mL/min (ref >60.00)
GLUCOSE: 75 mg/dL (ref 70–99)
POTASSIUM: 4.2 meq/L (ref 3.5–5.1)
Sodium: 144 mEq/L (ref 135–145)

## 2014-07-24 LAB — HEMOGLOBIN A1C: HEMOGLOBIN A1C: 7 % — AB (ref 4.6–6.5)

## 2014-07-24 MED ORDER — MOMETASONE FUROATE 50 MCG/ACT NA SUSP: 2.0000 | Freq: Every day | NASAL | Status: DC

## 2014-07-24 MED ORDER — EZETIMIBE-SIMVASTATIN 10-40 MG PO TABS: 1.0000 | ORAL_TABLET | Freq: Every day | ORAL | Status: DC

## 2014-07-24 MED ORDER — FLUTICASONE PROPIONATE 50 MCG/ACT NA SUSP: 2.0000 | Freq: Every day | NASAL | Status: DC

## 2014-07-24 NOTE — Assessment & Plan Note (Signed)
Previously well-controlled on metformin alone at just 500 mg. Recheck A1c today. Discussed with patient wanted her to push exercise and hopefully get A1c closer to 6

## 2014-07-24 NOTE — Assessment & Plan Note (Signed)
Well-controlled with Vytorin. Continue

## 2014-07-24 NOTE — Progress Notes (Signed)
Candice Reddish, MD Phone: [phone]  Subjective:  Patient presents today to establish care with me as their new primary care provider. Patient was formerly a patient of Dr. Arnoldo Morale. Chief complaint-noted.   DIABETES Type II-reasonable control  Lab Results  Component Value Date   HGBA1C 7.0* 12/24/2013   HGBA1C 6.9* 06/23/2013   HGBA1C 6.7* 02/17/2013   Medications taking and tolerating-yes, metformin Blood Sugars per patient-does not check Diet-tries to eat well Regular Exercise-no On Aspirin-yes On statin-yes Daily foot monitoring-yes  ROS- Denies Polyuria,Polydipsia, nocturia, Vision changes, feet or hand numbness/pain/tingling. Denies  Hypoglycemia symptoms (shaky, sweaty, hungry, weak anxious, tremor, palpitations, confusion, behavior change).   Hypertension-well-controlled BP Readings from Last 3 Encounters:  07/24/14 120/84  06/08/14 120/82  05/18/14 126/82  Compliant with medications-yes without side effects ROS-Denies any CP, HA, SOB, blurry vision.   Hyperlipidemia-controlled Lab Results  Component Value Date   LDLCALC 82 12/24/2013  On statin: Vytorin Regular exercise: No ROS- no chest pain or shortness of breath. No myalgias  Elevated LFTs/likely fatty liver -Patient does not exercise. Weight up 6 pounds since 2013 but fortunately down 5 pounds over last 5 months. ROS-no right upper quadrant pain, no jaundice  The following were reviewed and entered/updated in epic: Past Medical History  Diagnosis Date  . Depression   . GERD (gastroesophageal reflux disease)   . Hyperlipidemia   . Psoriasis     arms, legs, back  . Headache(784.0)     migraines, sinus  . Diabetes mellitus     NIDDM  . Hypertension     under control, has been on med. since 2005  . Benign hematuria   . Snores     denies sx. of sleep apnea  . Dental crowns present   . Hiatal hernia   . Allergy     SEASONAL  . Cataract     LEFT EYE  . Osteopenia    Patient Active Problem List     Diagnosis Date Noted  . Diabetes mellitus type II, controlled, with no complications 66/[id number]/[id number]    Priority: High  . IBS (irritable bowel syndrome) 07/24/2014    Priority: Medium  . Psoriasis 12/13/2010    Priority: Medium  . Hypertension associated with diabetes 12/13/2010    Priority: Medium  . Depression with anxiety 12/13/2010    Priority: Medium  . Fatty liver 12/13/2010    Priority: Medium  . Hyperlipemia 12/13/2010    Priority: Medium  . Osteopenia 07/24/2014    Priority: Low  . Allergic rhinitis 07/24/2014    Priority: Low  . Obesity (BMI 30-39.9) 12/31/2013    Priority: Low  . Benign hematuria 12/13/2010    Priority: Low  . GERD (gastroesophageal reflux disease) 12/13/2010    Priority: Low   Past Surgical History  Procedure Laterality Date  . Dilation and curettage of uterus    . Tonsillectomy    . Left wrist surgery      July 9th 2013  . Hardware removal  07/11/2012    Procedure: HARDWARE REMOVAL;  Surgeon: Cammie Sickle., MD;  Location: [facility];  Service: Orthopedics;  Laterality: Left;  pin removal wire out and debride pisiform  . Trigger finger release  07/11/2012    Procedure: RELEASE TRIGGER FINGER/A-1 PULLEY;  Surgeon: Cammie Sickle., MD;  Location: [facility];  Service: Orthopedics;  Laterality: Left;  LEFT LONG FINGER  . Colonoscopy      Family History  Problem Relation Age of Onset  .  Hyperlipidemia Mother   . Hypertension Mother   . Hypertension Father   . Kidney disease Father   . Anesthesia problems Sister     post-op N/V, hard to wake up post-op  . Diabetes Maternal Uncle     Medications- reviewed and updated Current Outpatient Prescriptions  Medication Sig Dispense Refill  . aspirin 81 MG tablet Take 81 mg by mouth daily.        . Calcium Carbonate (CALTRATE 600) 1500 MG TABS Take by mouth daily.        . citalopram (CELEXA) 20 MG tablet TAKE 1 TABLET DAILY  90 tablet  2  .  ezetimibe-simvastatin (VYTORIN) 10-40 MG per tablet Take 1 tablet by mouth at bedtime.  90 tablet  3  . hyoscyamine (LEVSIN, ANASPAZ) 0.125 MG tablet Take 0.125 mg by mouth every 4 (four) hours as needed.      . metFORMIN (GLUCOPHAGE) 500 MG tablet TAKE 1 TABLET DAILY WITH BREAKFAST  90 tablet  0  . omeprazole (PRILOSEC) 40 MG capsule TAKE 1 CAPSULE DAILY  90 capsule  2  . valsartan-hydrochlorothiazide (DIOVAN-HCT) 80-12.5 MG per tablet TAKE 1 TABLET DAILY  90 tablet  2  . VITAMIN D, ERGOCALCIFEROL, PO Take by mouth daily.      Marland Kitchen aspirin-acetaminophen-caffeine (EXCEDRIN MIGRAINE) 250-250-65 MG per tablet Take 1 tablet by mouth every 6 (six) hours as needed.        . calcipotriene-betamethasone (TACLONEX) ointment Apply topically daily.  60 g  0  . CHERATUSSIN AC 100-10 MG/5ML syrup       . fluticasone (FLONASE) 50 MCG/ACT nasal spray Place 2 sprays into both nostrils daily.  16 g  1  . hyoscyamine (LEVSIN/SL) 0.125 MG SL tablet Place 1 tablet (0.125 mg total) under the tongue every 4 (four) hours as needed for cramping.  90 tablet  3  . mometasone (NASONEX) 50 MCG/ACT nasal spray Place 2 sprays into the nose daily.  17 g  11   No current facility-administered medications for this visit.    Allergies-reviewed and updated Allergies  Allergen Reactions  . Hydrocodone-Acetaminophen Nausea And Vomiting    DIZZINESS  . Oxycodone Nausea Only  . Altace [Ramipril] Cough    History   Social History  . Marital Status: Married    Spouse Name: N/A    Number of Children: N/A  . Years of Education: N/A   Social History Main Topics  . Smoking status: Former Smoker -- 0.10 packs/day for 20 years    Types: Cigarettes    Quit date: 10/16/1998  . Smokeless tobacco: Never Used     Comment: quit smoking in 2000  . Alcohol Use: 0.0 oz/week     Comment: socially, weekends  . Drug Use: No  . Sexual Activity: Yes   Other Topics Concern  . None   Social History Narrative   Moved to [place] in 2011  from [place] due to more affordable.       Married 1972 Mortimer Fries). 2 kids. 5 grandkids. 2 in [place], 3 [place].       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       Hobbies: babysitting, casino    ROS--See HPI   Objective: BP 120/84  Pulse 88  Temp(Src) 99.1 F (37.3 C)  Wt 166 lb (75.297 kg) Gen: NAD, resting comfortably in chair, moves easily to table HEENT: Mucous membranes are moist. Oropharynx normal though Very mild cobblestoning on pharynx CV: RRR  no murmurs rubs or gallops Lungs: CTAB no crackles, wheeze, rhonchi Abdomen: soft/nontender/nondistended/normal bowel sounds.  Ext: no edema Skin: warm, dry Neuro: grossly normal, moves all extremities, PERRLA  DM foot exam normal   Assessment/Plan:  Diabetes mellitus type II, controlled, with no complications Previously well-controlled on metformin alone at just 500 mg. Recheck A1c today. Discussed with patient wanted her to push exercise and hopefully get A1c closer to 6  Hypertension associated with diabetes Well-controlled on Diovan HCT. Continue current meds  Fatty liver  Encouraged patient to start regular exercise. Advised needs to lose weight to reduce risk of fatty liver progression to cirrhosis in the long run  Hyperlipemia Well-controlled with Vytorin. Continue   Return precautions advised.   Nonfasting labs Orders Placed This Encounter  Procedures  . Basic metabolic panel  . Hepatic function panel  . Hemoglobin A1c  . Lipid panel   Refills provided Meds ordered this encounter  Medications  . ezetimibe-simvastatin (VYTORIN) 10-40 MG per tablet    Sig: Take 1 tablet by mouth at bedtime.    Dispense:  90 tablet    Refill:  3  . mometasone (NASONEX) 50 MCG/ACT nasal spray    Sig: Place 2 sprays into the nose daily.    Dispense:  17 g    Refill:  11  . fluticasone (FLONASE) 50 MCG/ACT nasal spray    Sig: Place 2 sprays into both nostrils daily.    Dispense:   16 g    Refill:  1

## 2014-07-24 NOTE — Assessment & Plan Note (Signed)
Encouraged patient to start regular exercise. Advised needs to lose weight to reduce risk of fatty liver progression to cirrhosis in the long run

## 2014-07-24 NOTE — Patient Instructions (Addendum)
Labs today-ask them to release last set (or reenter under my name). Will send mychart message.   Advised 150 minutes exercise per week strongly for your fatty liver and diabetes.   Health Maintenance Due  Topic Date Due  . Foot Exam -today , was fine 02/19/1961  . Ophthalmology Exam -send me a copy please 02[birth year]19/1959  . Influenza Vaccine -today 05/16/2014  . Hemoglobin A1c - today  06/26/2014

## 2014-07-24 NOTE — Assessment & Plan Note (Signed)
Well-controlled on Diovan HCT. Continue current meds

## 2014-07-28 ENCOUNTER — Telehealth: Payer: Self-pay | Admitting: Family Medicine

## 2014-07-28 DIAGNOSIS — R0982 Postnasal drip: Secondary | ICD-10-CM

## 2014-07-28 NOTE — Telephone Encounter (Signed)
Pt stated nasonex is going otc and pharm unable to get med. Please call something else into [facility]

## 2014-07-29 MED ORDER — FLUTICASONE PROPIONATE 50 MCG/ACT NA SUSP: 2.0000 | Freq: Every day | NASAL | Status: DC

## 2014-07-29 NOTE — Telephone Encounter (Signed)
Can i call in flonase?

## 2014-07-29 NOTE — Telephone Encounter (Signed)
yes

## 2014-08-11 DIAGNOSIS — N951 Menopausal and female climacteric states: Secondary | ICD-10-CM | POA: Diagnosis not present

## 2014-08-11 DIAGNOSIS — M8580 Other specified disorders of bone density and structure, unspecified site: Secondary | ICD-10-CM | POA: Diagnosis not present

## 2014-08-11 DIAGNOSIS — Z01419 Encounter for gynecological examination (general) (routine) without abnormal findings: Secondary | ICD-10-CM | POA: Diagnosis not present

## 2014-08-19 ENCOUNTER — Telehealth: Payer: Self-pay | Admitting: Family Medicine

## 2014-08-19 MED ORDER — METFORMIN HCL 500 MG PO TABS: ORAL_TABLET | ORAL | Status: DC

## 2014-08-19 MED ORDER — OMEPRAZOLE 40 MG PO CPDR: DELAYED_RELEASE_CAPSULE | ORAL | Status: DC

## 2014-08-19 NOTE — Telephone Encounter (Signed)
Medications refilled

## 2014-08-19 NOTE — Telephone Encounter (Signed)
[facility] is requesting re-fills on metFORMIN (GLUCOPHAGE) 500 MG tablet and omeprazole (PRILOSEC) 40 MG capsule

## 2014-08-21 ENCOUNTER — Encounter: Payer: Self-pay | Admitting: Family Medicine

## 2014-08-21 DIAGNOSIS — H25013 Cortical age-related cataract, bilateral: Secondary | ICD-10-CM | POA: Diagnosis not present

## 2014-08-21 DIAGNOSIS — H5213 Myopia, bilateral: Secondary | ICD-10-CM | POA: Diagnosis not present

## 2014-08-21 DIAGNOSIS — E119 Type 2 diabetes mellitus without complications: Secondary | ICD-10-CM | POA: Diagnosis not present

## 2014-08-21 DIAGNOSIS — H524 Presbyopia: Secondary | ICD-10-CM | POA: Diagnosis not present

## 2014-08-21 LAB — HM DIABETES EYE EXAM

## 2014-08-31 ENCOUNTER — Encounter: Payer: Self-pay | Admitting: Family Medicine

## 2014-09-17 DIAGNOSIS — L400 Psoriasis vulgaris: Secondary | ICD-10-CM | POA: Diagnosis not present

## 2014-10-21 ENCOUNTER — Telehealth: Payer: Self-pay | Admitting: Family Medicine

## 2014-10-21 MED ORDER — ATORVASTATIN CALCIUM 20 MG PO TABS: 20.0000 mg | ORAL_TABLET | Freq: Every day | ORAL | Status: DC

## 2014-10-21 NOTE — Telephone Encounter (Signed)
Trial atorvastatin 20mg  which I sent in. Cholesterol recheck >6 weeks out from new start.

## 2014-10-21 NOTE — Telephone Encounter (Signed)
See below

## 2014-10-21 NOTE — Telephone Encounter (Signed)
Pt states her insurance company took off ezetimibe-simvastatin (VYTORIN) 10-40 MG per tablet and gave her these options: atovastatin Fluvastatin Lovastatin Pravastatin crestor Pls advise  [facility]  [phone]

## 2014-10-22 NOTE — Telephone Encounter (Signed)
Pt.notified

## 2015-01-26 ENCOUNTER — Encounter: Payer: Self-pay | Admitting: Family Medicine

## 2015-01-26 ENCOUNTER — Ambulatory Visit (INDEPENDENT_AMBULATORY_CARE_PROVIDER_SITE_OTHER): Payer: BLUE CROSS/BLUE SHIELD | Admitting: Family Medicine

## 2015-01-26 VITALS — BP 124/80 | HR 83 | Temp 98.6°F | Wt 162.0 lb

## 2015-01-26 DIAGNOSIS — Z23 Encounter for immunization: Secondary | ICD-10-CM

## 2015-01-26 DIAGNOSIS — E1169 Type 2 diabetes mellitus with other specified complication: Secondary | ICD-10-CM

## 2015-01-26 DIAGNOSIS — I10 Essential (primary) hypertension: Secondary | ICD-10-CM

## 2015-01-26 DIAGNOSIS — K760 Fatty (change of) liver, not elsewhere classified: Secondary | ICD-10-CM

## 2015-01-26 DIAGNOSIS — I152 Hypertension secondary to endocrine disorders: Secondary | ICD-10-CM

## 2015-01-26 DIAGNOSIS — E1159 Type 2 diabetes mellitus with other circulatory complications: Secondary | ICD-10-CM

## 2015-01-26 DIAGNOSIS — E785 Hyperlipidemia, unspecified: Secondary | ICD-10-CM | POA: Diagnosis not present

## 2015-01-26 DIAGNOSIS — E119 Type 2 diabetes mellitus without complications: Secondary | ICD-10-CM

## 2015-01-26 LAB — HEMOGLOBIN A1C: Hgb A1c MFr Bld: 6.8 % — ABNORMAL HIGH (ref 4.6–6.5)

## 2015-01-26 MED ORDER — ATORVASTATIN CALCIUM 20 MG PO TABS: 20.0000 mg | ORAL_TABLET | Freq: Every day | ORAL | Status: DC

## 2015-01-26 MED ORDER — OMEPRAZOLE 40 MG PO CPDR: DELAYED_RELEASE_CAPSULE | ORAL | Status: DC

## 2015-01-26 MED ORDER — CITALOPRAM HYDROBROMIDE 20 MG PO TABS: 20.0000 mg | ORAL_TABLET | Freq: Every day | ORAL | Status: DC

## 2015-01-26 MED ORDER — VALSARTAN-HYDROCHLOROTHIAZIDE 80-12.5 MG PO TABS: 1.0000 | ORAL_TABLET | Freq: Every day | ORAL | Status: DC

## 2015-01-26 NOTE — Assessment & Plan Note (Signed)
vytorin previously-no longer covered 2016. Check lipids at follow up as has not yet transitioned to atorvastatin 20mg  yet.

## 2015-01-26 NOTE — Assessment & Plan Note (Signed)
Controlled on Valsartan 80-hctz 12.5 daily. No changes.

## 2015-01-26 NOTE — Assessment & Plan Note (Signed)
Weight down 4 lbs. Weight goal 130-140 range. LFTs next visit.

## 2015-01-26 NOTE — Patient Instructions (Addendum)
Received final pneumonia shot today (Pnemovax).  Check a1c today, hold off on other labs until we are fully switched to atorvastatin.   WAY TO GO ON WEIGHT LOSS!!!! Keep up the great work. I wonder if you get into the 130-140 range weight if you will still need the metformin even.   Once again, sorry for your loss. Mr Herbert Seta was a great man

## 2015-01-26 NOTE — Assessment & Plan Note (Signed)
Controlled previously. Weight down 4 lbs. Continue metformin 500mg  daily. Check a1c today

## 2015-01-26 NOTE — Progress Notes (Signed)
[name], MD Phone: [phone]  Subjective:   Candice Guerrero is a 64 y.o. year old very pleasant female patient who presents with the following:  DIABETES Type II-reasonable control  Lab Results  Component Value Date   HGBA1C 7.0* 07/24/2014   HGBA1C 7.0* 12/24/2013   HGBA1C 6.9* 06/23/2013  Medications taking and tolerating-yes metformin alone Blood Sugars per patient-fasting-does not check Diet-working on portion size Regular Exercise-exercising on treadmill On Aspirin-yes On statin-yes  ROS- Denies Polyuria,Polydipsia, nocturia, Vision changes. Denies  Hypoglycemia symptoms (shaky, sweaty, hungry, weak anxious, tremor, palpitations, confusion, behavior change).   Hypertension-controlled  BP Readings from Last 3 Encounters:  01/26/15 124/80  07/24/14 120/84  06/08/14 120/82   Home BP monitoring-no Compliant with medications-yes without side effects ROS-Denies any CP, HA, SOB, blurry vision, LE edema  Hyperlipidemia-controlled  Lab Results  Component Value Date   LDLCALC 86 07/24/2014   On statin: previously on vytorin and finishing up course, atorvastatin will be started soon Regular exercise: yes, walking on treadmill, off for 1 week though ROS- no chest pain or shortness of breath. No myalgias  Fatty liver-suspect improving -weight down 4 lbs through exercise and eating better ROS- no RUQ pain  Past Medical History- Patient Active Problem List   Diagnosis Date Noted  . Diabetes mellitus type II, controlled, with no complications 16/94/5038    Priority: High  . IBS (irritable bowel syndrome) 07/24/2014    Priority: Medium  . Psoriasis 12/13/2010    Priority: Medium  . Hypertension associated with diabetes 12/13/2010    Priority: Medium  . Depression with anxiety 12/13/2010    Priority: Medium  . Fatty liver 12/13/2010    Priority: Medium  . Hyperlipemia 12/13/2010    Priority: Medium  . Osteopenia 07/24/2014    Priority: Low  . Allergic  rhinitis 07/24/2014    Priority: Low  . Obesity (BMI 30-39.9) 12/31/2013    Priority: Low  . Benign hematuria 12/13/2010    Priority: Low  . GERD (gastroesophageal reflux disease) 12/13/2010    Priority: Low   Medications- reviewed and updated Current Outpatient Prescriptions  Medication Sig Dispense Refill  . aspirin 81 MG tablet Take 81 mg by mouth daily.      Marland Kitchen aspirin-acetaminophen-caffeine (EXCEDRIN MIGRAINE) 250-250-65 MG per tablet Take 1 tablet by mouth every 6 (six) hours as needed.      Marland Kitchen atorvastatin (LIPITOR) 20 MG tablet Take 1 tablet (20 mg total) by mouth daily. 90 tablet 3  . calcipotriene-betamethasone (TACLONEX) ointment Apply topically daily. 60 g 0  . Calcium Carbonate (CALTRATE 600) 1500 MG TABS Take by mouth daily.      Marland Kitchen CHERATUSSIN AC 100-10 MG/5ML syrup     . citalopram (CELEXA) 20 MG tablet TAKE 1 TABLET DAILY 90 tablet 2  . fluticasone (FLONASE) 50 MCG/ACT nasal spray Place 2 sprays into both nostrils daily. 16 g 1  . hyoscyamine (LEVSIN, ANASPAZ) 0.125 MG tablet Take 0.125 mg by mouth every 4 (four) hours as needed.    . hyoscyamine (LEVSIN/SL) 0.125 MG SL tablet Place 1 tablet (0.125 mg total) under the tongue every 4 (four) hours as needed for cramping. 90 tablet 3  . metFORMIN (GLUCOPHAGE) 500 MG tablet TAKE 1 TABLET DAILY WITH BREAKFAST 90 tablet 0  . mometasone (NASONEX) 50 MCG/ACT nasal spray Place 2 sprays into the nose daily. 17 g 11  . omeprazole (PRILOSEC) 40 MG capsule TAKE 1 CAPSULE DAILY 90 capsule 0  . valsartan-hydrochlorothiazide (DIOVAN-HCT) 80-12.5 MG per tablet TAKE  1 TABLET DAILY 90 tablet 2  . VITAMIN D, ERGOCALCIFEROL, PO Take by mouth daily.     Objective: BP 124/80 mmHg  Pulse 83  Temp(Src) 98.6 F (37 C)  Wt 162 lb (73.483 kg) Gen: NAD, resting comfortably in chair CV: RRR no murmurs rubs or gallops Lungs: CTAB no crackles, wheeze, rhonchi Abdomen: soft/nontender/nondistended/normal bowel sounds. No rebound or guarding.  Ext:  no edema Skin: warm, dry, no rash Neuro: grossly normal, moves all extremities, normal gait  Assessment/Plan:  Diabetes mellitus type II, controlled, with no complications Controlled previously. Weight down 4 lbs. Continue metformin 500mg  daily. Check a1c today   Hypertension associated with diabetes Controlled on Valsartan 80-hctz 12.5 daily. No changes.    Fatty liver Weight down 4 lbs. Weight goal 130-140 range. LFTs next visit.    Hyperlipemia vytorin previously-no longer covered 2016. Check lipids at follow up as has not yet transitioned to atorvastatin 20mg  yet.    6 month follow up.    Orders Placed This Encounter  Procedures  . Pneumococcal polysaccharide vaccine 23-valent greater than or equal to 2yo subcutaneous/IM  . Hemoglobin A1c   Meds ordered this encounter  Medications  . atorvastatin (LIPITOR) 20 MG tablet    Sig: Take 1 tablet (20 mg total) by mouth daily.    Dispense:  90 tablet    Refill:  3  . citalopram (CELEXA) 20 MG tablet    Sig: Take 1 tablet (20 mg total) by mouth daily.    Dispense:  90 tablet    Refill:  3  . omeprazole (PRILOSEC) 40 MG capsule    Sig: TAKE 1 CAPSULE DAILY    Dispense:  90 capsule    Refill:  3  . valsartan-hydrochlorothiazide (DIOVAN-HCT) 80-12.5 MG per tablet    Sig: Take 1 tablet by mouth daily.    Dispense:  90 tablet    Refill:  3

## 2015-03-10 DIAGNOSIS — L821 Other seborrheic keratosis: Secondary | ICD-10-CM | POA: Diagnosis not present

## 2015-03-10 DIAGNOSIS — D1801 Hemangioma of skin and subcutaneous tissue: Secondary | ICD-10-CM | POA: Diagnosis not present

## 2015-03-10 DIAGNOSIS — L400 Psoriasis vulgaris: Secondary | ICD-10-CM | POA: Diagnosis not present

## 2015-03-10 DIAGNOSIS — L814 Other melanin hyperpigmentation: Secondary | ICD-10-CM | POA: Diagnosis not present

## 2015-04-09 ENCOUNTER — Telehealth: Payer: Self-pay | Admitting: Family Medicine

## 2015-04-09 MED ORDER — METFORMIN HCL 500 MG PO TABS: ORAL_TABLET | ORAL | Status: DC

## 2015-04-09 NOTE — Telephone Encounter (Signed)
Pt request refill metFORMIN (GLUCOPHAGE) 500 MG tablet  90 day  to [facility]  Since pt is out and going on vacation on Sunday, can you call  [facility] to get her through

## 2015-04-09 NOTE — Telephone Encounter (Signed)
medication refilled.

## 2015-04-22 ENCOUNTER — Other Ambulatory Visit: Payer: Self-pay

## 2015-04-22 DIAGNOSIS — Z1231 Encounter for screening mammogram for malignant neoplasm of breast: Secondary | ICD-10-CM

## 2015-05-25 ENCOUNTER — Ambulatory Visit
Admission: RE | Admit: 2015-05-25 | Discharge: 2015-05-25 | Disposition: A | Discharge status: Home / Self Care | Payer: Medicare Other | Source: Ambulatory Visit

## 2015-05-25 DIAGNOSIS — Z1231 Encounter for screening mammogram for malignant neoplasm of breast: Secondary | ICD-10-CM

## 2015-06-28 DIAGNOSIS — M545 Low back pain: Secondary | ICD-10-CM | POA: Diagnosis not present

## 2015-06-28 DIAGNOSIS — M5417 Radiculopathy, lumbosacral region: Secondary | ICD-10-CM | POA: Diagnosis not present

## 2015-06-28 DIAGNOSIS — M9905 Segmental and somatic dysfunction of pelvic region: Secondary | ICD-10-CM | POA: Diagnosis not present

## 2015-07-01 DIAGNOSIS — M9905 Segmental and somatic dysfunction of pelvic region: Secondary | ICD-10-CM | POA: Diagnosis not present

## 2015-07-01 DIAGNOSIS — M545 Low back pain: Secondary | ICD-10-CM | POA: Diagnosis not present

## 2015-07-01 DIAGNOSIS — M5417 Radiculopathy, lumbosacral region: Secondary | ICD-10-CM | POA: Diagnosis not present

## 2015-07-06 DIAGNOSIS — M9905 Segmental and somatic dysfunction of pelvic region: Secondary | ICD-10-CM | POA: Diagnosis not present

## 2015-07-06 DIAGNOSIS — M545 Low back pain: Secondary | ICD-10-CM | POA: Diagnosis not present

## 2015-07-06 DIAGNOSIS — M5417 Radiculopathy, lumbosacral region: Secondary | ICD-10-CM | POA: Diagnosis not present

## 2015-07-09 DIAGNOSIS — M545 Low back pain: Secondary | ICD-10-CM | POA: Diagnosis not present

## 2015-07-09 DIAGNOSIS — M9905 Segmental and somatic dysfunction of pelvic region: Secondary | ICD-10-CM | POA: Diagnosis not present

## 2015-07-09 DIAGNOSIS — M5417 Radiculopathy, lumbosacral region: Secondary | ICD-10-CM | POA: Diagnosis not present

## 2015-07-12 DIAGNOSIS — M5417 Radiculopathy, lumbosacral region: Secondary | ICD-10-CM | POA: Diagnosis not present

## 2015-07-12 DIAGNOSIS — M545 Low back pain: Secondary | ICD-10-CM | POA: Diagnosis not present

## 2015-07-12 DIAGNOSIS — M9905 Segmental and somatic dysfunction of pelvic region: Secondary | ICD-10-CM | POA: Diagnosis not present

## 2015-07-19 DIAGNOSIS — M9905 Segmental and somatic dysfunction of pelvic region: Secondary | ICD-10-CM | POA: Diagnosis not present

## 2015-07-19 DIAGNOSIS — M545 Low back pain: Secondary | ICD-10-CM | POA: Diagnosis not present

## 2015-07-19 DIAGNOSIS — M5417 Radiculopathy, lumbosacral region: Secondary | ICD-10-CM | POA: Diagnosis not present

## 2015-07-22 DIAGNOSIS — M5417 Radiculopathy, lumbosacral region: Secondary | ICD-10-CM | POA: Diagnosis not present

## 2015-07-22 DIAGNOSIS — M545 Low back pain: Secondary | ICD-10-CM | POA: Diagnosis not present

## 2015-07-22 DIAGNOSIS — M9905 Segmental and somatic dysfunction of pelvic region: Secondary | ICD-10-CM | POA: Diagnosis not present

## 2015-07-27 ENCOUNTER — Ambulatory Visit: Payer: Medicare Other | Admitting: Family Medicine

## 2015-07-29 DIAGNOSIS — M5417 Radiculopathy, lumbosacral region: Secondary | ICD-10-CM | POA: Diagnosis not present

## 2015-07-29 DIAGNOSIS — M545 Low back pain: Secondary | ICD-10-CM | POA: Diagnosis not present

## 2015-07-29 DIAGNOSIS — M9905 Segmental and somatic dysfunction of pelvic region: Secondary | ICD-10-CM | POA: Diagnosis not present

## 2015-07-30 ENCOUNTER — Ambulatory Visit: Payer: Medicare Other | Admitting: Family Medicine

## 2015-08-02 ENCOUNTER — Ambulatory Visit: Payer: Medicare Other | Admitting: Family Medicine

## 2015-08-09 ENCOUNTER — Ambulatory Visit (INDEPENDENT_AMBULATORY_CARE_PROVIDER_SITE_OTHER): Payer: BLUE CROSS/BLUE SHIELD | Admitting: Family Medicine

## 2015-08-09 ENCOUNTER — Encounter: Payer: Self-pay | Admitting: Family Medicine

## 2015-08-09 VITALS — BP 124/84 | HR 82 | Temp 98.0°F | Wt 157.0 lb

## 2015-08-09 DIAGNOSIS — I10 Essential (primary) hypertension: Secondary | ICD-10-CM

## 2015-08-09 DIAGNOSIS — E785 Hyperlipidemia, unspecified: Secondary | ICD-10-CM | POA: Diagnosis not present

## 2015-08-09 DIAGNOSIS — I152 Hypertension secondary to endocrine disorders: Secondary | ICD-10-CM

## 2015-08-09 DIAGNOSIS — Z23 Encounter for immunization: Secondary | ICD-10-CM | POA: Diagnosis not present

## 2015-08-09 DIAGNOSIS — K760 Fatty (change of) liver, not elsewhere classified: Secondary | ICD-10-CM | POA: Diagnosis not present

## 2015-08-09 DIAGNOSIS — E119 Type 2 diabetes mellitus without complications: Secondary | ICD-10-CM

## 2015-08-09 DIAGNOSIS — E1159 Type 2 diabetes mellitus with other circulatory complications: Secondary | ICD-10-CM | POA: Diagnosis not present

## 2015-08-09 MED ORDER — CYCLOBENZAPRINE HCL 5 MG PO TABS: 5.0000 mg | ORAL_TABLET | Freq: Three times a day (TID) | ORAL | Status: DC | PRN

## 2015-08-09 NOTE — Assessment & Plan Note (Signed)
S: controlled. Valsartan 80-hctz 12.5 daily A/P:Continue current meds. Doing well

## 2015-08-09 NOTE — Progress Notes (Signed)
[name], MD  Subjective:  Candice Guerrero is a 64 y.o. year old very pleasant female patient who presents for/with See problem oriented charting ROS- occasionally mildly woozy before meals - has never checked sugar. Will check. Possible relative hypoglycemia though doubt true as only on metformin. No chest pain, RUQ pain, shortness of breath.   Past Medical History-  Patient Active Problem List   Diagnosis Date Noted  . Diabetes mellitus type II, controlled, with no complications ([place]) 93/71/6967    Priority: High  . IBS (irritable bowel syndrome) 07/24/2014    Priority: Medium  . Psoriasis 12/13/2010    Priority: Medium  . Hypertension associated with diabetes ([place]) 12/13/2010    Priority: Medium  . Depression with anxiety 12/13/2010    Priority: Medium  . Fatty liver 12/13/2010    Priority: Medium  . Hyperlipemia 12/13/2010    Priority: Medium  . Osteopenia 07/24/2014    Priority: Low  . Allergic rhinitis 07/24/2014    Priority: Low  . Obesity (BMI 30-39.9) 12/31/2013    Priority: Low  . Benign hematuria 12/13/2010    Priority: Low  . GERD (gastroesophageal reflux disease) 12/13/2010    Priority: Low    Medications- reviewed and updated Current Outpatient Prescriptions  Medication Sig Dispense Refill  . aspirin 81 MG tablet Take 81 mg by mouth daily.      Marland Kitchen aspirin-acetaminophen-caffeine (EXCEDRIN MIGRAINE) 250-250-65 MG per tablet Take 1 tablet by mouth every 6 (six) hours as needed.      Marland Kitchen atorvastatin (LIPITOR) 20 MG tablet Take 1 tablet (20 mg total) by mouth daily. 90 tablet 3  . calcipotriene-betamethasone (TACLONEX) ointment Apply topically daily. 60 g 0  . Calcium Carbonate (CALTRATE 600) 1500 MG TABS Take by mouth daily.      Marland Kitchen CHERATUSSIN AC 100-10 MG/5ML syrup     . citalopram (CELEXA) 20 MG tablet Take 1 tablet (20 mg total) by mouth daily. 90 tablet 3  . fluticasone (FLONASE) 50 MCG/ACT nasal spray Place 2 sprays into both nostrils daily. 16 g 1  .  hyoscyamine (LEVSIN, ANASPAZ) 0.125 MG tablet Take 0.125 mg by mouth every 4 (four) hours as needed.    . metFORMIN (GLUCOPHAGE) 500 MG tablet TAKE 1 TABLET DAILY WITH BREAKFAST 90 tablet 3  . mometasone (NASONEX) 50 MCG/ACT nasal spray Place 2 sprays into the nose daily. 17 g 11  . omeprazole (PRILOSEC) 40 MG capsule TAKE 1 CAPSULE DAILY 90 capsule 3  . Probiotic Product (ALIGN) 4 MG CAPS Take by mouth.    . valsartan-hydrochlorothiazide (DIOVAN-HCT) 80-12.5 MG per tablet Take 1 tablet by mouth daily. 90 tablet 3  . VITAMIN D, ERGOCALCIFEROL, PO Take by mouth daily.    . cyclobenzaprine (FLEXERIL) 5 MG tablet Take 1 tablet (5 mg total) by mouth 3 (three) times daily as needed for muscle spasms. 30 tablet 1  . hyoscyamine (LEVSIN/SL) 0.125 MG SL tablet Place 1 tablet (0.125 mg total) under the tongue every 4 (four) hours as needed for cramping. 90 tablet 3   No current facility-administered medications for this visit.    Objective: BP 124/84 mmHg  Pulse 82  Temp(Src) 98 F (36.7 C) (Oral)  Wt 157 lb (71.215 kg)  SpO2 98% Gen: NAD, resting comfortably CV: RRR no murmurs rubs or gallops Lungs: CTAB no crackles, wheeze, rhonchi Abdomen: soft/nontender/nondistended/normal bowel sounds.   Ext: no edema Skin: warm, dry Neuro: grossly normal, moves all extremities  Diabetic Foot Exam - Simple   Simple Foot  Form  Diabetic Foot exam was performed with the following findings:  Yes 08/09/2015  4:27 PM  Visual Inspection  No deformities, no ulcerations, no other skin breakdown bilaterally:  Yes  Sensation Testing  Intact to touch and monofilament testing bilaterally:  Yes  Pulse Check  Posterior Tibialis and Dorsalis pulse intact bilaterally:  Yes  Comments     Assessment/Plan:  Diabetes mellitus type II, controlled, with no complications (HCC) S: previously controlled on Metformin 500mg  tablet with breakfast. reduced walking due to sciatica and ankle injury. Worried about sugar being  up as a result A/P: check a1c, adjust metformin if needed. Provided flexeril per patient request as apparently has helped in past with similar sciatica issues to get her more active   Hypertension associated with diabetes S: controlled. Valsartan 80-hctz 12.5 daily A/P:Continue current meds. Doing well   Hyperlipemia S: controlled last visit with LDL 82. Patient had to be changed to atorvastatin as vytorin no longer covered A/P: check direct LDL today as not fasting   Fatty liver S: LFTs minorly elevated < 2x ULN last check A/P: check LFTs as well as hepatitis status and consider vaccinations against A + B     Return precautions advised. 3-6 months depending on a1c  Orders Placed This Encounter  Procedures  . Flu Vaccine QUAD 36+ mos IM  . Hemoglobin A1c    [place]  . Comprehensive metabolic panel    [place]    Order Specific Question:  Has the patient fasted?    Answer:  No  . Hepatitis B surface antigen  . Hepatitis B core antibody, IgM  . Hepatitis B surface antibody  . Hepatitis A antibody, total  . Hepatitis A antibody, IgM  . Hepatitis C antibody, reflex    [facility]  . LDL cholesterol, direct        Meds ordered this encounter  Medications  . Probiotic Product (ALIGN) 4 MG CAPS    Sig: Take by mouth.  . cyclobenzaprine (FLEXERIL) 5 MG tablet    Sig: Take 1 tablet (5 mg total) by mouth 3 (three) times daily as needed for muscle spasms.    Dispense:  30 tablet    Refill:  1

## 2015-08-09 NOTE — Assessment & Plan Note (Signed)
S: LFTs minorly elevated < 2x ULN last check A/P: check LFTs as well as hepatitis status and consider vaccinations against A + B

## 2015-08-09 NOTE — Assessment & Plan Note (Signed)
S: controlled last visit with LDL 82. Patient had to be changed to atorvastatin as vytorin no longer covered A/P: check direct LDL today as not fasting

## 2015-08-09 NOTE — Progress Notes (Signed)
Pre visit review using our clinic review tool, if applicable. No additional management support is needed unless otherwise documented below in the visit note. 

## 2015-08-09 NOTE — Patient Instructions (Addendum)
Flu shot before you leave  Update labs including hepatitis testing  LIkely start immunizations against hepatitis A and B next visit  No changes today, will let you know if any needed after labs  [facility] flexeril helps with the sciatica but if not, discontinue ue

## 2015-08-09 NOTE — Assessment & Plan Note (Signed)
S: previously controlled on Metformin 500mg  tablet with breakfast. reduced walking due to sciatica and ankle injury. Worried about sugar being up as a result A/P: check a1c, adjust metformin if needed. Provided flexeril per patient request as apparently has helped in past with similar sciatica issues to get her more active

## 2015-08-10 LAB — COMPREHENSIVE METABOLIC PANEL
ALBUMIN: 4.2 g/dL (ref 3.5–5.2)
ALT: 39 U/L — ABNORMAL HIGH (ref 0–35)
AST: 32 U/L (ref 0–37)
Alkaline Phosphatase: 91 U/L (ref 39–117)
BUN: 15 mg/dL (ref 6–23)
CHLORIDE: 100 meq/L (ref 96–112)
CO2: 31 meq/L (ref 19–32)
CREATININE: 0.74 mg/dL (ref 0.40–1.20)
Calcium: 9.7 mg/dL (ref 8.4–10.5)
GFR: 83.34 mL/min (ref >60.00)
Glucose, Bld: 89 mg/dL (ref 70–99)
POTASSIUM: 3.3 meq/L — AB (ref 3.5–5.1)
SODIUM: 143 meq/L (ref 135–145)
Total Bilirubin: 0.3 mg/dL (ref 0.2–1.2)
Total Protein: 7.3 g/dL (ref 6.0–8.3)

## 2015-08-10 LAB — HEPATITIS B SURFACE ANTIBODY,QUALITATIVE: HEP B S AB: POSITIVE — AB

## 2015-08-10 LAB — HEPATITIS C ANTIBODY: HCV Ab: NEGATIVE

## 2015-08-10 LAB — HEMOGLOBIN A1C: Hgb A1c MFr Bld: 6.8 % — ABNORMAL HIGH (ref 4.6–6.5)

## 2015-08-10 LAB — HEPATITIS A ANTIBODY, IGM: HEP A IGM: NONREACTIVE

## 2015-08-10 LAB — HEPATITIS B SURFACE ANTIGEN: HEP B S AG: NEGATIVE

## 2015-08-10 LAB — HEPATITIS A ANTIBODY, TOTAL: Hep A Total Ab: NONREACTIVE

## 2015-08-10 LAB — LDL CHOLESTEROL, DIRECT: LDL DIRECT: 140 mg/dL

## 2015-08-10 LAB — HEPATITIS B CORE ANTIBODY, IGM: HEP B C IGM: NONREACTIVE

## 2015-08-11 ENCOUNTER — Encounter: Payer: Self-pay | Admitting: Family Medicine

## 2015-08-26 DIAGNOSIS — H524 Presbyopia: Secondary | ICD-10-CM | POA: Diagnosis not present

## 2015-08-26 DIAGNOSIS — E119 Type 2 diabetes mellitus without complications: Secondary | ICD-10-CM | POA: Diagnosis not present

## 2015-08-26 DIAGNOSIS — H25813 Combined forms of age-related cataract, bilateral: Secondary | ICD-10-CM | POA: Diagnosis not present

## 2015-08-26 DIAGNOSIS — H04123 Dry eye syndrome of bilateral lacrimal glands: Secondary | ICD-10-CM | POA: Diagnosis not present

## 2015-08-26 LAB — HM DIABETES EYE EXAM

## 2015-08-30 ENCOUNTER — Encounter: Payer: Self-pay | Admitting: Family Medicine

## 2015-08-31 ENCOUNTER — Other Ambulatory Visit: Payer: Self-pay | Admitting: Nurse Practitioner

## 2015-08-31 ENCOUNTER — Other Ambulatory Visit (HOSPITAL_COMMUNITY)
Admission: RE | Admit: 2015-08-31 | Discharge: 2015-08-31 | Disposition: A | Discharge status: Home / Self Care | Payer: BLUE CROSS/BLUE SHIELD | Source: Ambulatory Visit | Attending: Nurse Practitioner | Admitting: Nurse Practitioner

## 2015-08-31 DIAGNOSIS — Z01419 Encounter for gynecological examination (general) (routine) without abnormal findings: Secondary | ICD-10-CM | POA: Insufficient documentation

## 2015-08-31 DIAGNOSIS — Z1151 Encounter for screening for human papillomavirus (HPV): Secondary | ICD-10-CM | POA: Diagnosis not present

## 2015-09-02 LAB — CYTOLOGY - PAP

## 2015-09-14 ENCOUNTER — Other Ambulatory Visit: Payer: Self-pay | Admitting: Family Medicine

## 2015-10-19 ENCOUNTER — Telehealth: Payer: Self-pay | Admitting: Family Medicine

## 2015-10-19 NOTE — Telephone Encounter (Signed)
Ok

## 2015-10-19 NOTE — Telephone Encounter (Signed)
Patient states that she has not been feeling good going on 3 weeks. Patient states that she has had a cough, body aches, and has no energy. Is it ok to use a SDA?

## 2015-10-21 ENCOUNTER — Ambulatory Visit (INDEPENDENT_AMBULATORY_CARE_PROVIDER_SITE_OTHER): Payer: BLUE CROSS/BLUE SHIELD | Admitting: Family Medicine

## 2015-10-21 ENCOUNTER — Encounter: Payer: Self-pay | Admitting: Family Medicine

## 2015-10-21 VITALS — BP 132/80 | HR 74 | Temp 98.6°F | Wt 162.0 lb

## 2015-10-21 DIAGNOSIS — J329 Chronic sinusitis, unspecified: Secondary | ICD-10-CM

## 2015-10-21 DIAGNOSIS — A499 Bacterial infection, unspecified: Secondary | ICD-10-CM | POA: Diagnosis not present

## 2015-10-21 DIAGNOSIS — B9689 Other specified bacterial agents as the cause of diseases classified elsewhere: Secondary | ICD-10-CM

## 2015-10-21 MED ORDER — HYOSCYAMINE SULFATE 0.125 MG PO TABS: 0.1250 mg | ORAL_TABLET | ORAL | Status: DC | PRN

## 2015-10-21 MED ORDER — AMOXICILLIN-POT CLAVULANATE 875-125 MG PO TABS: 1.0000 | ORAL_TABLET | Freq: Two times a day (BID) | ORAL | Status: DC

## 2015-10-21 NOTE — Patient Instructions (Signed)
Sinsusitis Bacterial based on: Symptoms >10 days  Treatment: -considered prednisone: we opted out -other symptomatic care with continued rest, cough is improving slightly and not keeping him up at night so no prescription cough medicine -Antibiotic indicated: yes  Finally, we reviewed reasons to return to care including if symptoms worsen or persist or new concerns arise.  Meds ordered this encounter  . amoxicillin-clavulanate (AUGMENTIN) 875-125 MG tablet    Sig: Take 1 tablet by mouth 2 (two) times daily.    Dispense:  14 tablet    Refill:  0

## 2015-10-21 NOTE — Progress Notes (Signed)
PCP: [name], MD  Subjective:  Candice Guerrero is a 65 y.o. year old very pleasant female patient who presents with sinusitis symptoms including nasal congestion, frontal sinus tenderness -other symptoms include: sore throat, coughing up yellow sputum, fatigue -day of illness:>15 -started: December 20th when getting back from [place] -Symptoms show no change -previous treatments: rest, hydration, ibuprofen -sick contacts/travel/risks: denies flu exposure. Husband has not been sick - no history recurrent sinusitis  ROS-denies fever, SOB, NVD, tooth pain  Pertinent Past Medical History-  Patient Active Problem List   Diagnosis Date Noted  . Diabetes mellitus type II, controlled, with no complications ([place]) [id number]    Priority: High  . IBS (irritable bowel syndrome) 07/24/2014    Priority: Medium  . Psoriasis 12/13/2010    Priority: Medium  . Hypertension associated with diabetes ([place]) 12/13/2010    Priority: Medium  . Depression with anxiety 12/13/2010    Priority: Medium  . Fatty liver 12/13/2010    Priority: Medium  . Hyperlipemia 12/13/2010    Priority: Medium  . Osteopenia 07/24/2014    Priority: Low  . Allergic rhinitis 07/24/2014    Priority: Low  . Obesity (BMI 30-39.9) 12/31/2013    Priority: Low  . Benign hematuria 12/13/2010    Priority: Low  . GERD (gastroesophageal reflux disease) 12/13/2010    Priority: Low    Medications- reviewed  Current Outpatient Prescriptions  Medication Sig Dispense Refill  . aspirin 81 MG tablet Take 81 mg by mouth daily.      Marland Kitchen atorvastatin (LIPITOR) 20 MG tablet Take 1 tablet by mouth  daily 90 tablet 2  . calcipotriene-betamethasone (TACLONEX) ointment Apply topically daily. 60 g 0  . Calcium Carbonate (CALTRATE 600) 1500 MG TABS Take by mouth daily.      . citalopram (CELEXA) 20 MG tablet Take 1 tablet by mouth  daily 90 tablet 2  . fluticasone (FLONASE) 50 MCG/ACT nasal spray Place 2 sprays into both nostrils daily. 16 g 1   . hyoscyamine (LEVSIN, ANASPAZ) 0.125 MG tablet Take 1 tablet (0.125 mg total) by mouth every 4 (four) hours as needed. 30 tablet 1  . metFORMIN (GLUCOPHAGE) 500 MG tablet TAKE 1 TABLET DAILY WITH BREAKFAST 90 tablet 3  . mometasone (NASONEX) 50 MCG/ACT nasal spray Place 2 sprays into the nose daily. 17 g 11  . omeprazole (PRILOSEC) 40 MG capsule Take 1 capsule by mouth  daily 90 capsule 2  . Probiotic Product (ALIGN) 4 MG CAPS Take by mouth.    . valsartan-hydrochlorothiazide (DIOVAN-HCT) 80-12.5 MG tablet Take 1 tablet by mouth  daily 90 tablet 2  . VITAMIN D, ERGOCALCIFEROL, PO Take by mouth daily.    Marland Kitchen aspirin-acetaminophen-caffeine (EXCEDRIN MIGRAINE) 250-250-65 MG per tablet Take 1 tablet by mouth every 6 (six) hours as needed. Reported on 10/21/2015    . cyclobenzaprine (FLEXERIL) 5 MG tablet Take 1 tablet (5 mg total) by mouth 3 (three) times daily as needed for muscle spasms. (Patient not taking: Reported on 10/21/2015) 30 tablet 1  . hyoscyamine (LEVSIN/SL) 0.125 MG SL tablet Place 1 tablet (0.125 mg total) under the tongue every 4 (four) hours as needed for cramping. 90 tablet 3   No current facility-administered medications for this visit.    Objective: BP 132/80 mmHg  Pulse 74  Temp(Src) 98.6 F (37 C)  Wt 162 lb (73.483 kg)  SpO2 97% Gen: NAD, resting comfortably HEENT: Turbinates erythematous with yellow drainage, TM normal, pharynx mildly erythematous with no tonsilar exudate or edema,  frontal bilateral sinus tenderness CV: RRR no murmurs rubs or gallops Lungs: CTAB no crackles, wheeze, rhonchi Abdomen: soft/nontender/nondistended/normal bowel sounds. No rebound or guarding.  Ext: no edema Skin: warm, dry, no rash Neuro: grossly normal, moves all extremities  Assessment/Plan:  Sinsusitis Bacterial based on: Symptoms >10 days  Treatment: -considered prednisone: patient opted out -other symptomatic care with continued rest, cough is improving slightly and not keeping  him up at night -Antibiotic indicated: yes  Finally, we reviewed reasons to return to care including if symptoms worsen or persist or new concerns arise.  Meds ordered this encounter  . amoxicillin-clavulanate (AUGMENTIN) 875-125 MG tablet    Sig: Take 1 tablet by mouth 2 (two) times daily.    Dispense:  14 tablet    Refill:  0

## 2015-11-16 ENCOUNTER — Other Ambulatory Visit: Payer: Self-pay | Admitting: Family Medicine

## 2016-02-28 ENCOUNTER — Other Ambulatory Visit: Payer: Medicare Other

## 2016-02-28 ENCOUNTER — Ambulatory Visit (INDEPENDENT_AMBULATORY_CARE_PROVIDER_SITE_OTHER): Payer: BLUE CROSS/BLUE SHIELD | Admitting: Family Medicine

## 2016-02-28 ENCOUNTER — Encounter: Payer: Self-pay | Admitting: Family Medicine

## 2016-02-28 ENCOUNTER — Telehealth: Payer: Self-pay | Admitting: Family Medicine

## 2016-02-28 VITALS — BP 114/80 | HR 84 | Temp 98.7°F | Ht 60.5 in | Wt 166.0 lb

## 2016-02-28 DIAGNOSIS — E1159 Type 2 diabetes mellitus with other circulatory complications: Secondary | ICD-10-CM | POA: Diagnosis not present

## 2016-02-28 DIAGNOSIS — E119 Type 2 diabetes mellitus without complications: Secondary | ICD-10-CM

## 2016-02-28 DIAGNOSIS — I10 Essential (primary) hypertension: Secondary | ICD-10-CM

## 2016-02-28 DIAGNOSIS — I152 Hypertension secondary to endocrine disorders: Secondary | ICD-10-CM

## 2016-02-28 DIAGNOSIS — Z0000 Encounter for general adult medical examination without abnormal findings: Secondary | ICD-10-CM

## 2016-02-28 LAB — POC URINALSYSI DIPSTICK (AUTOMATED)
Bilirubin, UA: NEGATIVE
Glucose, UA: NEGATIVE
Ketones, UA: NEGATIVE
Leukocytes, UA: NEGATIVE
Nitrite, UA: NEGATIVE
PROTEIN UA: NEGATIVE
SPEC GRAV UA: 1.02
UROBILINOGEN UA: 0.2
pH, UA: 7

## 2016-02-28 LAB — CBC WITH DIFFERENTIAL/PLATELET
BASOS PCT: 0.5 % (ref 0.0–3.0)
Basophils Absolute: 0 10*3/uL (ref 0.0–0.1)
EOS ABS: 0.1 10*3/uL (ref 0.0–0.7)
Eosinophils Relative: 1.3 % (ref 0.0–5.0)
HEMATOCRIT: 42.4 % (ref 36.0–46.0)
HEMOGLOBIN: 14.1 g/dL (ref 12.0–15.0)
LYMPHS PCT: 33.1 % (ref 12.0–46.0)
Lymphs Abs: 2.6 10*3/uL (ref 0.7–4.0)
MCHC: 33.2 g/dL (ref 30.0–36.0)
MCV: 88.7 fl (ref 78.0–100.0)
MONOS PCT: 11.3 % (ref 3.0–12.0)
Monocytes Absolute: 0.9 10*3/uL (ref 0.1–1.0)
Neutro Abs: 4.3 10*3/uL (ref 1.4–7.7)
Neutrophils Relative %: 53.8 % (ref 43.0–77.0)
Platelets: 317 10*3/uL (ref 150.0–400.0)
RBC: 4.78 Mil/uL (ref 3.87–5.11)
RDW: 13.3 % (ref 11.5–15.5)
WBC: 7.9 10*3/uL (ref 4.0–10.5)

## 2016-02-28 LAB — BASIC METABOLIC PANEL
BUN: 17 mg/dL (ref 6–23)
CHLORIDE: 101 meq/L (ref 96–112)
CO2: 32 mEq/L (ref 19–32)
CREATININE: 0.77 mg/dL (ref 0.40–1.20)
Calcium: 9.5 mg/dL (ref 8.4–10.5)
GFR: 79.47 mL/min (ref >60.00)
Glucose, Bld: 123 mg/dL — ABNORMAL HIGH (ref 70–99)
POTASSIUM: 4.4 meq/L (ref 3.5–5.1)
SODIUM: 141 meq/L (ref 135–145)

## 2016-02-28 LAB — LIPID PANEL
CHOL/HDL RATIO: 5
Cholesterol: 189 mg/dL (ref 0–200)
HDL: 38.3 mg/dL — ABNORMAL LOW (ref >39.00)
LDL Cholesterol: 119 mg/dL — ABNORMAL HIGH (ref 0–99)
NONHDL: 150.23
TRIGLYCERIDES: 154 mg/dL — AB (ref 0.0–149.0)
VLDL: 30.8 mg/dL (ref 0.0–40.0)

## 2016-02-28 LAB — TSH: TSH: 1.99 u[IU]/mL (ref 0.35–4.50)

## 2016-02-28 LAB — MICROALBUMIN / CREATININE URINE RATIO
Creatinine,U: 100.6 mg/dL
MICROALB UR: 2.8 mg/dL — AB (ref 0.0–1.9)
Microalb Creat Ratio: 2.8 mg/g (ref 0.0–30.0)

## 2016-02-28 LAB — HEMOGLOBIN A1C: Hgb A1c MFr Bld: 7.1 % — ABNORMAL HIGH (ref 4.6–6.5)

## 2016-02-28 LAB — HEPATIC FUNCTION PANEL
ALK PHOS: 94 U/L (ref 39–117)
ALT: 29 U/L (ref 0–35)
AST: 25 U/L (ref 0–37)
Albumin: 4.2 g/dL (ref 3.5–5.2)
BILIRUBIN DIRECT: 0.1 mg/dL (ref 0.0–0.3)
TOTAL PROTEIN: 6.7 g/dL (ref 6.0–8.3)
Total Bilirubin: 0.6 mg/dL (ref 0.2–1.2)

## 2016-02-28 NOTE — Patient Instructions (Addendum)
Overall things look great.   My only concerns are related to lifestyle choices. We need to increase the exercise back up (target 150 minutes a week), watch portion size, increase the veggies in your diet. Good job already on drink choices.   A1c is up to 7.1 so hopefully this can help reverse trend and also cholesterol up some.   No other changes  I would also like for you to sign up for an annual wellness visit on a Friday with our nurse Manuela Schwartz. This is a free benefit under medicare that may help [place] find additional ways to help you.

## 2016-02-28 NOTE — Telephone Encounter (Signed)
Pt would like to know if ok to come in a week prior to her 6 mo fup in [place] and have her A1c and lipid panel done?   Pt states then you could discuss results, and a plan of care. Is that OK?

## 2016-02-28 NOTE — Telephone Encounter (Signed)
Can order direct LDL, a1c, cbc, bmet all under diabetes for her for future labs and schedule lab visit

## 2016-02-28 NOTE — Addendum Note (Signed)
Addended by: [name] on: 02/28/2016 05:20 PM   Modules accepted: Miquel Dunn

## 2016-02-28 NOTE — Progress Notes (Signed)
Phone: [phone]  Subjective:  Patient presents today for their annual physical. Chief complaint-noted.   See problem oriented charting- ROS- full  review of systems was completed and negative including No chest pain or shortness of breath. No headache or blurry vision.   The following were reviewed and entered/updated in epic: Past Medical History  Diagnosis Date  . Depression   . GERD (gastroesophageal reflux disease)   . Hyperlipidemia   . Psoriasis     arms, legs, back  . Headache(784.0)     migraines, sinus  . Diabetes mellitus     NIDDM  . Hypertension     under control, has been on med. since 2005  . Benign hematuria   . Snores     denies sx. of sleep apnea  . Dental crowns present   . Hiatal hernia   . Allergy     SEASONAL  . Cataract     LEFT EYE  . Osteopenia    Patient Active Problem List   Diagnosis Date Noted  . Diabetes mellitus type II, controlled, with no complications ([place]) 123456    Priority: High  . IBS (irritable bowel syndrome) 07/24/2014    Priority: Medium  . Psoriasis 12/13/2010    Priority: Medium  . Hypertension associated with diabetes ([place]) 12/13/2010    Priority: Medium  . Depression with anxiety 12/13/2010    Priority: Medium  . Fatty liver 12/13/2010    Priority: Medium  . Hyperlipemia 12/13/2010    Priority: Medium  . Osteopenia 07/24/2014    Priority: Low  . Allergic rhinitis 07/24/2014    Priority: Low  . Obesity (BMI 30-39.9) 12/31/2013    Priority: Low  . Benign hematuria 12/13/2010    Priority: Low  . GERD (gastroesophageal reflux disease) 12/13/2010    Priority: Low   Past Surgical History  Procedure Laterality Date  . Dilation and curettage of uterus    . Tonsillectomy    . Left wrist surgery      July 9th 2013  . Hardware removal  07/11/2012    Procedure: HARDWARE REMOVAL;  Surgeon: Cammie Sickle., MD;  Location: [facility];  Service: Orthopedics;  Laterality: Left;  pin removal wire  out and debride pisiform  . Trigger finger release  07/11/2012    Procedure: RELEASE TRIGGER FINGER/A-1 PULLEY;  Surgeon: Cammie Sickle., MD;  Location: [facility];  Service: Orthopedics;  Laterality: Left;  LEFT LONG FINGER  . Colonoscopy      Family History  Problem Relation Age of Onset  . Hyperlipidemia Mother   . Hypertension Mother   . Hypertension Father   . Kidney disease Father   . Anesthesia problems Sister     post-op N/V, hard to wake up post-op  . Diabetes Maternal Uncle     Medications- reviewed and updated Current Outpatient Prescriptions  Medication Sig Dispense Refill  . aspirin 81 MG tablet Take 81 mg by mouth daily.      Marland Kitchen aspirin-acetaminophen-caffeine (EXCEDRIN MIGRAINE) 250-250-65 MG per tablet Take 1 tablet by mouth every 6 (six) hours as needed. Reported on 10/21/2015    . atorvastatin (LIPITOR) 20 MG tablet Take 1 tablet by mouth  daily 90 tablet 2  . calcipotriene-betamethasone (TACLONEX) ointment Apply topically daily. 60 g 0  . Calcium Carbonate (CALTRATE 600) 1500 MG TABS Take by mouth daily.      . citalopram (CELEXA) 20 MG tablet Take 1 tablet by mouth  daily 90 tablet  2  . cyclobenzaprine (FLEXERIL) 5 MG tablet Take 1 tablet (5 mg total) by mouth 3 (three) times daily as needed for muscle spasms. 30 tablet 1  . hyoscyamine (LEVSIN, ANASPAZ) 0.125 MG tablet Take 1 tablet (0.125 mg total) by mouth every 4 (four) hours as needed. 30 tablet 1  . mometasone (NASONEX) 50 MCG/ACT nasal spray Place 2 sprays into the nose daily. 17 g 11  . omeprazole (PRILOSEC) 40 MG capsule Take 1 capsule by mouth  daily 90 capsule 2  . Probiotic Product (ALIGN) 4 MG CAPS Take by mouth.    . valsartan-hydrochlorothiazide (DIOVAN-HCT) 80-12.5 MG tablet Take 1 tablet by mouth  daily 90 tablet 2  . VITAMIN D, ERGOCALCIFEROL, PO Take by mouth daily.     Allergies-reviewed and updated Allergies  Allergen Reactions  . Hydrocodone-Acetaminophen Nausea And  Vomiting    DIZZINESS  . Oxycodone Nausea Only  . Altace [Ramipril] Cough    Social History   Social History  . Marital Status: Married    Spouse Name: N/A  . Number of Children: N/A  . Years of Education: N/A   Social History Main Topics  . Smoking status: Former Smoker -- 0.10 packs/day for 20 years    Types: Cigarettes    Quit date: 10/16/1998  . Smokeless tobacco: Never Used     Comment: quit smoking in 2000  . Alcohol Use: 0.0 oz/week     Comment: socially, weekends  . Drug Use: No  . Sexual Activity: Yes   Other Topics Concern  . None   Social History Narrative   Moved to [place] in 2011 from [place] due to more affordable.       Married 1972 Mortimer Fries). 2 kids. 5 grandkids. 2 in [place], 3 [place].       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       Hobbies: babysitting, casino    Objective: BP 114/80 mmHg  Pulse 84  Temp(Src) 98.7 F (37.1 C) (Oral)  Ht 5' 0.5" (1.537 m)  Wt 166 lb (75.297 kg)  BMI 31.87 kg/m2 Gen: NAD, resting comfortably HEENT: Mucous membranes are moist. Oropharynx normal Neck: no thyromegaly CV: RRR no murmurs rubs or gallops Lungs: CTAB no crackles, wheeze, rhonchi Abdomen: soft/nontender/nondistended/normal bowel sounds. No rebound or guarding.  Ext: no edema Skin: warm, dry Neuro: grossly normal, moves all extremities, PERRLA  Assessment/Plan:  65 y.o. female presenting for annual physical.  Health Maintenance counseling: 1. Anticipatory guidance: Patient counseled regarding regular dental exams, eye exams, wearing seatbelts.  2. Risk factor reduction:  Advised patient of need for regular exercise and diet rich and fruits and vegetables to reduce risk of heart attack and stroke.  3. Immunizations/screenings/ancillary studies- up to date Immunization History  Administered Date(s) Administered  . Influenza Split 08/09/2011, 08/13/2012  . Influenza,inj,Quad PF,36+ Mos 06/30/2013, 07/24/2014,  08/09/2015  . Pneumococcal Conjugate-13 12/31/2013  . Pneumococcal Polysaccharide-23 01/26/2015  . Tdap 10/16/2002, 02/24/2013  . Zoster 12/13/2010    4. Cervical cancer screening- aged out, had abnormal but then 6 normal after that point 5. Breast cancer screening-  breast exam through [facility] and mammogram 05/25/15 6. Colon cancer screening - 04/2014 up to date so 10 year follow up 7. Skin cancer screening- sees regularly for psoriasis  Status of chronic or acute concerns   1. Diabetes reasonably controlled on metformin at breakfast alone but worsened slightly from previous, weight up 4 lbs Lab Results  Component Value Date  HGBA1C 7.1* 02/28/2016   2. Hypertension associated with diabetes- controlled on valsartan 80-hctz 12.5mg   3. Depression - celexa 20mg  and controlled with phq2 of 0  4. Hyperlipidemia- poor control atorvastatin 20mg , weight up- we decided to focus on weight changes and not change medication at this point givne prior LFT elevations now normalized Lab Results  Component Value Date   CHOL 189 02/28/2016   HDL 38.30* 02/28/2016   LDLCALC 119* 02/28/2016   LDLDIRECT 140.0 08/09/2015   TRIG 154.0* 02/28/2016   CHOLHDL 5 02/28/2016   5. IBS- reasonable control on hyoscyamine as needed  6. Hematuria- ongoing issue with prior urology evaluation- found to be small benign cyst of kidney  7. GERD_ controlled on omeprazole  Return in about 6 months (around 08/30/2016) for follow up- or sooner if needed. Return precautions advised.   Orders Placed This Encounter  Procedures  . Microalbumin / creatinine urine ratio  . Hemoglobin A1c   [name], MD

## 2016-02-29 ENCOUNTER — Other Ambulatory Visit: Payer: Self-pay | Admitting: Family Medicine

## 2016-02-29 DIAGNOSIS — E119 Type 2 diabetes mellitus without complications: Secondary | ICD-10-CM

## 2016-02-29 NOTE — Telephone Encounter (Signed)
Spoke with patient. She will call back and set up appointment for labs prior to appointment with Dr. Yong Channel. Put in future orders for LDL, A1C, CBC and Bmet per Dr. Yong Channel.

## 2016-04-24 ENCOUNTER — Other Ambulatory Visit: Payer: Self-pay | Admitting: Family Medicine

## 2016-04-24 DIAGNOSIS — Z1231 Encounter for screening mammogram for malignant neoplasm of breast: Secondary | ICD-10-CM

## 2016-04-25 ENCOUNTER — Other Ambulatory Visit: Payer: Self-pay

## 2016-04-25 MED ORDER — OMEPRAZOLE 40 MG PO CPDR: DELAYED_RELEASE_CAPSULE | ORAL | Status: DC

## 2016-04-25 MED ORDER — ATORVASTATIN CALCIUM 20 MG PO TABS: ORAL_TABLET | ORAL | Status: DC

## 2016-04-27 ENCOUNTER — Telehealth: Payer: Self-pay | Admitting: Emergency Medicine

## 2016-04-27 NOTE — Telephone Encounter (Signed)
Pt requesting refill on Celexa last filled 09/14/15, 90 tabs with two refills. Valsartan last filled 09/14/15, 90 tabs 2 refills. Okay to fill?

## 2016-04-28 ENCOUNTER — Other Ambulatory Visit: Payer: Self-pay

## 2016-04-28 ENCOUNTER — Other Ambulatory Visit: Payer: Self-pay | Admitting: Emergency Medicine

## 2016-04-28 MED ORDER — VALSARTAN-HYDROCHLOROTHIAZIDE 80-12.5 MG PO TABS: ORAL_TABLET | ORAL | Status: DC

## 2016-04-28 MED ORDER — CITALOPRAM HYDROBROMIDE 20 MG PO TABS: ORAL_TABLET | ORAL | Status: DC

## 2016-04-28 NOTE — Telephone Encounter (Signed)
Yes thanks, may fill both and I am ok with 90 and 3 refills

## 2016-05-26 ENCOUNTER — Ambulatory Visit
Admission: RE | Admit: 2016-05-26 | Discharge: 2016-05-26 | Disposition: A | Discharge status: Home / Self Care | Payer: BLUE CROSS/BLUE SHIELD | Source: Ambulatory Visit | Attending: Family Medicine | Admitting: Family Medicine

## 2016-05-26 DIAGNOSIS — Z1231 Encounter for screening mammogram for malignant neoplasm of breast: Secondary | ICD-10-CM

## 2016-06-30 ENCOUNTER — Other Ambulatory Visit: Payer: Self-pay | Admitting: Family Medicine

## 2016-08-17 ENCOUNTER — Other Ambulatory Visit (INDEPENDENT_AMBULATORY_CARE_PROVIDER_SITE_OTHER): Payer: BLUE CROSS/BLUE SHIELD

## 2016-08-17 ENCOUNTER — Ambulatory Visit (INDEPENDENT_AMBULATORY_CARE_PROVIDER_SITE_OTHER): Payer: Medicare Other

## 2016-08-17 DIAGNOSIS — Z23 Encounter for immunization: Secondary | ICD-10-CM

## 2016-08-17 DIAGNOSIS — E119 Type 2 diabetes mellitus without complications: Secondary | ICD-10-CM

## 2016-08-17 LAB — LIPID PANEL
Cholesterol: 199 mg/dL (ref 0–200)
HDL: 48.9 mg/dL (ref >39.00)
LDL Cholesterol: 117 mg/dL — ABNORMAL HIGH (ref 0–99)
NonHDL: 149.84
TRIGLYCERIDES: 165 mg/dL — AB (ref 0.0–149.0)
Total CHOL/HDL Ratio: 4
VLDL: 33 mg/dL (ref 0.0–40.0)

## 2016-08-17 LAB — CBC WITH DIFFERENTIAL/PLATELET
BASOS ABS: 0 10*3/uL (ref 0.0–0.1)
Basophils Relative: 0.4 % (ref 0.0–3.0)
EOS ABS: 0.1 10*3/uL (ref 0.0–0.7)
Eosinophils Relative: 1.4 % (ref 0.0–5.0)
HEMATOCRIT: 42.5 % (ref 36.0–46.0)
HEMOGLOBIN: 14.1 g/dL (ref 12.0–15.0)
LYMPHS PCT: 34.1 % (ref 12.0–46.0)
Lymphs Abs: 2.5 10*3/uL (ref 0.7–4.0)
MCHC: 33.2 g/dL (ref 30.0–36.0)
MCV: 89.2 fl (ref 78.0–100.0)
MONO ABS: 0.7 10*3/uL (ref 0.1–1.0)
Monocytes Relative: 10 % (ref 3.0–12.0)
Neutro Abs: 4 10*3/uL (ref 1.4–7.7)
Neutrophils Relative %: 54.1 % (ref 43.0–77.0)
Platelets: 317 10*3/uL (ref 150.0–400.0)
RBC: 4.77 Mil/uL (ref 3.87–5.11)
RDW: 13.8 % (ref 11.5–15.5)
WBC: 7.4 10*3/uL (ref 4.0–10.5)

## 2016-08-17 LAB — BASIC METABOLIC PANEL
BUN: 14 mg/dL (ref 6–23)
CALCIUM: 9.4 mg/dL (ref 8.4–10.5)
CHLORIDE: 101 meq/L (ref 96–112)
CO2: 31 meq/L (ref 19–32)
Creatinine, Ser: 0.7 mg/dL (ref 0.40–1.20)
GFR: 88.58 mL/min (ref >60.00)
Glucose, Bld: 130 mg/dL — ABNORMAL HIGH (ref 70–99)
POTASSIUM: 4.4 meq/L (ref 3.5–5.1)
SODIUM: 140 meq/L (ref 135–145)

## 2016-08-17 LAB — HEMOGLOBIN A1C: HEMOGLOBIN A1C: 6.9 % — AB (ref 4.6–6.5)

## 2016-08-22 ENCOUNTER — Other Ambulatory Visit: Payer: BLUE CROSS/BLUE SHIELD

## 2016-08-28 ENCOUNTER — Ambulatory Visit: Payer: BLUE CROSS/BLUE SHIELD | Admitting: Family Medicine

## 2016-08-29 ENCOUNTER — Ambulatory Visit (INDEPENDENT_AMBULATORY_CARE_PROVIDER_SITE_OTHER): Payer: BLUE CROSS/BLUE SHIELD | Admitting: Family Medicine

## 2016-08-29 ENCOUNTER — Encounter: Payer: Self-pay | Admitting: Family Medicine

## 2016-08-29 ENCOUNTER — Other Ambulatory Visit: Payer: Self-pay

## 2016-08-29 VITALS — BP 128/82 | HR 89 | Temp 98.4°F | Wt 168.2 lb

## 2016-08-29 DIAGNOSIS — M25512 Pain in left shoulder: Secondary | ICD-10-CM | POA: Diagnosis not present

## 2016-08-29 DIAGNOSIS — I152 Hypertension secondary to endocrine disorders: Secondary | ICD-10-CM

## 2016-08-29 DIAGNOSIS — E119 Type 2 diabetes mellitus without complications: Secondary | ICD-10-CM

## 2016-08-29 DIAGNOSIS — I10 Essential (primary) hypertension: Secondary | ICD-10-CM

## 2016-08-29 DIAGNOSIS — E1159 Type 2 diabetes mellitus with other circulatory complications: Secondary | ICD-10-CM | POA: Diagnosis not present

## 2016-08-29 DIAGNOSIS — E785 Hyperlipidemia, unspecified: Secondary | ICD-10-CM

## 2016-08-29 MED ORDER — METHYLPREDNISOLONE ACETATE 80 MG/ML IJ SUSP
80.0000 mg | Freq: Once | INTRAMUSCULAR | Status: AC
  Administered 2016-08-29: 80 mg via INTRA_ARTICULAR

## 2016-08-29 MED ORDER — METHYLPREDNISOLONE ACETATE 80 MG/ML IJ SUSP: 80.0000 mg | Freq: Once | INTRAMUSCULAR | Status: DC

## 2016-08-29 NOTE — Assessment & Plan Note (Addendum)
S: reasonably controlled. On metformin 500mg  Lab Results  Component Value Date   HGBA1C 6.9 (H) 08/17/2016   HGBA1C 7.1 (H) 02/28/2016   HGBA1C 6.8 (H) 08/09/2015   A/P: continue current medications discussed need for weight loss with weight up 6 lbs to avoid additional medication need in future. Discussed risk of steroid shots for her blood sugars

## 2016-08-29 NOTE — Patient Instructions (Addendum)
No change for diabetes medicine though steroid shot may raise levels slightly over next few weeks.   Weight trending up- consider your old meal plans or weight watchers. Regular exercise 150 minutes can help but diet really is the key thing.   We will try to avoid cholesterol medicine  Trial steroid injection left shoulder. Follow up about 4 weeks- if doing [facility] better but right shoulder does not respond- we can inject at that time. If you are no better- I will refer you to our sports medicine doctor (can just mychart me)

## 2016-08-29 NOTE — Assessment & Plan Note (Signed)
S: controlled on Valsartan 80-hctz 12.5 daily BP Readings from Last 3 Encounters:  08/29/16 128/82  02/28/16 114/80  10/21/15 132/80  A/P:Continue current meds:  Doing well

## 2016-08-29 NOTE — Progress Notes (Signed)
Pre visit review using our clinic review tool, if applicable. No additional management support is needed unless otherwise documented below in the visit note. 

## 2016-08-29 NOTE — Assessment & Plan Note (Signed)
S: poorly controlled on atorvastatin 20mg . No myalgias.  Lab Results  Component Value Date   CHOL 199 08/17/2016   HDL 48.90 08/17/2016   LDLCALC 117 (H) 08/17/2016   LDLDIRECT 140.0 08/09/2015   TRIG 165.0 (H) 08/17/2016   CHOLHDL 4 08/17/2016   A/P: with LFT elevations in past, we have opted to push for further weight loss to see if we can get LDL to goal <70. Encouraged patient on needed changes.

## 2016-08-29 NOTE — Progress Notes (Signed)
Subjective:  Candice Guerrero is a 65 y.o. year old very pleasant female patient who presents for/with See problem oriented charting ROS- no chest pain or shortness of breath, no hypoglycemia.see any ROS included in HPI as well.   Past Medical History-  Patient Active Problem List   Diagnosis Date Noted  . Diabetes mellitus type II, controlled, with no complications ([place]) 123456    Priority: High  . IBS (irritable bowel syndrome) 07/24/2014    Priority: Medium  . Psoriasis 12/13/2010    Priority: Medium  . Hypertension associated with diabetes ([place]) 12/13/2010    Priority: Medium  . Depression with anxiety 12/13/2010    Priority: Medium  . Fatty liver 12/13/2010    Priority: Medium  . Hyperlipemia 12/13/2010    Priority: Medium  . Osteopenia 07/24/2014    Priority: Low  . Allergic rhinitis 07/24/2014    Priority: Low  . Obesity (BMI 30-39.9) 12/31/2013    Priority: Low  . Benign hematuria 12/13/2010    Priority: Low  . GERD (gastroesophageal reflux disease) 12/13/2010    Priority: Low    Medications- reviewed and updated Current Outpatient Prescriptions  Medication Sig Dispense Refill  . aspirin 81 MG tablet Take 81 mg by mouth daily.      Marland Kitchen aspirin-acetaminophen-caffeine (EXCEDRIN MIGRAINE) 250-250-65 MG per tablet Take 1 tablet by mouth every 6 (six) hours as needed. Reported on 10/21/2015    . atorvastatin (LIPITOR) 20 MG tablet Take 1 tablet by mouth  daily 90 tablet 2  . calcipotriene-betamethasone (TACLONEX) ointment Apply topically daily. 60 g 0  . Calcium Carbonate (CALTRATE 600) 1500 MG TABS Take by mouth daily.      . citalopram (CELEXA) 20 MG tablet Take 1 tablet by mouth  daily 90 tablet 2  . cyclobenzaprine (FLEXERIL) 5 MG tablet Take 1 tablet (5 mg total) by mouth 3 (three) times daily as needed for muscle spasms. 30 tablet 1  . hyoscyamine (LEVSIN, ANASPAZ) 0.125 MG tablet Take 1 tablet (0.125 mg total) by mouth every 4 (four) hours as needed. 30 tablet 1  .  metFORMIN (GLUCOPHAGE) 500 MG tablet Take 1 tablet by mouth  daily with breakfast 90 tablet 0  . mometasone (NASONEX) 50 MCG/ACT nasal spray Place 2 sprays into the nose daily. 17 g 11  . omeprazole (PRILOSEC) 40 MG capsule Take 1 capsule by mouth  daily 90 capsule 2  . Probiotic Product (ALIGN) 4 MG CAPS Take by mouth.    . valsartan-hydrochlorothiazide (DIOVAN-HCT) 80-12.5 MG tablet Take 1 tablet by mouth  daily 90 tablet 2  . VITAMIN D, ERGOCALCIFEROL, PO Take by mouth daily.     No current facility-administered medications for this visit.     Objective: BP 128/82 (BP Location: Left Arm, Patient Position: Sitting, Cuff Size: Normal)   Pulse 89   Temp 98.4 F (36.9 C) (Oral)   Wt 168 lb 3.2 oz (76.3 kg)   SpO2 94%   BMI 32.31 kg/m  Gen: NAD, resting comfortably CV: RRR no murmurs rubs or gallops Lungs: CTAB no crackles, wheeze, rhonchi  Ext: no edema Skin: warm, dry  Bilateral Shoulder though pain reported on left >R: Inspection reveals no abnormalities, atrophy or asymmetry. Palpation is normal with no tenderness over AC joint or bicipital groove. ROM is full in all planes. Rotator cuff strength normal throughout. Positive Neer and Hawkin's tests, empty can. Hawkin's test most painful No drop arm sign but patient does have pain particularly with bringing arm down  Assessment/Plan:  Acute pain of left shoulder - Plan: methylPREDNISolone acetate (DEPO-MEDROL) injection 80 mg S:History bursitiis bilateral shoulders. Injection in both 3 years ago- not sure where. Long term relief until 2 weeks ago then worsening symptoms at that time. No fall or injury. Started with reaching up into cabinets. Pain with movement gets up to 6/10. At rest no pain. Worse with taking arm back down. History partial tear rotator cuff on right side. Left handed. Pain up to 6/10 with activity A/P: 65 year old with shoulder pain of 2 weeks duration bilaterally. History of bursitis that responded well to  steroid shot. We will trial injection into left shoulder only since t is worse of the two. Suspect bursitis as cause but may be some rotator cuff dysfunction. Follow up in 4 months for injection in right if pain controlled and sugars not too poorly controlled. If this treatment does not help- refer to sports medicine- will reach out by mychart.    Diabetes mellitus type II, controlled, with no complications ([place]) S: reasonably controlled. On metformin 500mg  Lab Results  Component Value Date   HGBA1C 6.9 (H) 08/17/2016   HGBA1C 7.1 (H) 02/28/2016   HGBA1C 6.8 (H) 08/09/2015   A/P: continue current medications discussed need for weight loss with weight up 6 lbs to avoid additional medication need in future. Discussed risk of steroid shots for her blood sugars  Hyperlipemia S: poorly controlled on atorvastatin 20mg . No myalgias.  Lab Results  Component Value Date   CHOL 199 08/17/2016   HDL 48.90 08/17/2016   LDLCALC 117 (H) 08/17/2016   LDLDIRECT 140.0 08/09/2015   TRIG 165.0 (H) 08/17/2016   CHOLHDL 4 08/17/2016   A/P: with LFT elevations in past, we have opted to push for further weight loss to see if we can get LDL to goal <70. Encouraged patient on needed changes.    Hypertension associated with diabetes S: controlled on Valsartan 80-hctz 12.5 daily BP Readings from Last 3 Encounters:  08/29/16 128/82  02/28/16 114/80  10/21/15 132/80  A/P:Continue current meds:  Doing well  see avs instructions. 4 months DM. Sooner depending on shoulder  Meds ordered this encounter  Medications  . methylPREDNISolone acetate (DEPO-MEDROL) injection 80 mg    Return precautions advised.  [name], MD

## 2016-08-30 ENCOUNTER — Encounter: Payer: Self-pay | Admitting: Family Medicine

## 2016-09-15 ENCOUNTER — Other Ambulatory Visit: Payer: Self-pay

## 2016-09-15 MED ORDER — METFORMIN HCL 500 MG PO TABS: 500.0000 mg | ORAL_TABLET | Freq: Every day | ORAL | 3 refills | Status: DC

## 2016-10-20 LAB — HM DIABETES EYE EXAM

## 2016-10-23 ENCOUNTER — Encounter: Payer: Self-pay | Admitting: Family Medicine

## 2016-12-08 ENCOUNTER — Encounter (HOSPITAL_BASED_OUTPATIENT_CLINIC_OR_DEPARTMENT_OTHER): Payer: Self-pay | Admitting: *Deleted

## 2016-12-08 NOTE — H&P (Signed)
Candice Guerrero is an 66 y.o. female.   Chief Complaint: growth in upper jaw HPI: unknown duration  Past Medical History:  Diagnosis Date  . Allergy    SEASONAL  . Benign hematuria   . Cataract    LEFT EYE  . Dental crowns present   . Depression   . Diabetes mellitus    NIDDM  . GERD (gastroesophageal reflux disease)   . Headache(784.0)    migraines, sinus  . Hiatal hernia   . Hyperlipidemia   . Hypertension    under control, has been on med. since 2005  . Osteopenia   . Psoriasis    arms, legs, back  . Snores    denies sx. of sleep apnea    Past Surgical History:  Procedure Laterality Date  . COLONOSCOPY    . DILATION AND CURETTAGE OF UTERUS    . HARDWARE REMOVAL  07/11/2012   Procedure: HARDWARE REMOVAL;  Surgeon: Cammie Sickle., MD;  Location: [facility];  Service: Orthopedics;  Laterality: Left;  pin removal wire out and debride pisiform  . left wrist surgery     July 9th 2013  . TONSILLECTOMY    . TRIGGER FINGER RELEASE  07/11/2012   Procedure: RELEASE TRIGGER FINGER/A-1 PULLEY;  Surgeon: Cammie Sickle., MD;  Location: [facility];  Service: Orthopedics;  Laterality: Left;  LEFT LONG FINGER    Family History  Problem Relation Age of Onset  . Hyperlipidemia Mother   . Hypertension Mother   . Hypertension Father   . Kidney disease Father   . Anesthesia problems Sister     post-op N/V, hard to wake up post-op  . Diabetes Maternal Uncle    Social History:  reports that she quit smoking about 18 years ago. Her smoking use included Cigarettes. She has a 2.00 pack-year smoking history. She has never used smokeless tobacco. She reports that she drinks alcohol. She reports that she does not use drugs.  Allergies:  Allergies  Allergen Reactions  . Oxycodone Nausea Only  . Altace [Ramipril] Cough    No prescriptions prior to admission.    No results found for this or any previous visit (from the past 48 hour(s)). No results  found.  Review of Systems  Constitutional: Negative.   HENT: Negative.   Eyes: Negative.   Respiratory: Negative.   Cardiovascular: Negative.   Gastrointestinal: Negative.   Genitourinary: Negative.   Musculoskeletal: Negative.   Skin: Negative.   Neurological: Negative.   Endo/Heme/Allergies: Negative.   Psychiatric/Behavioral: Negative.     Height 5\' 1"  (1.549 m), weight 74.8 kg (165 lb). Physical Exam  HENT:  Head: Normocephalic and atraumatic.  Mouth/Throat: Uvula is midline, oropharynx is clear and moist and mucous membranes are normal.       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

## 2016-12-08 NOTE — H&P (Signed)
This is a 67 y/o wd/wn w female who presents with a large cystic lesion in the right maxilla.  The maxillary impacted third molar has been displaced anteriorly.  A CT scan shows a well demarcated lesion with in the body of the right maxillary sinus. The treatment plan is an excisonal biopsy with a frozen section to  Further treatment plan the procedure.  We will be prepared to definitively treat the tumor at this time.

## 2016-12-11 ENCOUNTER — Encounter (HOSPITAL_BASED_OUTPATIENT_CLINIC_OR_DEPARTMENT_OTHER)
Admission: RE | Admit: 2016-12-11 | Discharge: 2016-12-11 | Disposition: A | Discharge status: Home / Self Care | Payer: BLUE CROSS/BLUE SHIELD | Source: Ambulatory Visit | Attending: Oral Surgery | Admitting: Oral Surgery

## 2016-12-11 DIAGNOSIS — Z87891 Personal history of nicotine dependence: Secondary | ICD-10-CM | POA: Diagnosis not present

## 2016-12-11 DIAGNOSIS — I10 Essential (primary) hypertension: Secondary | ICD-10-CM

## 2016-12-11 DIAGNOSIS — K219 Gastro-esophageal reflux disease without esophagitis: Secondary | ICD-10-CM | POA: Diagnosis not present

## 2016-12-11 DIAGNOSIS — F329 Major depressive disorder, single episode, unspecified: Secondary | ICD-10-CM | POA: Diagnosis not present

## 2016-12-11 DIAGNOSIS — Z7984 Long term (current) use of oral hypoglycemic drugs: Secondary | ICD-10-CM | POA: Diagnosis not present

## 2016-12-11 DIAGNOSIS — E119 Type 2 diabetes mellitus without complications: Secondary | ICD-10-CM

## 2016-12-11 DIAGNOSIS — M278 Other specified diseases of jaws: Secondary | ICD-10-CM | POA: Diagnosis not present

## 2016-12-11 DIAGNOSIS — Z79899 Other long term (current) drug therapy: Secondary | ICD-10-CM | POA: Diagnosis not present

## 2016-12-11 DIAGNOSIS — Z6831 Body mass index (BMI) 31.0-31.9, adult: Secondary | ICD-10-CM | POA: Diagnosis not present

## 2016-12-11 DIAGNOSIS — E785 Hyperlipidemia, unspecified: Secondary | ICD-10-CM | POA: Diagnosis not present

## 2016-12-11 DIAGNOSIS — Z7982 Long term (current) use of aspirin: Secondary | ICD-10-CM | POA: Diagnosis not present

## 2016-12-11 DIAGNOSIS — K011 Impacted teeth: Secondary | ICD-10-CM | POA: Diagnosis not present

## 2016-12-11 LAB — BASIC METABOLIC PANEL
Anion gap: 10 (ref 5–15)
BUN: 12 mg/dL (ref 6–20)
CHLORIDE: 98 mmol/L — AB (ref 101–111)
CO2: 31 mmol/L (ref 22–32)
CREATININE: 0.72 mg/dL (ref 0.44–1.00)
Calcium: 9.3 mg/dL (ref 8.9–10.3)
GFR calc non Af Amer: >60 mL/min (ref >60)
GLUCOSE: 146 mg/dL — AB (ref 65–99)
Potassium: 3.4 mmol/L — ABNORMAL LOW (ref 3.5–5.1)
Sodium: 139 mmol/L (ref 135–145)

## 2016-12-11 NOTE — Progress Notes (Addendum)
EKG reviewed by Dr. Aris Lot, will proceed with surgery as scheduled.    Dr. Aris Lot reviewed labs/ no change to plan.

## 2016-12-12 NOTE — Anesthesia Preprocedure Evaluation (Signed)
Anesthesia Evaluation  Patient identified by MRN, date of birth, ID band Patient awake    Reviewed: Allergy & Precautions, H&P , NPO status , Patient's Chart, lab work & pertinent test results  History of Anesthesia Complications Negative for: history of anesthetic complications  Airway Mallampati: II  TM Distance: >3 FB Neck ROM: Full    Dental no notable dental hx. (+) Teeth Intact, Dental Advisory Given   Pulmonary Recent URI , Resolved, former smoker,    Pulmonary exam normal breath sounds clear to auscultation       Cardiovascular hypertension, Pt. on medications Normal cardiovascular exam Rhythm:Regular Rate:Normal     Neuro/Psych neg Headaches, PSYCHIATRIC DISORDERS Depression negative neurological ROS     GI/Hepatic Neg liver ROS, hiatal hernia, GERD  Medicated and Controlled,  Endo/Other  diabetes, Well Controlled, Type 2, Oral Hypoglycemic AgentsMorbid obesity  Renal/GU negative Renal ROS     Musculoskeletal   Abdominal (+) + obese,   Peds  Hematology   Anesthesia Other Findings   Reproductive/Obstetrics                             Anesthesia Physical  Anesthesia Plan  ASA: III  Anesthesia Plan: General   Post-op Pain Management:    Induction: Intravenous  Airway Management Planned: Nasal ETT  Additional Equipment:   Intra-op Plan:   Post-operative Plan:   Informed Consent: I have reviewed the patients History and Physical, chart, labs and discussed the procedure including the risks, benefits and alternatives for the proposed anesthesia with the patient or authorized representative who has indicated his/her understanding and acceptance.   Dental advisory given  Plan Discussed with: CRNA  Anesthesia Plan Comments:         Anesthesia Quick Evaluation

## 2016-12-13 ENCOUNTER — Encounter (HOSPITAL_BASED_OUTPATIENT_CLINIC_OR_DEPARTMENT_OTHER)
Admission: RE | Disposition: A | Discharge status: Home / Self Care | Payer: Self-pay | Source: Ambulatory Visit | Attending: Oral Surgery

## 2016-12-13 ENCOUNTER — Ambulatory Visit (HOSPITAL_BASED_OUTPATIENT_CLINIC_OR_DEPARTMENT_OTHER): Payer: BLUE CROSS/BLUE SHIELD | Admitting: Anesthesiology

## 2016-12-13 ENCOUNTER — Ambulatory Visit (HOSPITAL_BASED_OUTPATIENT_CLINIC_OR_DEPARTMENT_OTHER)
Admission: RE | Admit: 2016-12-13 | Discharge: 2016-12-13 | Disposition: A | Discharge status: Home / Self Care | Payer: BLUE CROSS/BLUE SHIELD | Source: Ambulatory Visit | Attending: Oral Surgery | Admitting: Oral Surgery

## 2016-12-13 ENCOUNTER — Encounter (HOSPITAL_BASED_OUTPATIENT_CLINIC_OR_DEPARTMENT_OTHER): Payer: Self-pay | Admitting: *Deleted

## 2016-12-13 DIAGNOSIS — K219 Gastro-esophageal reflux disease without esophagitis: Secondary | ICD-10-CM | POA: Diagnosis not present

## 2016-12-13 DIAGNOSIS — E119 Type 2 diabetes mellitus without complications: Secondary | ICD-10-CM | POA: Diagnosis not present

## 2016-12-13 DIAGNOSIS — F329 Major depressive disorder, single episode, unspecified: Secondary | ICD-10-CM | POA: Insufficient documentation

## 2016-12-13 DIAGNOSIS — K011 Impacted teeth: Secondary | ICD-10-CM | POA: Diagnosis not present

## 2016-12-13 DIAGNOSIS — I10 Essential (primary) hypertension: Secondary | ICD-10-CM | POA: Diagnosis not present

## 2016-12-13 DIAGNOSIS — E785 Hyperlipidemia, unspecified: Secondary | ICD-10-CM | POA: Diagnosis not present

## 2016-12-13 DIAGNOSIS — Z79899 Other long term (current) drug therapy: Secondary | ICD-10-CM | POA: Insufficient documentation

## 2016-12-13 DIAGNOSIS — F418 Other specified anxiety disorders: Secondary | ICD-10-CM | POA: Diagnosis not present

## 2016-12-13 DIAGNOSIS — M278 Other specified diseases of jaws: Secondary | ICD-10-CM | POA: Insufficient documentation

## 2016-12-13 DIAGNOSIS — Z6831 Body mass index (BMI) 31.0-31.9, adult: Secondary | ICD-10-CM | POA: Insufficient documentation

## 2016-12-13 DIAGNOSIS — Z7984 Long term (current) use of oral hypoglycemic drugs: Secondary | ICD-10-CM | POA: Insufficient documentation

## 2016-12-13 DIAGNOSIS — D492 Neoplasm of unspecified behavior of bone, soft tissue, and skin: Secondary | ICD-10-CM | POA: Diagnosis not present

## 2016-12-13 DIAGNOSIS — Z87891 Personal history of nicotine dependence: Secondary | ICD-10-CM | POA: Diagnosis not present

## 2016-12-13 DIAGNOSIS — K090 Developmental odontogenic cysts: Secondary | ICD-10-CM | POA: Diagnosis not present

## 2016-12-13 DIAGNOSIS — Z7982 Long term (current) use of aspirin: Secondary | ICD-10-CM | POA: Insufficient documentation

## 2016-12-13 HISTORY — PX: TOOTH EXTRACTION: SHX859

## 2016-12-13 LAB — GLUCOSE, CAPILLARY
Glucose-Capillary: 127 mg/dL — ABNORMAL HIGH (ref 65–99)
Glucose-Capillary: 153 mg/dL — ABNORMAL HIGH (ref 65–99)

## 2016-12-13 SURGERY — EXTRACTION, TOOTH, MOLAR
Anesthesia: General | Site: Mouth | Laterality: Right

## 2016-12-13 MED ORDER — BUPIVACAINE-EPINEPHRINE (PF) 0.5% -1:200000 IJ SOLN
INTRAMUSCULAR | Status: AC
  Filled 2016-12-13: qty 3.6

## 2016-12-13 MED ORDER — LACTATED RINGERS IV SOLN
INTRAVENOUS | Status: DC
  Administered 2016-12-13: 08:00:00 via INTRAVENOUS

## 2016-12-13 MED ORDER — HYDROCODONE-ACETAMINOPHEN 5-325 MG PO TABS
1.0000 | ORAL_TABLET | Freq: Once | ORAL | Status: AC
  Administered 2016-12-13: 1 via ORAL

## 2016-12-13 MED ORDER — EPHEDRINE 5 MG/ML INJ
INTRAVENOUS | Status: AC
  Filled 2016-12-13: qty 10

## 2016-12-13 MED ORDER — BUPIVACAINE-EPINEPHRINE (PF) 0.5% -1:200000 IJ SOLN
INTRAMUSCULAR | Status: AC
  Filled 2016-12-13: qty 30

## 2016-12-13 MED ORDER — CHLORHEXIDINE GLUCONATE CLOTH 2 % EX PADS: 6.0000 | MEDICATED_PAD | Freq: Once | CUTANEOUS | Status: DC

## 2016-12-13 MED ORDER — FENTANYL CITRATE (PF) 100 MCG/2ML IJ SOLN
50.0000 ug | INTRAMUSCULAR | Status: DC | PRN
  Administered 2016-12-13: 100 ug via INTRAVENOUS

## 2016-12-13 MED ORDER — ONDANSETRON HCL 4 MG/2ML IJ SOLN
INTRAMUSCULAR | Status: DC | PRN
  Administered 2016-12-13: 4 mg via INTRAVENOUS

## 2016-12-13 MED ORDER — LIDOCAINE-EPINEPHRINE 2 %-1:100000 IJ SOLN
INTRAMUSCULAR | Status: DC | PRN
  Administered 2016-12-13: 1.7 mL via INTRADERMAL

## 2016-12-13 MED ORDER — MIDAZOLAM HCL 2 MG/2ML IJ SOLN
1.0000 mg | INTRAMUSCULAR | Status: DC | PRN
  Administered 2016-12-13: 2 mg via INTRAVENOUS

## 2016-12-13 MED ORDER — MIDAZOLAM HCL 2 MG/2ML IJ SOLN
INTRAMUSCULAR | Status: AC
  Filled 2016-12-13: qty 2

## 2016-12-13 MED ORDER — MEPERIDINE HCL 25 MG/ML IJ SOLN: 6.2500 mg | INTRAMUSCULAR | Status: DC | PRN

## 2016-12-13 MED ORDER — ONDANSETRON HCL 4 MG/2ML IJ SOLN
INTRAMUSCULAR | Status: AC
  Filled 2016-12-13: qty 2

## 2016-12-13 MED ORDER — LIDOCAINE-EPINEPHRINE 2 %-1:100000 IJ SOLN
INTRAMUSCULAR | Status: AC
  Filled 2016-12-13: qty 3.4

## 2016-12-13 MED ORDER — CEFAZOLIN SODIUM-DEXTROSE 2-4 GM/100ML-% IV SOLN
2.0000 g | INTRAVENOUS | Status: AC
  Administered 2016-12-13: 2 g via INTRAVENOUS

## 2016-12-13 MED ORDER — BACITRACIN ZINC 500 UNIT/GM EX OINT
TOPICAL_OINTMENT | CUTANEOUS | Status: AC
  Filled 2016-12-13: qty 28.35

## 2016-12-13 MED ORDER — PROMETHAZINE HCL 25 MG/ML IJ SOLN: 6.2500 mg | INTRAMUSCULAR | Status: DC | PRN

## 2016-12-13 MED ORDER — TRAMADOL HCL 50 MG PO TABS: 50.0000 mg | ORAL_TABLET | Freq: Four times a day (QID) | ORAL | 0 refills | Status: DC | PRN

## 2016-12-13 MED ORDER — FENTANYL CITRATE (PF) 100 MCG/2ML IJ SOLN
INTRAMUSCULAR | Status: AC
  Filled 2016-12-13: qty 2

## 2016-12-13 MED ORDER — SUCCINYLCHOLINE CHLORIDE 200 MG/10ML IV SOSY
PREFILLED_SYRINGE | INTRAVENOUS | Status: AC
  Filled 2016-12-13: qty 10

## 2016-12-13 MED ORDER — DEXAMETHASONE SODIUM PHOSPHATE 10 MG/ML IJ SOLN
INTRAMUSCULAR | Status: AC
  Filled 2016-12-13: qty 1

## 2016-12-13 MED ORDER — HYDROMORPHONE HCL 1 MG/ML IJ SOLN
0.2500 mg | INTRAMUSCULAR | Status: DC | PRN
  Administered 2016-12-13: 0.5 mg via INTRAVENOUS

## 2016-12-13 MED ORDER — HYDROCODONE-ACETAMINOPHEN 7.5-325 MG PO TABS: 1.0000 | ORAL_TABLET | Freq: Once | ORAL | Status: DC | PRN

## 2016-12-13 MED ORDER — EPHEDRINE SULFATE-NACL 50-0.9 MG/10ML-% IV SOSY
PREFILLED_SYRINGE | INTRAVENOUS | Status: DC | PRN
  Administered 2016-12-13: 10 mg via INTRAVENOUS
  Administered 2016-12-13: 15 mg via INTRAVENOUS

## 2016-12-13 MED ORDER — HYDROMORPHONE HCL 1 MG/ML IJ SOLN
INTRAMUSCULAR | Status: AC
  Filled 2016-12-13: qty 1

## 2016-12-13 MED ORDER — SCOPOLAMINE 1 MG/3DAYS TD PT72: 1.0000 | MEDICATED_PATCH | Freq: Once | TRANSDERMAL | Status: DC | PRN

## 2016-12-13 MED ORDER — SUCCINYLCHOLINE CHLORIDE 20 MG/ML IJ SOLN
INTRAMUSCULAR | Status: DC | PRN
  Administered 2016-12-13: 50 mg via INTRAVENOUS

## 2016-12-13 MED ORDER — DEXAMETHASONE SODIUM PHOSPHATE 4 MG/ML IJ SOLN
INTRAMUSCULAR | Status: DC | PRN
  Administered 2016-12-13: 10 mg via INTRAVENOUS

## 2016-12-13 MED ORDER — HYDROCODONE-ACETAMINOPHEN 5-325 MG PO TABS
ORAL_TABLET | ORAL | Status: AC
  Filled 2016-12-13: qty 1

## 2016-12-13 MED ORDER — PROPOFOL 10 MG/ML IV BOLUS
INTRAVENOUS | Status: DC | PRN
  Administered 2016-12-13: 150 mg via INTRAVENOUS

## 2016-12-13 MED ORDER — CEPHALEXIN 500 MG PO CAPS: 500.0000 mg | ORAL_CAPSULE | Freq: Four times a day (QID) | ORAL | 0 refills | Status: DC

## 2016-12-13 MED ORDER — LIDOCAINE HCL (CARDIAC) 20 MG/ML IV SOLN
INTRAVENOUS | Status: DC | PRN
  Administered 2016-12-13: 100 mg via INTRAVENOUS

## 2016-12-13 MED ORDER — CEFAZOLIN SODIUM-DEXTROSE 2-4 GM/100ML-% IV SOLN
INTRAVENOUS | Status: AC
  Filled 2016-12-13: qty 100

## 2016-12-13 SURGICAL SUPPLY — 31 items
BLADE SURG 15 STRL LF DISP TIS (BLADE) ×1 IMPLANT
BLADE SURG 15 STRL SS (BLADE) ×1
BUR OVAL 4.0X59 (BURR) IMPLANT
CANISTER SUCT 1200ML W/VALVE (MISCELLANEOUS) ×2 IMPLANT
CATH ROBINSON RED A/P 10FR (CATHETERS) IMPLANT
COVER BACK TABLE 60X90IN (DRAPES) ×2 IMPLANT
COVER MAYO STAND STRL (DRAPES) ×2 IMPLANT
DRAPE U-SHAPE 76X120 STRL (DRAPES) ×2 IMPLANT
GAUZE PACKING IODOFORM 1/4X15 (GAUZE/BANDAGES/DRESSINGS) IMPLANT
GLOVE BIO SURGEON STRL SZ 6.5 (GLOVE) ×4 IMPLANT
GLOVE BIO SURGEON STRL SZ7.5 (GLOVE) ×2 IMPLANT
GOWN STRL REUS W/ TWL LRG LVL3 (GOWN DISPOSABLE) ×2 IMPLANT
GOWN STRL REUS W/ TWL XL LVL3 (GOWN DISPOSABLE) ×1 IMPLANT
GOWN STRL REUS W/TWL LRG LVL3 (GOWN DISPOSABLE) ×2
GOWN STRL REUS W/TWL XL LVL3 (GOWN DISPOSABLE) ×1
IV NS 500ML (IV SOLUTION) ×1
IV NS 500ML BAXH (IV SOLUTION) ×1 IMPLANT
NEEDLE DENTAL 27 LONG (NEEDLE) ×2 IMPLANT
NS IRRIG 1000ML POUR BTL (IV SOLUTION) ×2 IMPLANT
PACK BASIN DAY SURGERY FS (CUSTOM PROCEDURE TRAY) ×2 IMPLANT
SPONGE SURGIFOAM ABS GEL 12-7 (HEMOSTASIS) IMPLANT
SUT CHROMIC 3 0 PS 2 (SUTURE) IMPLANT
SUT CHROMIC 4 0 P 3 18 (SUTURE) IMPLANT
SUT SILK 3 0 PS 1 (SUTURE) IMPLANT
SYR 50ML LL SCALE MARK (SYRINGE) ×4 IMPLANT
TOOTHBRUSH ADULT (PERSONAL CARE ITEMS) IMPLANT
TOWEL OR 17X24 6PK STRL BLUE (TOWEL DISPOSABLE) ×4 IMPLANT
TOWEL OR NON WOVEN STRL DISP B (DISPOSABLE) ×2 IMPLANT
TUBE CONNECTING 20X1/4 (TUBING) ×2 IMPLANT
VENT IRR SPI W TUB AD (MISCELLANEOUS) ×2 IMPLANT
YANKAUER SUCT BULB TIP NO VENT (SUCTIONS) ×2 IMPLANT

## 2016-12-13 NOTE — Op Note (Signed)
The patient was brought to the operating room and placed in supine position and which remained throughout the whole procedure. She was intubated by right nasoendotracheal tube. She was then prepped and draped in usual fashion for an intraoral procedure. A timeout was performed. The mouth and throat had been suctioned out and Betadine scrub and Betadine applied to the oral cavity prior to the start of the procedure. One carpule of 2% Xylocaine with 1-100,000 epinephrine was given in the mucobuccal fold and palate. A child-size bite block was placed on the patient's left side. A #15 blade made an incision over the right tuberosity extending to #4. A releasing incision was created between #3 and #4. A full-thickness buccal flap was elevated with a periosteal elevator. The bone covering #1 and the area in question was further opened up with a round bur and copious irrigation. The tooth was encountered. It was sectioned with a round bur and split with an 11-A elevator. The 2 parts of the tooth with an elevated out of the socket and removed with a rongeur. Soft tissue within the socket appeared broken down and there was a hematoma in place. The remanence of the soft tissue was submitted for pathological examination. A frozen section showed respiratory epithelium. The cyst apparently had completely involuted. The area was curetted and irrigated. The soft tissue was repositioned and closed primarily with multiple 3-0 chromic sutures. The mouth was suctioned out the throat pack was removed. The patient tolerated the procedure well and was extubated on the table. She'll be sent home and followed by me in my private office.

## 2016-12-13 NOTE — H&P (Signed)
History updated.  Surgery was reviewed.  Possible loss of other teeth discussed.

## 2016-12-13 NOTE — Transfer of Care (Signed)
Immediate Anesthesia Transfer of Care Note  Patient: Candice Guerrero  Procedure(s) Performed: Procedure(s): SURGICAL REMOVAL OF ODONTOGENIC TUMOR AND REMOVAL OF IMPACTED TOOTH #1 (Right)  Patient Location: PACU  Anesthesia Type:General  Level of Consciousness: awake and alert   Airway & Oxygen Therapy: Patient Spontanous Breathing and Patient connected to face mask oxygen  Post-op Assessment: Report given to RN and Post -op Vital signs reviewed and stable  Post vital signs: Reviewed and stable  Last Vitals:  Vitals:   12/13/16 0738  BP: 139/70  Pulse: 67  Resp: 18  Temp: 37.1 C    Last Pain:  Vitals:   12/13/16 0738  TempSrc: Oral         Complications: No apparent anesthesia complications

## 2016-12-13 NOTE — Anesthesia Postprocedure Evaluation (Signed)
Anesthesia Post Note  Patient: Candice Guerrero  Procedure(s) Performed: Procedure(s) (LRB): SURGICAL REMOVAL OF ODONTOGENIC TUMOR AND REMOVAL OF IMPACTED TOOTH #1 (Right)  Patient location during evaluation: PACU Anesthesia Type: General Level of consciousness: sedated and patient cooperative Pain management: pain level controlled Vital Signs Assessment: post-procedure vital signs reviewed and stable Respiratory status: spontaneous breathing Cardiovascular status: stable Anesthetic complications: no       Last Vitals:  Vitals:   12/13/16 1000 12/13/16 1105  BP: (!) 141/78 140/78  Pulse: 90 94  Resp: 15 16  Temp:  36.6 C    Last Pain:  Vitals:   12/13/16 1105  TempSrc: Oral  PainSc: [place]

## 2016-12-13 NOTE — Brief Op Note (Signed)
12/13/2016  9:30 AM  PATIENT:  Candice Guerrero  66 y.o. female  PRE-OPERATIVE DIAGNOSIS:  odontogenic tumor of right mandible  POST-OPERATIVE DIAGNOSIS:  odontogenic tumor of right mandible  PROCEDURE:  Procedure(s): SURGICAL REMOVAL OF ODONTOGENIC TUMOR AND REMOVAL OF IMPACTED TOOTH #1 (Right)  SURGEON:  Surgeon(s) and Role:    * Jannette Fogo, DDS - Primary  PHYSICIAN ASSISTANT:   ASSISTANTS: Jilda Panda, Kathaleen Grinder   ANESTHESIA:   general  EBL:  Total I/O In: -  Out: 10 [Blood:10]  BLOOD ADMINISTERED:none  DRAINS: none   LOCAL MEDICATIONS USED:  XYLOCAINE   SPECIMEN:  Source of Specimen:  Right maxilla  DISPOSITION OF SPECIMEN:  PATHOLOGY  COUNTS:  YES  TOURNIQUET:  * No tourniquets in log *  DICTATION: .Dragon Dictation  PLAN OF CARE: Discharge to home after PACU  PATIENT DISPOSITION:  PACU - hemodynamically stable.   Delay start of Pharmacological VTE agent (>24hrs) due to surgical blood loss or risk of bleeding: no

## 2016-12-13 NOTE — Anesthesia Procedure Notes (Signed)
Procedure Name: Intubation Performed by: Tawni Millers Pre-anesthesia Checklist: Patient identified, Emergency Drugs available, Suction available and Patient being monitored Patient Re-evaluated:Patient Re-evaluated prior to inductionOxygen Delivery Method: Circle system utilized Preoxygenation: Pre-oxygenation with 100% oxygen Intubation Type: IV induction Ventilation: Mask ventilation without difficulty Laryngoscope Size: Mac and 3 Grade View: Grade I Nasal Tubes: Nasal prep performed, Nasal Rae and Right Tube size: 6.5 mm Number of attempts: 1 Placement Confirmation: ETT inserted through vocal cords under direct vision,  positive ETCO2 and breath sounds checked- equal and bilateral Tube secured with: Tape Dental Injury: Teeth and Oropharynx as per pre-operative assessment

## 2016-12-13 NOTE — Discharge Instructions (Signed)

## 2016-12-14 ENCOUNTER — Encounter (HOSPITAL_BASED_OUTPATIENT_CLINIC_OR_DEPARTMENT_OTHER): Payer: Self-pay | Admitting: Oral Surgery

## 2016-12-27 ENCOUNTER — Ambulatory Visit: Payer: BLUE CROSS/BLUE SHIELD | Admitting: Family Medicine

## 2017-02-16 ENCOUNTER — Telehealth: Payer: Self-pay | Admitting: Family Medicine

## 2017-02-27 NOTE — Telephone Encounter (Signed)
Called to see if pt wanted to schedule awv - left message ( coming in 5/30 - come in at 9? )

## 2017-03-14 ENCOUNTER — Encounter: Payer: Self-pay | Admitting: Family Medicine

## 2017-03-14 ENCOUNTER — Ambulatory Visit (INDEPENDENT_AMBULATORY_CARE_PROVIDER_SITE_OTHER): Payer: BLUE CROSS/BLUE SHIELD | Admitting: Family Medicine

## 2017-03-14 VITALS — BP 138/88 | HR 87 | Temp 98.7°F | Ht 61.0 in | Wt 166.2 lb

## 2017-03-14 DIAGNOSIS — I10 Essential (primary) hypertension: Secondary | ICD-10-CM

## 2017-03-14 DIAGNOSIS — E119 Type 2 diabetes mellitus without complications: Secondary | ICD-10-CM

## 2017-03-14 DIAGNOSIS — Z23 Encounter for immunization: Secondary | ICD-10-CM

## 2017-03-14 DIAGNOSIS — E1159 Type 2 diabetes mellitus with other circulatory complications: Secondary | ICD-10-CM | POA: Diagnosis not present

## 2017-03-14 DIAGNOSIS — L409 Psoriasis, unspecified: Secondary | ICD-10-CM | POA: Diagnosis not present

## 2017-03-14 DIAGNOSIS — E785 Hyperlipidemia, unspecified: Secondary | ICD-10-CM | POA: Diagnosis not present

## 2017-03-14 DIAGNOSIS — F418 Other specified anxiety disorders: Secondary | ICD-10-CM

## 2017-03-14 DIAGNOSIS — I152 Hypertension secondary to endocrine disorders: Secondary | ICD-10-CM

## 2017-03-14 LAB — COMPREHENSIVE METABOLIC PANEL
ALBUMIN: 4.5 g/dL (ref 3.5–5.2)
ALK PHOS: 115 U/L (ref 39–117)
ALT: 35 U/L (ref 0–35)
AST: 35 U/L (ref 0–37)
BILIRUBIN TOTAL: 0.5 mg/dL (ref 0.2–1.2)
BUN: 12 mg/dL (ref 6–23)
CO2: 35 mEq/L — ABNORMAL HIGH (ref 19–32)
Calcium: 9.9 mg/dL (ref 8.4–10.5)
Chloride: 101 mEq/L (ref 96–112)
Creatinine, Ser: 0.7 mg/dL (ref 0.40–1.20)
GFR: 88.43 mL/min (ref >60.00)
GLUCOSE: 94 mg/dL (ref 70–99)
Potassium: 4.4 mEq/L (ref 3.5–5.1)
Sodium: 140 mEq/L (ref 135–145)
TOTAL PROTEIN: 7.3 g/dL (ref 6.0–8.3)

## 2017-03-14 LAB — HEMOGLOBIN A1C: Hgb A1c MFr Bld: 7.3 % — ABNORMAL HIGH (ref 4.6–6.5)

## 2017-03-14 LAB — LDL CHOLESTEROL, DIRECT: Direct LDL: 139 mg/dL

## 2017-03-14 MED ORDER — VALSARTAN-HYDROCHLOROTHIAZIDE 80-12.5 MG PO TABS: ORAL_TABLET | ORAL | 3 refills | Status: DC

## 2017-03-14 MED ORDER — METFORMIN HCL 500 MG PO TABS
500.0000 mg | ORAL_TABLET | Freq: Every day | ORAL | 3 refills | Status: DC
Start: 2017-03-14 — End: 2017-07-30

## 2017-03-14 MED ORDER — CITALOPRAM HYDROBROMIDE 20 MG PO TABS: ORAL_TABLET | ORAL | 3 refills | Status: DC

## 2017-03-14 MED ORDER — OMEPRAZOLE 40 MG PO CPDR: DELAYED_RELEASE_CAPSULE | ORAL | 3 refills | Status: DC

## 2017-03-14 MED ORDER — ATORVASTATIN CALCIUM 20 MG PO TABS: ORAL_TABLET | ORAL | 3 refills | Status: DC

## 2017-03-14 NOTE — Assessment & Plan Note (Signed)
S: last LDL was 117 and above goal. We pushed for weight loss with prior LFT elevations instead of increase of statin atorvastatin 20mg . She has lost 2 lbs A/P: update LDL with increased plant based diet

## 2017-03-14 NOTE — Progress Notes (Addendum)
Subjective:  Candice Guerrero is a 66 y.o. year old very pleasant female patient who presents for/with See problem oriented charting ROS- No chest pain or shortness of breath. No headache or blurry vision. No hypoglycemia  Past Medical History-  Patient Active Problem List   Diagnosis Date Noted  . Diabetes mellitus type II, controlled, with no complications ([place]) 41/28/7867    Priority: High  . IBS (irritable bowel syndrome) 07/24/2014    Priority: Medium  . Psoriasis 12/13/2010    Priority: Medium  . Hypertension associated with diabetes ([place]) 12/13/2010    Priority: Medium  . Depression with anxiety 12/13/2010    Priority: Medium  . Fatty liver 12/13/2010    Priority: Medium  . Hyperlipemia 12/13/2010    Priority: Medium  . Osteopenia 07/24/2014    Priority: Low  . Allergic rhinitis 07/24/2014    Priority: Low  . Obesity (BMI 30-39.9) 12/31/2013    Priority: Low  . Benign hematuria 12/13/2010    Priority: Low  . GERD (gastroesophageal reflux disease) 12/13/2010    Priority: Low    Medications- reviewed and updated Current Outpatient Prescriptions  Medication Sig Dispense Refill  . aspirin 81 MG tablet Take 81 mg by mouth daily.      Marland Kitchen aspirin-acetaminophen-caffeine (EXCEDRIN MIGRAINE) 250-250-65 MG per tablet Take 1 tablet by mouth every 6 (six) hours as needed. Reported on 10/21/2015    . atorvastatin (LIPITOR) 20 MG tablet Take 1 tablet by mouth  daily 90 tablet 2  . calcipotriene-betamethasone (TACLONEX) ointment Apply topically daily. 60 g 0  . Calcium Carbonate (CALTRATE 600) 1500 MG TABS Take by mouth daily.      . citalopram (CELEXA) 20 MG tablet Take 1 tablet by mouth  daily 90 tablet 2  . cyclobenzaprine (FLEXERIL) 5 MG tablet Take 1 tablet (5 mg total) by mouth 3 (three) times daily as needed for muscle spasms. 30 tablet 1  . hyoscyamine (LEVSIN, ANASPAZ) 0.125 MG tablet Take 1 tablet (0.125 mg total) by mouth every 4 (four) hours as needed. 30 tablet 1  . metFORMIN  (GLUCOPHAGE) 500 MG tablet Take 1 tablet (500 mg total) by mouth daily with breakfast. 90 tablet 3  . mometasone (NASONEX) 50 MCG/ACT nasal spray Place 2 sprays into the nose daily. 17 g 11  . omeprazole (PRILOSEC) 40 MG capsule Take 1 capsule by mouth  daily 90 capsule 2  . Probiotic Product (ALIGN) 4 MG CAPS Take by mouth.    . traMADol (ULTRAM) 50 MG tablet Take 1 tablet (50 mg total) by mouth every 6 (six) hours as needed. 30 tablet 0  . valsartan-hydrochlorothiazide (DIOVAN-HCT) 80-12.5 MG tablet Take 1 tablet by mouth  daily 90 tablet 2  . VITAMIN D, ERGOCALCIFEROL, PO Take by mouth daily.     No current facility-administered medications for this visit.     Objective: BP 138/88   Pulse 87   Temp 98.7 F (37.1 C) (Oral)   Ht 5\' 1"  (1.549 m)   Wt 166 lb 3.2 oz (75.4 kg)   SpO2 96%   BMI 31.40 kg/m  Gen: NAD, resting comfortably CV: RRR no murmurs rubs or gallops Lungs: CTAB no crackles, wheeze, rhonchi Abdomen: soft/nontender/nondistended/normal bowel sounds. No rebound or guarding. obese Ext: no edema Skin: warm, dry, several plaques with silvery scale on bilateral upper legs and low back Neuro: grossly normal, moves all extremities  Diabetic Foot Exam - Simple   Simple Foot Form Diabetic Foot exam was performed with the following findings:  Yes 03/14/2017 10:24 AM  Visual Inspection No deformities, no ulcerations, no other skin breakdown bilaterally:  Yes Sensation Testing Intact to touch and monofilament testing bilaterally:  Yes Pulse Check Posterior Tibialis and Dorsalis pulse intact bilaterally:  Yes Comments    Assessment/Plan:  Hypertension associated with diabetes S: controlled on valsartan 80-hctz 12.5mg .  ASCVD 10 year risk calculation if age 34-79: elevated but already on statin.  BP Readings from Last 3 Encounters:  03/14/17 138/90  12/13/16 140/78  08/29/16 128/82  A/P: We discussed blood pressure goal of <140/90. Continue current meds:  Discussed  closer to goal than desired so will work on more weight loss  Diabetes mellitus type II, controlled, with no complications ([place]) S: well controlled. On metformin 500mg  alone CBGs- does not check Exercise and diet-  30 minutes 4 days a week- wants to be closer 5 or 6. Been eating better- more salads, veggies, lean meats. Avoiding chips because she overdoes it.  Lab Results  Component Value Date   HGBA1C 6.9 (H) 08/17/2016   HGBA1C 7.1 (H) 02/28/2016   HGBA1C 6.8 (H) 08/09/2015   A/P: update a1c today  Hyperlipemia S: last LDL was 117 and above goal. We pushed for weight loss with prior LFT elevations instead of increase of statin atorvastatin 20mg . She has lost 2 lbs A/P: update LDL with increased plant based diet  Psoriasis Taclonex per dermatology. Doing some sun tanning per dermatology as well. Patient wants to discuss oral agent options- I advised she discuss with dermatology but I can do labs as needed.   4 months shingrix #1 today- repeat 4 months  Orders Placed This Encounter  Procedures  . LDL cholesterol, direct    [place]  . Hemoglobin A1c    [place]  . Comprehensive metabolic panel    [place]    Meds ordered this encounter  Medications  . omeprazole (PRILOSEC) 40 MG capsule    Sig: Take 1 capsule by mouth  daily    Dispense:  90 capsule    Refill:  3  . citalopram (CELEXA) 20 MG tablet    Sig: Take 1 tablet by mouth  daily    Dispense:  90 tablet    Refill:  3  . atorvastatin (LIPITOR) 20 MG tablet    Sig: Take 1 tablet by mouth  daily    Dispense:  90 tablet    Refill:  3  . metFORMIN (GLUCOPHAGE) 500 MG tablet    Sig: Take 1 tablet (500 mg total) by mouth daily with breakfast.    Dispense:  90 tablet    Refill:  3  . valsartan-hydrochlorothiazide (DIOVAN-HCT) 80-12.5 MG tablet    Sig: Take 1 tablet by mouth  daily    Dispense:  90 tablet    Refill:  3   Return precautions advised.  [name], MD

## 2017-03-14 NOTE — Patient Instructions (Addendum)
Shingrix #1 today. Repeat injection in 2-3 months. Schedule this before you leave.   Please stop by lab before you go

## 2017-03-14 NOTE — Assessment & Plan Note (Signed)
Taclonex per dermatology. Doing some sun tanning per dermatology as well. Patient wants to discuss oral agent options- I advised she discuss with dermatology but I can do labs as needed.

## 2017-03-14 NOTE — Addendum Note (Signed)
Addended by: Lucianne Lei M on: 03/14/2017 10:43 AM   Modules accepted: Orders

## 2017-03-14 NOTE — Assessment & Plan Note (Signed)
S: controlled on valsartan 80-hctz 12.5mg .  ASCVD 10 year risk calculation if age 68-79: elevated but already on statin.  BP Readings from Last 3 Encounters:  03/14/17 138/90  12/13/16 140/78  08/29/16 128/82  A/P: We discussed blood pressure goal of <140/90. Continue current meds:  Discussed closer to goal than desired so will work on more weight loss

## 2017-03-14 NOTE — Assessment & Plan Note (Signed)
S: well controlled. On metformin 500mg  alone CBGs- does not check Exercise and diet-  30 minutes 4 days a week- wants to be closer 5 or 6. Been eating better- more salads, veggies, lean meats. Avoiding chips because she overdoes it.  Lab Results  Component Value Date   HGBA1C 6.9 (H) 08/17/2016   HGBA1C 7.1 (H) 02/28/2016   HGBA1C 6.8 (H) 08/09/2015   A/P: update a1c today

## 2017-03-14 NOTE — Assessment & Plan Note (Signed)
PHQ2 of 0. No SI. Continue celexa.

## 2017-03-15 ENCOUNTER — Other Ambulatory Visit: Payer: Self-pay

## 2017-03-15 MED ORDER — ATORVASTATIN CALCIUM 40 MG PO TABS: 40.0000 mg | ORAL_TABLET | Freq: Every day | ORAL | 3 refills | Status: DC

## 2017-03-21 DIAGNOSIS — L821 Other seborrheic keratosis: Secondary | ICD-10-CM | POA: Diagnosis not present

## 2017-03-21 DIAGNOSIS — D225 Melanocytic nevi of trunk: Secondary | ICD-10-CM | POA: Diagnosis not present

## 2017-03-21 DIAGNOSIS — L918 Other hypertrophic disorders of the skin: Secondary | ICD-10-CM | POA: Diagnosis not present

## 2017-03-21 DIAGNOSIS — L400 Psoriasis vulgaris: Secondary | ICD-10-CM | POA: Diagnosis not present

## 2017-03-21 DIAGNOSIS — D2272 Melanocytic nevi of left lower limb, including hip: Secondary | ICD-10-CM | POA: Diagnosis not present

## 2017-03-21 DIAGNOSIS — L814 Other melanin hyperpigmentation: Secondary | ICD-10-CM | POA: Diagnosis not present

## 2017-03-21 DIAGNOSIS — D1801 Hemangioma of skin and subcutaneous tissue: Secondary | ICD-10-CM | POA: Diagnosis not present

## 2017-03-21 DIAGNOSIS — D235 Other benign neoplasm of skin of trunk: Secondary | ICD-10-CM | POA: Diagnosis not present

## 2017-03-21 DIAGNOSIS — D229 Melanocytic nevi, unspecified: Secondary | ICD-10-CM | POA: Diagnosis not present

## 2017-03-28 ENCOUNTER — Other Ambulatory Visit: Payer: Self-pay | Admitting: Family Medicine

## 2017-03-28 DIAGNOSIS — Z1231 Encounter for screening mammogram for malignant neoplasm of breast: Secondary | ICD-10-CM

## 2017-05-07 ENCOUNTER — Encounter: Payer: Self-pay | Admitting: Family Medicine

## 2017-05-21 ENCOUNTER — Ambulatory Visit (INDEPENDENT_AMBULATORY_CARE_PROVIDER_SITE_OTHER): Payer: BLUE CROSS/BLUE SHIELD | Admitting: *Deleted

## 2017-05-21 DIAGNOSIS — Z23 Encounter for immunization: Secondary | ICD-10-CM

## 2017-05-29 ENCOUNTER — Ambulatory Visit
Admission: RE | Admit: 2017-05-29 | Discharge: 2017-05-29 | Disposition: A | Discharge status: Home / Self Care | Payer: Medicare Other | Source: Ambulatory Visit | Attending: Family Medicine | Admitting: Family Medicine

## 2017-05-29 DIAGNOSIS — Z1231 Encounter for screening mammogram for malignant neoplasm of breast: Secondary | ICD-10-CM

## 2017-05-30 ENCOUNTER — Other Ambulatory Visit: Payer: Self-pay | Admitting: Family Medicine

## 2017-05-30 DIAGNOSIS — R928 Other abnormal and inconclusive findings on diagnostic imaging of breast: Secondary | ICD-10-CM

## 2017-06-01 ENCOUNTER — Other Ambulatory Visit: Payer: Self-pay | Admitting: Family Medicine

## 2017-06-01 ENCOUNTER — Ambulatory Visit
Admission: RE | Admit: 2017-06-01 | Discharge: 2017-06-01 | Disposition: A | Discharge status: Home / Self Care | Payer: Medicare Other | Source: Ambulatory Visit | Attending: Family Medicine | Admitting: Family Medicine

## 2017-06-01 DIAGNOSIS — N6310 Unspecified lump in the right breast, unspecified quadrant: Secondary | ICD-10-CM

## 2017-06-01 DIAGNOSIS — R928 Other abnormal and inconclusive findings on diagnostic imaging of breast: Secondary | ICD-10-CM

## 2017-06-01 DIAGNOSIS — N6489 Other specified disorders of breast: Secondary | ICD-10-CM | POA: Diagnosis not present

## 2017-06-07 ENCOUNTER — Ambulatory Visit
Admission: RE | Admit: 2017-06-07 | Discharge: 2017-06-07 | Disposition: A | Discharge status: Home / Self Care | Payer: Medicare Other | Source: Ambulatory Visit | Attending: Family Medicine | Admitting: Family Medicine

## 2017-06-07 ENCOUNTER — Other Ambulatory Visit: Payer: Self-pay | Admitting: Family Medicine

## 2017-06-07 DIAGNOSIS — R928 Other abnormal and inconclusive findings on diagnostic imaging of breast: Secondary | ICD-10-CM

## 2017-06-07 DIAGNOSIS — N6310 Unspecified lump in the right breast, unspecified quadrant: Secondary | ICD-10-CM

## 2017-06-07 DIAGNOSIS — N6489 Other specified disorders of breast: Secondary | ICD-10-CM | POA: Diagnosis not present

## 2017-06-28 ENCOUNTER — Other Ambulatory Visit: Payer: Self-pay | Admitting: General Surgery

## 2017-06-28 DIAGNOSIS — E785 Hyperlipidemia, unspecified: Secondary | ICD-10-CM | POA: Diagnosis not present

## 2017-06-28 DIAGNOSIS — R928 Other abnormal and inconclusive findings on diagnostic imaging of breast: Secondary | ICD-10-CM

## 2017-06-28 DIAGNOSIS — E119 Type 2 diabetes mellitus without complications: Secondary | ICD-10-CM | POA: Diagnosis not present

## 2017-06-28 DIAGNOSIS — I10 Essential (primary) hypertension: Secondary | ICD-10-CM | POA: Diagnosis not present

## 2017-06-28 DIAGNOSIS — F329 Major depressive disorder, single episode, unspecified: Secondary | ICD-10-CM | POA: Diagnosis not present

## 2017-06-28 DIAGNOSIS — L409 Psoriasis, unspecified: Secondary | ICD-10-CM | POA: Diagnosis not present

## 2017-07-11 ENCOUNTER — Ambulatory Visit: Payer: BLUE CROSS/BLUE SHIELD | Admitting: Family Medicine

## 2017-07-12 ENCOUNTER — Encounter (HOSPITAL_BASED_OUTPATIENT_CLINIC_OR_DEPARTMENT_OTHER): Payer: Self-pay | Admitting: *Deleted

## 2017-07-13 ENCOUNTER — Encounter (HOSPITAL_BASED_OUTPATIENT_CLINIC_OR_DEPARTMENT_OTHER)
Admission: RE | Admit: 2017-07-13 | Discharge: 2017-07-13 | Disposition: A | Discharge status: Home / Self Care | Payer: Medicare Other | Source: Ambulatory Visit | Attending: General Surgery | Admitting: General Surgery

## 2017-07-13 DIAGNOSIS — F329 Major depressive disorder, single episode, unspecified: Secondary | ICD-10-CM | POA: Diagnosis not present

## 2017-07-13 DIAGNOSIS — K219 Gastro-esophageal reflux disease without esophagitis: Secondary | ICD-10-CM | POA: Diagnosis not present

## 2017-07-13 DIAGNOSIS — Z87891 Personal history of nicotine dependence: Secondary | ICD-10-CM | POA: Diagnosis not present

## 2017-07-13 DIAGNOSIS — I10 Essential (primary) hypertension: Secondary | ICD-10-CM | POA: Diagnosis not present

## 2017-07-13 DIAGNOSIS — Z8261 Family history of arthritis: Secondary | ICD-10-CM | POA: Diagnosis not present

## 2017-07-13 DIAGNOSIS — E7800 Pure hypercholesterolemia, unspecified: Secondary | ICD-10-CM | POA: Diagnosis not present

## 2017-07-13 DIAGNOSIS — K5790 Diverticulosis of intestine, part unspecified, without perforation or abscess without bleeding: Secondary | ICD-10-CM | POA: Diagnosis not present

## 2017-07-13 DIAGNOSIS — N6021 Fibroadenosis of right breast: Secondary | ICD-10-CM | POA: Diagnosis present

## 2017-07-13 DIAGNOSIS — Z86010 Personal history of colonic polyps: Secondary | ICD-10-CM | POA: Diagnosis not present

## 2017-07-13 DIAGNOSIS — N6091 Unspecified benign mammary dysplasia of right breast: Secondary | ICD-10-CM | POA: Diagnosis not present

## 2017-07-13 DIAGNOSIS — Z8042 Family history of malignant neoplasm of prostate: Secondary | ICD-10-CM | POA: Diagnosis not present

## 2017-07-13 DIAGNOSIS — L409 Psoriasis, unspecified: Secondary | ICD-10-CM | POA: Diagnosis not present

## 2017-07-13 DIAGNOSIS — Z888 Allergy status to other drugs, medicaments and biological substances status: Secondary | ICD-10-CM | POA: Diagnosis not present

## 2017-07-13 DIAGNOSIS — Z8249 Family history of ischemic heart disease and other diseases of the circulatory system: Secondary | ICD-10-CM | POA: Diagnosis not present

## 2017-07-13 DIAGNOSIS — Z7982 Long term (current) use of aspirin: Secondary | ICD-10-CM | POA: Diagnosis not present

## 2017-07-13 DIAGNOSIS — Z841 Family history of disorders of kidney and ureter: Secondary | ICD-10-CM | POA: Diagnosis not present

## 2017-07-13 DIAGNOSIS — Z833 Family history of diabetes mellitus: Secondary | ICD-10-CM | POA: Diagnosis not present

## 2017-07-13 DIAGNOSIS — E119 Type 2 diabetes mellitus without complications: Secondary | ICD-10-CM | POA: Diagnosis not present

## 2017-07-13 DIAGNOSIS — Z79899 Other long term (current) drug therapy: Secondary | ICD-10-CM | POA: Diagnosis not present

## 2017-07-13 DIAGNOSIS — Z6830 Body mass index (BMI) 30.0-30.9, adult: Secondary | ICD-10-CM | POA: Diagnosis not present

## 2017-07-13 DIAGNOSIS — M549 Dorsalgia, unspecified: Secondary | ICD-10-CM | POA: Diagnosis not present

## 2017-07-13 DIAGNOSIS — K449 Diaphragmatic hernia without obstruction or gangrene: Secondary | ICD-10-CM | POA: Diagnosis not present

## 2017-07-13 DIAGNOSIS — E785 Hyperlipidemia, unspecified: Secondary | ICD-10-CM | POA: Diagnosis not present

## 2017-07-13 LAB — CBC WITH DIFFERENTIAL/PLATELET
Basophils Absolute: 0 10*3/uL (ref 0.0–0.1)
Basophils Relative: 0 %
EOS ABS: 0.1 10*3/uL (ref 0.0–0.7)
Eosinophils Relative: 2 %
HCT: 42.1 % (ref 36.0–46.0)
HEMOGLOBIN: 13.2 g/dL (ref 12.0–15.0)
LYMPHS ABS: 2.4 10*3/uL (ref 0.7–4.0)
Lymphocytes Relative: 30 %
MCH: 28.1 pg (ref 26.0–34.0)
MCHC: 31.4 g/dL (ref 30.0–36.0)
MCV: 89.8 fL (ref 78.0–100.0)
MONO ABS: 0.8 10*3/uL (ref 0.1–1.0)
MONOS PCT: 11 %
NEUTROS PCT: 57 %
Neutro Abs: 4.6 10*3/uL (ref 1.7–7.7)
Platelets: 300 10*3/uL (ref 150–400)
RBC: 4.69 MIL/uL (ref 3.87–5.11)
RDW: 13.6 % (ref 11.5–15.5)
WBC: 7.9 10*3/uL (ref 4.0–10.5)

## 2017-07-13 LAB — COMPREHENSIVE METABOLIC PANEL
ALK PHOS: 103 U/L (ref 38–126)
ALT: 29 U/L (ref 14–54)
ANION GAP: 7 (ref 5–15)
AST: 29 U/L (ref 15–41)
Albumin: 3.7 g/dL (ref 3.5–5.0)
BUN: 16 mg/dL (ref 6–20)
CALCIUM: 9.3 mg/dL (ref 8.9–10.3)
CO2: 30 mmol/L (ref 22–32)
Chloride: 102 mmol/L (ref 101–111)
Creatinine, Ser: 0.73 mg/dL (ref 0.44–1.00)
GFR calc Af Amer: >60 mL/min (ref >60)
GFR calc non Af Amer: >60 mL/min (ref >60)
Glucose, Bld: 163 mg/dL — ABNORMAL HIGH (ref 65–99)
POTASSIUM: 4.4 mmol/L (ref 3.5–5.1)
SODIUM: 139 mmol/L (ref 135–145)
TOTAL PROTEIN: 7 g/dL (ref 6.5–8.1)
Total Bilirubin: 0.6 mg/dL (ref 0.3–1.2)

## 2017-07-13 NOTE — Progress Notes (Signed)
Ensure pre surgery drink given with instructions, pt verbalized understanding. 

## 2017-07-14 NOTE — H&P (Signed)
Candice Guerrero Location: [facility] Patient #: [id number] DOB: 1951-09-24 Married / Language: English / Race: White Female       History of Present Illness        This is a 66 year old female, referred by Dr. Marin Guerrero at the [facility] for evaluation of complex sclerosing lesion right breast, lower outer quadrant. Candice Guerrero is her PCP.      She gets annual screening mammograms. No prior breast problems. Recent imaging shows category B density. Focal distortion posterior, slightly outer breast. Axillary ultrasound negative. No ultrasound correlate or mass. Image guided biopsy shows complex sclerosing lesion with calcifications.     Past history significant for non-insulin-dependent diabetes, hypertension, hyperlipidemia, controlled depression, psoriasis. D&C. Risks fracture.      Family history negative for breast or ovarian or colon cancer. Mother died of cardiac issues. Father died end-stage renal disease         She is married and lives in [place] has 2 children. Husband present today. 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 Quit smoking 2000. Takes alcohol occasionally      I discussed complex sclerosing lesion with her. I told her she probably does not have cancer but that there is a 4-9% risk of early cancer present right now. Conservative lumpectomy was advised. She very much would like to have that done and doesn't want to take any chances.     She'll be scheduled for right breast lumpectomy with RSL. I discussed the indications, details, techniques, and numerous risk of the surgery with her. She is aware of the risk of bleeding, infection, reoperation if cancer, cosmetic deformity, nerve damage with chronic pain, and other unforeseen problems. She understands all these issues. All of her questions are answered. She agrees with this plan.   Past Surgical History  Breast Biopsy   Right. Colon Polyp Removal - Colonoscopy  Oral Surgery  Tonsillectomy   Diagnostic Studies History  Colonoscopy  1-5 years ago Mammogram  within last year Pap Smear  1-5 years ago  Allergies  Hydrocodone-Acetaminophen *ANALGESICS - OPIOID*  Ramipril *ANTIHYPERTENSIVES*   Medication History  Atorvastatin Calcium (40MG  Tablet, Oral) Active. Aspirin (81MG  Tablet, Oral) Active. Calcium Carbonate (Antacid) (500MG  Tablet Chewable, Oral) Active. Vitamin D (Cholecalciferol) (1000UNIT Capsule, Oral) Active. CeleXA (10MG  Tablet, Oral two times daily) Active. Omeprazole (10MG  Capsule DR, Oral) Active. Valsartan (160MG  Capsule, Oral) Active. Citalopram Hydrobromide (20MG  Tablet, Oral) Active. Clobetasol Propionate (0.05% Cream, External) Active. Olux (0.05% Foam, apply to scalp External) Active. Diovan (80MG  Tablet, Oral) Active. Medications Reconciled  Social History  Alcohol use  Occasional alcohol use. Caffeine use  Coffee. No drug use  Tobacco use  Former smoker.  Family History  Arthritis  Father. Diabetes Mellitus  Family Members In General. Heart Disease  Mother. Heart disease in female family member before age 64  Hypertension  Father, Mother. Kidney Disease  Family Members In General, Father. Prostate Cancer  Brother.  Pregnancy / Birth History  Age at menarche  31 years. Age of menopause  53-50 Gravida  2 Maternal age  19-25 Para  2  Other Problems  Back Pain  Depression  Diabetes Mellitus  Diverticulosis  Gastroesophageal Reflux Disease  Hemorrhoids  High blood pressure  Hypercholesterolemia  Lump In Breast     Review of Systems  General Not Present- Appetite Loss, Chills, Fatigue, Fever, Night Sweats, Weight Gain and Weight Loss. Skin Present- Rash. Not Present- Change  in Wart/Mole, Dryness, Hives, Jaundice, New Lesions, Non-Healing Wounds and Ulcer. HEENT Present- Seasonal Allergies and Wears glasses/contact  lenses. Not Present- Earache, Hearing Loss, Hoarseness, Nose Bleed, Oral Ulcers, Ringing in the Ears, Sinus Pain, Sore Throat, Visual Disturbances and Yellow Eyes. Respiratory Present- Snoring. Not Present- Bloody sputum, Chronic Cough, Difficulty Breathing and Wheezing. Breast Present- Breast Mass. Not Present- Breast Pain, Nipple Discharge and Skin Changes. Cardiovascular Not Present- Chest Pain, Difficulty Breathing Lying Down, Leg Cramps, Palpitations, Rapid Heart Rate, Shortness of Breath and Swelling of Extremities. Gastrointestinal Present- Hemorrhoids and Indigestion. Not Present- Abdominal Pain, Bloating, Bloody Stool, Change in Bowel Habits, Chronic diarrhea, Constipation, Difficulty Swallowing, Excessive gas, Gets full quickly at meals, Nausea, Rectal Pain and Vomiting. Female Genitourinary Not Present- Frequency, Nocturia, Painful Urination, Pelvic Pain and Urgency. Musculoskeletal Present- Back Pain. Not Present- Joint Pain, Joint Stiffness, Muscle Pain, Muscle Weakness and Swelling of Extremities. Neurological Present- Headaches and Tremor. Not Present- Decreased Memory, Fainting, Numbness, Seizures, Tingling, Trouble walking and Weakness. Psychiatric Present- Anxiety. Not Present- Bipolar, Change in Sleep Pattern, Depression, Fearful and Frequent crying. Endocrine Present- Hot flashes. Not Present- Cold Intolerance, Excessive Hunger, Hair Changes, Heat Intolerance and New Diabetes. Hematology Present- Blood Thinners. Not Present- Easy Bruising, Excessive bleeding, Gland problems, HIV and Persistent Infections.  Vitals  Weight: 164.4 lb Height: 61in Body Surface Area: 1.74 m Body Mass Index: 31.06 kg/m  Temp.: 98.69F  Pulse: 101 (Regular)  BP: 145/82 (Sitting, Left Arm, Standard)    Physical Exam  General Mental Status-Alert. General Appearance-Consistent with stated age. Hydration-Well hydrated. Voice-Normal.  Head and Neck Head-normocephalic,  atraumatic with no lesions or palpable masses. Trachea-midline. Thyroid Gland Characteristics - normal size and consistency.  Eye Eyeball - Bilateral-Extraocular movements intact. Sclera/Conjunctiva - Bilateral-No scleral icterus.  Chest and Lung Exam Chest and lung exam reveals -quiet, even and easy respiratory effort with no use of accessory muscles and on auscultation, normal breath sounds, no adventitious sounds and normal vocal resonance. Inspection Chest Wall - Normal. Back - normal.  Breast Note: Breasts are medium size. Soft. Biopsy site right breast lower outer quadrant. No hematoma ecchymoses or palpable mass in either breast. No other skin changes. No axillary adenopathy.   Cardiovascular Cardiovascular examination reveals -normal heart sounds, regular rate and rhythm with no murmurs and normal pedal pulses bilaterally.  Abdomen Inspection Inspection of the abdomen reveals - No Hernias. Skin - Scar - no surgical scars. Palpation/Percussion Palpation and Percussion of the abdomen reveal - Soft, Non Tender, No Rebound tenderness, No Rigidity (guarding) and No hepatosplenomegaly. Auscultation Auscultation of the abdomen reveals - Bowel sounds normal.  Neurologic Neurologic evaluation reveals -alert and oriented x 3 with no impairment of recent or remote memory. Mental Status-Normal.  Musculoskeletal Normal Exam - Left-Upper Extremity Strength Normal and Lower Extremity Strength Normal. Normal Exam - Right-Upper Extremity Strength Normal and Lower Extremity Strength Normal.  Lymphatic Head & Neck  General Head & Neck Lymphatics: Bilateral - Description - Normal. Axillary  General Axillary Region: Bilateral - Description - Normal. Tenderness - Non Tender. Femoral & Inguinal  Generalized Femoral & Inguinal Lymphatics: Bilateral - Description - Normal. Tenderness - Non Tender.    Assessment & Plan  ABNORMALITY OF RIGHT BREAST ON SCREENING  MAMMOGRAM (R92.8)  Your recent screening mammogram show an area of distortion in the right breast, lower outer quadrant Biopsy shows complex sclerosing lesion with calcifications You probably do not have cancer However, this diagnosis carries a 5-10% risk of you having cancer right now Excision  is recommended and you have stated that he would like to go ahead with excision  you will be scheduled for right breast lumpectomy with radioactive seed localization We have discussed the indications, techniques, and risk of the surgery in detail.  TYPE 2 DIABETES MELLITUS TREATED WITHOUT INSULIN (E11.9) HYPERTENSION, ESSENTIAL (I10) HYPERLIPIDEMIA, ACQUIRED (E78.5) DEPRESSION, CONTROLLED (F32.9) PSORIASIS (L40.9)   Derek Laughter M. Dalbert Batman, M.D., [facility] General and Minimally invasive Surgery Breast and Colorectal Surgery Office:   [phone] Pager:   [phone]

## 2017-07-17 ENCOUNTER — Ambulatory Visit
Admission: RE | Admit: 2017-07-17 | Discharge: 2017-07-17 | Disposition: A | Discharge status: Home / Self Care | Payer: BLUE CROSS/BLUE SHIELD | Source: Ambulatory Visit | Attending: General Surgery | Admitting: General Surgery

## 2017-07-17 DIAGNOSIS — R928 Other abnormal and inconclusive findings on diagnostic imaging of breast: Secondary | ICD-10-CM

## 2017-07-18 ENCOUNTER — Ambulatory Visit (HOSPITAL_BASED_OUTPATIENT_CLINIC_OR_DEPARTMENT_OTHER)
Admission: RE | Admit: 2017-07-18 | Discharge: 2017-07-18 | Disposition: A | Discharge status: Home / Self Care | Payer: BLUE CROSS/BLUE SHIELD | Source: Ambulatory Visit | Attending: General Surgery | Admitting: General Surgery

## 2017-07-18 ENCOUNTER — Encounter (HOSPITAL_BASED_OUTPATIENT_CLINIC_OR_DEPARTMENT_OTHER)
Admission: RE | Disposition: A | Discharge status: Home / Self Care | Payer: Self-pay | Source: Ambulatory Visit | Attending: General Surgery

## 2017-07-18 ENCOUNTER — Encounter (HOSPITAL_BASED_OUTPATIENT_CLINIC_OR_DEPARTMENT_OTHER): Payer: Self-pay | Admitting: Anesthesiology

## 2017-07-18 ENCOUNTER — Ambulatory Visit
Admission: RE | Admit: 2017-07-18 | Discharge: 2017-07-18 | Disposition: A | Discharge status: Home / Self Care | Payer: BLUE CROSS/BLUE SHIELD | Source: Ambulatory Visit | Attending: General Surgery | Admitting: General Surgery

## 2017-07-18 ENCOUNTER — Ambulatory Visit (HOSPITAL_BASED_OUTPATIENT_CLINIC_OR_DEPARTMENT_OTHER): Payer: BLUE CROSS/BLUE SHIELD | Admitting: Anesthesiology

## 2017-07-18 DIAGNOSIS — Z8261 Family history of arthritis: Secondary | ICD-10-CM | POA: Insufficient documentation

## 2017-07-18 DIAGNOSIS — F329 Major depressive disorder, single episode, unspecified: Secondary | ICD-10-CM | POA: Diagnosis not present

## 2017-07-18 DIAGNOSIS — Z833 Family history of diabetes mellitus: Secondary | ICD-10-CM | POA: Insufficient documentation

## 2017-07-18 DIAGNOSIS — Z7982 Long term (current) use of aspirin: Secondary | ICD-10-CM | POA: Insufficient documentation

## 2017-07-18 DIAGNOSIS — L409 Psoriasis, unspecified: Secondary | ICD-10-CM | POA: Insufficient documentation

## 2017-07-18 DIAGNOSIS — D4861 Neoplasm of uncertain behavior of right breast: Secondary | ICD-10-CM | POA: Diagnosis not present

## 2017-07-18 DIAGNOSIS — K5790 Diverticulosis of intestine, part unspecified, without perforation or abscess without bleeding: Secondary | ICD-10-CM | POA: Insufficient documentation

## 2017-07-18 DIAGNOSIS — F418 Other specified anxiety disorders: Secondary | ICD-10-CM | POA: Diagnosis not present

## 2017-07-18 DIAGNOSIS — Z79899 Other long term (current) drug therapy: Secondary | ICD-10-CM | POA: Insufficient documentation

## 2017-07-18 DIAGNOSIS — I10 Essential (primary) hypertension: Secondary | ICD-10-CM | POA: Diagnosis not present

## 2017-07-18 DIAGNOSIS — E7800 Pure hypercholesterolemia, unspecified: Secondary | ICD-10-CM | POA: Diagnosis not present

## 2017-07-18 DIAGNOSIS — Z6830 Body mass index (BMI) 30.0-30.9, adult: Secondary | ICD-10-CM | POA: Insufficient documentation

## 2017-07-18 DIAGNOSIS — N6091 Unspecified benign mammary dysplasia of right breast: Secondary | ICD-10-CM | POA: Insufficient documentation

## 2017-07-18 DIAGNOSIS — K219 Gastro-esophageal reflux disease without esophagitis: Secondary | ICD-10-CM | POA: Insufficient documentation

## 2017-07-18 DIAGNOSIS — E785 Hyperlipidemia, unspecified: Secondary | ICD-10-CM | POA: Insufficient documentation

## 2017-07-18 DIAGNOSIS — R921 Mammographic calcification found on diagnostic imaging of breast: Secondary | ICD-10-CM | POA: Diagnosis not present

## 2017-07-18 DIAGNOSIS — N6081 Other benign mammary dysplasias of right breast: Secondary | ICD-10-CM | POA: Diagnosis not present

## 2017-07-18 DIAGNOSIS — Z888 Allergy status to other drugs, medicaments and biological substances status: Secondary | ICD-10-CM | POA: Insufficient documentation

## 2017-07-18 DIAGNOSIS — R928 Other abnormal and inconclusive findings on diagnostic imaging of breast: Secondary | ICD-10-CM | POA: Diagnosis present

## 2017-07-18 DIAGNOSIS — K449 Diaphragmatic hernia without obstruction or gangrene: Secondary | ICD-10-CM | POA: Insufficient documentation

## 2017-07-18 DIAGNOSIS — N62 Hypertrophy of breast: Secondary | ICD-10-CM | POA: Diagnosis not present

## 2017-07-18 DIAGNOSIS — E119 Type 2 diabetes mellitus without complications: Secondary | ICD-10-CM | POA: Diagnosis not present

## 2017-07-18 DIAGNOSIS — Z86010 Personal history of colonic polyps: Secondary | ICD-10-CM | POA: Insufficient documentation

## 2017-07-18 DIAGNOSIS — Z87891 Personal history of nicotine dependence: Secondary | ICD-10-CM | POA: Insufficient documentation

## 2017-07-18 DIAGNOSIS — Z8042 Family history of malignant neoplasm of prostate: Secondary | ICD-10-CM | POA: Insufficient documentation

## 2017-07-18 DIAGNOSIS — N6489 Other specified disorders of breast: Secondary | ICD-10-CM | POA: Diagnosis not present

## 2017-07-18 DIAGNOSIS — Z841 Family history of disorders of kidney and ureter: Secondary | ICD-10-CM | POA: Insufficient documentation

## 2017-07-18 DIAGNOSIS — J309 Allergic rhinitis, unspecified: Secondary | ICD-10-CM | POA: Diagnosis not present

## 2017-07-18 DIAGNOSIS — Z8249 Family history of ischemic heart disease and other diseases of the circulatory system: Secondary | ICD-10-CM | POA: Insufficient documentation

## 2017-07-18 DIAGNOSIS — M549 Dorsalgia, unspecified: Secondary | ICD-10-CM | POA: Insufficient documentation

## 2017-07-18 HISTORY — PX: BREAST LUMPECTOMY WITH RADIOACTIVE SEED LOCALIZATION: SHX6424

## 2017-07-18 HISTORY — DX: Other abnormal and inconclusive findings on diagnostic imaging of breast: R92.8

## 2017-07-18 LAB — GLUCOSE, CAPILLARY
GLUCOSE-CAPILLARY: 106 mg/dL — AB (ref 65–99)
Glucose-Capillary: 101 mg/dL — ABNORMAL HIGH (ref 65–99)

## 2017-07-18 SURGERY — BREAST LUMPECTOMY WITH RADIOACTIVE SEED LOCALIZATION
Anesthesia: General | Site: Breast | Laterality: Right

## 2017-07-18 MED ORDER — FENTANYL CITRATE (PF) 100 MCG/2ML IJ SOLN
INTRAMUSCULAR | Status: AC
  Filled 2017-07-18: qty 2

## 2017-07-18 MED ORDER — ACETAMINOPHEN 650 MG RE SUPP: 650.0000 mg | RECTAL | Status: DC | PRN

## 2017-07-18 MED ORDER — ONDANSETRON HCL 4 MG/2ML IJ SOLN
INTRAMUSCULAR | Status: DC | PRN
  Administered 2017-07-18: 4 mg via INTRAVENOUS

## 2017-07-18 MED ORDER — GABAPENTIN 300 MG PO CAPS
300.0000 mg | ORAL_CAPSULE | ORAL | Status: AC
  Administered 2017-07-18: 300 mg via ORAL

## 2017-07-18 MED ORDER — CHLORHEXIDINE GLUCONATE CLOTH 2 % EX PADS: 6.0000 | MEDICATED_PAD | Freq: Once | CUTANEOUS | Status: DC

## 2017-07-18 MED ORDER — DEXAMETHASONE SODIUM PHOSPHATE 4 MG/ML IJ SOLN
INTRAMUSCULAR | Status: DC | PRN
  Administered 2017-07-18: 4 mg via INTRAVENOUS

## 2017-07-18 MED ORDER — LACTATED RINGERS IV SOLN: INTRAVENOUS | Status: DC

## 2017-07-18 MED ORDER — FENTANYL CITRATE (PF) 100 MCG/2ML IJ SOLN: 25.0000 ug | INTRAMUSCULAR | Status: DC | PRN

## 2017-07-18 MED ORDER — PROPOFOL 10 MG/ML IV BOLUS
INTRAVENOUS | Status: DC | PRN
  Administered 2017-07-18: 170 mg via INTRAVENOUS

## 2017-07-18 MED ORDER — LACTATED RINGERS IV SOLN
INTRAVENOUS | Status: DC
  Administered 2017-07-18 (×2): via INTRAVENOUS

## 2017-07-18 MED ORDER — HYDROCODONE-ACETAMINOPHEN 5-325 MG PO TABS: 1.0000 | ORAL_TABLET | Freq: Four times a day (QID) | ORAL | 0 refills | Status: DC | PRN

## 2017-07-18 MED ORDER — CEFAZOLIN SODIUM-DEXTROSE 2-4 GM/100ML-% IV SOLN
2.0000 g | INTRAVENOUS | Status: AC
  Administered 2017-07-18: 2 g via INTRAVENOUS

## 2017-07-18 MED ORDER — CEFAZOLIN SODIUM-DEXTROSE 2-4 GM/100ML-% IV SOLN
INTRAVENOUS | Status: AC
  Filled 2017-07-18: qty 100

## 2017-07-18 MED ORDER — ACETAMINOPHEN 500 MG PO TABS
1000.0000 mg | ORAL_TABLET | ORAL | Status: AC
  Administered 2017-07-18: 1000 mg via ORAL

## 2017-07-18 MED ORDER — MIDAZOLAM HCL 2 MG/2ML IJ SOLN
INTRAMUSCULAR | Status: AC
  Filled 2017-07-18: qty 2

## 2017-07-18 MED ORDER — MIDAZOLAM HCL 2 MG/2ML IJ SOLN
1.0000 mg | INTRAMUSCULAR | Status: DC | PRN
  Administered 2017-07-18: 2 mg via INTRAVENOUS

## 2017-07-18 MED ORDER — ACETAMINOPHEN 500 MG PO TABS
ORAL_TABLET | ORAL | Status: AC
  Filled 2017-07-18: qty 2

## 2017-07-18 MED ORDER — ACETAMINOPHEN 325 MG PO TABS: 650.0000 mg | ORAL_TABLET | ORAL | Status: DC | PRN

## 2017-07-18 MED ORDER — EPHEDRINE SULFATE 50 MG/ML IJ SOLN
INTRAMUSCULAR | Status: DC | PRN
  Administered 2017-07-18: 10 mg via INTRAVENOUS

## 2017-07-18 MED ORDER — SODIUM CHLORIDE 0.9% FLUSH: 3.0000 mL | INTRAVENOUS | Status: DC | PRN

## 2017-07-18 MED ORDER — SODIUM CHLORIDE 0.9% FLUSH: 3.0000 mL | Freq: Two times a day (BID) | INTRAVENOUS | Status: DC

## 2017-07-18 MED ORDER — LIDOCAINE 2% (20 MG/ML) 5 ML SYRINGE
INTRAMUSCULAR | Status: DC | PRN
Start: 2017-07-18 — End: 2017-07-18
  Administered 2017-07-18: 60 mg via INTRAVENOUS

## 2017-07-18 MED ORDER — FENTANYL CITRATE (PF) 100 MCG/2ML IJ SOLN
25.0000 ug | INTRAMUSCULAR | Status: DC | PRN
  Administered 2017-07-18: 25 ug via INTRAVENOUS

## 2017-07-18 MED ORDER — 0.9 % SODIUM CHLORIDE (POUR BTL) OPTIME
TOPICAL | Status: DC | PRN
  Administered 2017-07-18: 1000 mL

## 2017-07-18 MED ORDER — CELECOXIB 200 MG PO CAPS
200.0000 mg | ORAL_CAPSULE | ORAL | Status: AC
Start: 2017-07-18 — End: 2017-07-18
  Administered 2017-07-18: 200 mg via ORAL

## 2017-07-18 MED ORDER — OXYCODONE HCL 5 MG PO TABS: 5.0000 mg | ORAL_TABLET | ORAL | Status: DC | PRN

## 2017-07-18 MED ORDER — GABAPENTIN 300 MG PO CAPS
ORAL_CAPSULE | ORAL | Status: AC
  Filled 2017-07-18: qty 1

## 2017-07-18 MED ORDER — BUPIVACAINE-EPINEPHRINE (PF) 0.5% -1:200000 IJ SOLN
INTRAMUSCULAR | Status: DC | PRN
  Administered 2017-07-18: 10 mL

## 2017-07-18 MED ORDER — FENTANYL CITRATE (PF) 100 MCG/2ML IJ SOLN
50.0000 ug | INTRAMUSCULAR | Status: DC | PRN
  Administered 2017-07-18 (×2): 50 ug via INTRAVENOUS

## 2017-07-18 MED ORDER — SODIUM CHLORIDE 0.9 % IV SOLN: 250.0000 mL | INTRAVENOUS | Status: DC | PRN

## 2017-07-18 MED ORDER — CELECOXIB 200 MG PO CAPS
ORAL_CAPSULE | ORAL | Status: AC
  Filled 2017-07-18: qty 1

## 2017-07-18 MED ORDER — SCOPOLAMINE 1 MG/3DAYS TD PT72: 1.0000 | MEDICATED_PATCH | Freq: Once | TRANSDERMAL | Status: DC | PRN

## 2017-07-18 SURGICAL SUPPLY — 40 items
APPLIER CLIP 9.375 MED OPEN (MISCELLANEOUS) ×2
BINDER BREAST LRG (GAUZE/BANDAGES/DRESSINGS) ×2 IMPLANT
BLADE HEX COATED 2.75 (ELECTRODE) ×2 IMPLANT
BLADE SURG 15 STRL LF DISP TIS (BLADE) ×1 IMPLANT
BLADE SURG 15 STRL SS (BLADE) ×1
CANISTER SUCT 1200ML W/VALVE (MISCELLANEOUS) ×2 IMPLANT
CHLORAPREP W/TINT 26ML (MISCELLANEOUS) ×2 IMPLANT
CLIP APPLIE 9.375 MED OPEN (MISCELLANEOUS) ×1 IMPLANT
COVER BACK TABLE 60X90IN (DRAPES) ×2 IMPLANT
COVER MAYO STAND STRL (DRAPES) ×2 IMPLANT
COVER PROBE W GEL 5X96 (DRAPES) ×2 IMPLANT
DERMABOND ADVANCED (GAUZE/BANDAGES/DRESSINGS) ×1
DERMABOND ADVANCED .7 DNX12 (GAUZE/BANDAGES/DRESSINGS) ×1 IMPLANT
DEVICE DUBIN W/COMP PLATE 8390 (MISCELLANEOUS) ×2 IMPLANT
DRAPE LAPAROSCOPIC ABDOMINAL (DRAPES) ×2 IMPLANT
DRAPE UTILITY XL STRL (DRAPES) ×2 IMPLANT
DRSG PAD ABDOMINAL 8X10 ST (GAUZE/BANDAGES/DRESSINGS) ×2 IMPLANT
ELECT REM PT RETURN 9FT ADLT (ELECTROSURGICAL) ×2
ELECTRODE REM PT RTRN 9FT ADLT (ELECTROSURGICAL) ×1 IMPLANT
GAUZE SPONGE 4X4 12PLY STRL (GAUZE/BANDAGES/DRESSINGS) ×2 IMPLANT
GAUZE SPONGE 4X4 12PLY STRL LF (GAUZE/BANDAGES/DRESSINGS) ×2 IMPLANT
GLOVE EUDERMIC 7 POWDERFREE (GLOVE) ×2 IMPLANT
GOWN STRL REUS W/ TWL LRG LVL3 (GOWN DISPOSABLE) ×1 IMPLANT
GOWN STRL REUS W/ TWL XL LVL3 (GOWN DISPOSABLE) ×1 IMPLANT
GOWN STRL REUS W/TWL LRG LVL3 (GOWN DISPOSABLE) ×1
GOWN STRL REUS W/TWL XL LVL3 (GOWN DISPOSABLE) ×1
KIT MARKER MARGIN INK (KITS) ×2 IMPLANT
NEEDLE HYPO 25X1 1.5 SAFETY (NEEDLE) ×2 IMPLANT
NS IRRIG 1000ML POUR BTL (IV SOLUTION) ×2 IMPLANT
PACK BASIN DAY SURGERY FS (CUSTOM PROCEDURE TRAY) ×2 IMPLANT
PENCIL BUTTON HOLSTER BLD 10FT (ELECTRODE) ×2 IMPLANT
SLEEVE SCD COMPRESS KNEE MED (MISCELLANEOUS) ×2 IMPLANT
SPONGE LAP 4X18 X RAY DECT (DISPOSABLE) ×2 IMPLANT
SUT MNCRL AB 4-0 PS2 18 (SUTURE) ×2 IMPLANT
SUT SILK 2 0 SH (SUTURE) ×2 IMPLANT
SUT VICRYL 3-0 CR8 SH (SUTURE) ×2 IMPLANT
SYR 10ML LL (SYRINGE) ×2 IMPLANT
TOWEL OR 17X24 6PK STRL BLUE (TOWEL DISPOSABLE) ×2 IMPLANT
TUBE CONNECTING 20X1/4 (TUBING) ×2 IMPLANT
YANKAUER SUCT BULB TIP NO VENT (SUCTIONS) ×2 IMPLANT

## 2017-07-18 NOTE — Anesthesia Procedure Notes (Signed)
Procedure Name: LMA Insertion Date/Time: 07/18/2017 10:04 AM Performed by: Lieutenant Diego Pre-anesthesia Checklist: Patient identified, Emergency Drugs available, Suction available and Patient being monitored Patient Re-evaluated:Patient Re-evaluated prior to induction Oxygen Delivery Method: Circle system utilized Preoxygenation: Pre-oxygenation with 100% oxygen Induction Type: IV induction Ventilation: Mask ventilation without difficulty LMA: LMA inserted LMA Size: 4.0 Number of attempts: 1 Airway Equipment and Method: Bite block Placement Confirmation: positive ETCO2 and breath sounds checked- equal and bilateral Tube secured with: Tape Dental Injury: Teeth and Oropharynx as per pre-operative assessment

## 2017-07-18 NOTE — Anesthesia Preprocedure Evaluation (Signed)
Anesthesia Evaluation  Patient identified by MRN, date of birth, ID band Patient awake    Reviewed: Allergy & Precautions, H&P , NPO status , Patient's Chart, lab work & pertinent test results  History of Anesthesia Complications Negative for: history of anesthetic complications  Airway Mallampati: II  TM Distance: >3 FB Neck ROM: Full    Dental no notable dental hx. (+) Teeth Intact, Dental Advisory Given   Pulmonary former smoker,    Pulmonary exam normal breath sounds clear to auscultation       Cardiovascular hypertension, Pt. on medications Normal cardiovascular exam Rhythm:Regular Rate:Normal     Neuro/Psych neg Headaches, PSYCHIATRIC DISORDERS Depression negative neurological ROS     GI/Hepatic Neg liver ROS, hiatal hernia, GERD  Medicated and Controlled,  Endo/Other  diabetes, Well Controlled, Type 2, Oral Hypoglycemic AgentsMorbid obesity  Renal/GU negative Renal ROS     Musculoskeletal   Abdominal (+) + obese,   Peds  Hematology   Anesthesia Other Findings   Reproductive/Obstetrics                             Anesthesia Physical  Anesthesia Plan  ASA: III  Anesthesia Plan: General   Post-op Pain Management:    Induction: Intravenous  PONV Risk Score and Plan:   Airway Management Planned: LMA  Additional Equipment:   Intra-op Plan:   Post-operative Plan: Extubation in OR  Informed Consent: I have reviewed the patients History and Physical, chart, labs and discussed the procedure including the risks, benefits and alternatives for the proposed anesthesia with the patient or authorized representative who has indicated his/her understanding and acceptance.   Dental advisory given  Plan Discussed with: CRNA  Anesthesia Plan Comments:                                          Anesthesia Evaluation  Patient identified by MRN, date of birth, ID band Patient  awake    Reviewed: Allergy & Precautions, H&P , NPO status , Patient's Chart, lab work & pertinent test results  History of Anesthesia Complications Negative for: history of anesthetic complications  Airway Mallampati: II  TM Distance: >3 FB Neck ROM: Full    Dental no notable dental hx. (+) Teeth Intact, Dental Advisory Given   Pulmonary Recent URI , Resolved, former smoker,    Pulmonary exam normal breath sounds clear to auscultation       Cardiovascular hypertension, Pt. on medications Normal cardiovascular exam Rhythm:Regular Rate:Normal     Neuro/Psych neg Headaches, PSYCHIATRIC DISORDERS Depression negative neurological ROS     GI/Hepatic Neg liver ROS, hiatal hernia, GERD  Medicated and Controlled,  Endo/Other  diabetes, Well Controlled, Type 2, Oral Hypoglycemic AgentsMorbid obesity  Renal/GU negative Renal ROS     Musculoskeletal   Abdominal (+) + obese,   Peds  Hematology   Anesthesia Other Findings   Reproductive/Obstetrics                             Anesthesia Physical  Anesthesia Plan  ASA: III  Anesthesia Plan: General   Post-op Pain Management:    Induction: Intravenous  Airway Management Planned: Nasal ETT  Additional Equipment:   Intra-op Plan:   Post-operative Plan:   Informed Consent: I have reviewed the patients History and Physical, chart,  labs and discussed the procedure including the risks, benefits and alternatives for the proposed anesthesia with the patient or authorized representative who has indicated his/her understanding and acceptance.   Dental advisory given  Plan Discussed with: CRNA  Anesthesia Plan Comments:         Anesthesia Quick Evaluation  Anesthesia Quick Evaluation

## 2017-07-18 NOTE — Transfer of Care (Signed)
Immediate Anesthesia Transfer of Care Note  Patient: Candice Guerrero  Procedure(s) Performed: RIGHT BREAST LUMPECTOMY WITH RADIOACTIVE SEED LOCALIZATION (Right Breast)  Patient Location: PACU  Anesthesia Type:General  Level of Consciousness: awake  Airway & Oxygen Therapy: Patient Spontanous Breathing and Patient connected to face mask oxygen  Post-op Assessment: Report given to RN and Post -op Vital signs reviewed and stable  Post vital signs: Reviewed and stable  Last Vitals:  Vitals:   07/18/17 0918  BP: 138/70  Pulse: 73  Resp: 20  Temp: 36.8 C  SpO2: 99%    Last Pain:  Vitals:   07/18/17 0918  TempSrc: Oral         Complications: No apparent anesthesia complications

## 2017-07-18 NOTE — Discharge Instructions (Signed)
[facility] °Office Phone Number [phone] ° °BREAST BIOPSY/ PARTIAL MASTECTOMY: POST OP INSTRUCTIONS ° °Always review your discharge instruction sheet given to you by the facility where your surgery was performed. ° °IF YOU HAVE DISABILITY OR FAMILY LEAVE FORMS, YOU MUST BRING THEM TO THE OFFICE FOR PROCESSING.  DO NOT GIVE THEM TO YOUR DOCTOR. ° °1. A prescription for pain medication may be given to you upon discharge.  Take your pain medication as prescribed, if needed.  If narcotic pain medicine is not needed, then you may take acetaminophen (Tylenol) or ibuprofen (Advil) as needed. °2. Take your usually prescribed medications unless otherwise directed °3. If you need a refill on your pain medication, please contact your pharmacy.  They will contact our office to request authorization.  Prescriptions will not be filled after 5pm or on week-ends. °4. You should eat very light the first 24 hours after surgery, such as soup, crackers, pudding, etc.  Resume your normal diet the day after surgery. °5. Most patients will experience some swelling and bruising in the breast.  Ice packs and a good support bra will help.  Swelling and bruising can take several days to resolve.  °6. It is common to experience some constipation if taking pain medication after surgery.  Increasing fluid intake and taking a stool softener will usually help or prevent this problem from occurring.  A mild laxative (Milk of Magnesia or Miralax) should be taken according to package directions if there are no bowel movements after 48 hours. °7. Unless discharge instructions indicate otherwise, you may remove your bandages 24-48 hours after surgery, and you may shower at that time.  You may have steri-strips (small skin tapes) in place directly over the incision.  These strips should be left on the skin for 7-10 days.  If your surgeon used skin glue on the incision, you may shower in 24 hours.  The glue will flake off over the  next 2-3 weeks.  Any sutures or staples will be removed at the office during your follow-up visit. °8. ACTIVITIES:  You may resume regular daily activities (gradually increasing) beginning the next day.  Wearing a good support bra or sports bra minimizes pain and swelling.  You may have sexual intercourse when it is comfortable. °a. You may drive when you no longer are taking prescription pain medication, you can comfortably wear a seatbelt, and you can safely maneuver your car and apply brakes. °b. RETURN TO WORK:  ______________________________________________________________________________________ °9. You should see your doctor in the office for a follow-up appointment approximately two weeks after your surgery.  Your doctor’s nurse will typically make your follow-up appointment when she calls you with your pathology report.  Expect your pathology report 2-3 business days after your surgery.  You may call to check if you do not hear from us after three days. °10. OTHER INSTRUCTIONS: _______________________________________________________________________________________________ _____________________________________________________________________________________________________________________________________ °_____________________________________________________________________________________________________________________________________ °_____________________________________________________________________________________________________________________________________ ° °WHEN TO CALL YOUR DOCTOR: °1. Fever over 101.0 °2. Nausea and/or vomiting. °3. Extreme swelling or bruising. °4. Continued bleeding from incision. °5. Increased pain, redness, or drainage from the incision. ° °The clinic staff is available to answer your questions during regular business hours.  Please don’t hesitate to call and ask to speak to one of the nurses for clinical concerns.  If you have a medical emergency, go to the nearest  emergency room or call 911.  A surgeon from [facility] is always on call at the hospital. ° °For further questions, please visit [email or web address]  ° ° ° ° °  Post Anesthesia Home Care Instructions ° °Activity: °Get plenty of rest for the remainder of the day. A responsible individual must stay with you for 24 hours following the procedure.  °For the next 24 hours, DO NOT: °-Drive a car °-Operate machinery °-Drink alcoholic beverages °-Take any medication unless instructed by your physician °-Make any legal decisions or sign important papers. ° °Meals: °Start with liquid foods such as gelatin or soup. Progress to regular foods as tolerated. Avoid greasy, spicy, heavy foods. If nausea and/or vomiting occur, drink only clear liquids until the nausea and/or vomiting subsides. Call your physician if vomiting continues. ° °Special Instructions/Symptoms: °Your throat may feel dry or sore from the anesthesia or the breathing tube placed in your throat during surgery. If this causes discomfort, gargle with warm salt water. The discomfort should disappear within 24 hours. ° °If you had a scopolamine patch placed behind your ear for the management of post- operative nausea and/or vomiting: ° °1. The medication in the patch is effective for 72 hours, after which it should be removed.  Wrap patch in a tissue and discard in the trash. Wash hands thoroughly with soap and water. °2. You may remove the patch earlier than 72 hours if you experience unpleasant side effects which may include dry mouth, dizziness or visual disturbances. °3. Avoid touching the patch. Wash your hands with soap and water after contact with the patch. °  ° °

## 2017-07-18 NOTE — Interval H&P Note (Signed)
History and Physical Interval Note:  07/18/2017 9:37 AM  Candice Guerrero  has presented today for surgery, with the diagnosis of RIGHT BREAST ABNORMAL MAMMOGRAM  The various methods of treatment have been discussed with the patient and family. After consideration of risks, benefits and other options for treatment, the patient has consented to  Procedure(s): RIGHT BREAST LUMPECTOMY WITH RADIOACTIVE SEED LOCALIZATION (Right) as a surgical intervention .  The patient's history has been reviewed, patient examined, no change in status, stable for surgery.  I have reviewed the patient's chart and labs.  Questions were answered to the patient's satisfaction.     [name]

## 2017-07-18 NOTE — Op Note (Signed)
Patient Name:           Candice Guerrero   Date of Surgery:        07/18/2017  Pre op Diagnosis:      Complex sclerosing lesion right breast, central location  Post op Diagnosis:    Same  Procedure:                 right breast lumpectomy with radioactive seed localization  Surgeon:                     Candice Guerrero, M.D., FACS  Assistant:                      OR staff   Indication for Assistant: N/A  Operative Indications:   This is a 66 year old female, referred by Dr. Marin Guerrero at the [facility] for evaluation of complex sclerosing lesion right breast, lower outer quadrant. Candice Guerrero is her PCP.  She is brought to the operating room electively for excision.      She gets annual screening mammograms. No prior breast problems. Recent imaging shows category B density. Focal distortion posterior, slightly outer breast. Axillary ultrasound negative. No ultrasound correlate or mass. Image guided biopsy shows complex sclerosing lesion with calcifications.      Family history negative for breast or ovarian or colon cancer. Candice Guerrero died of cardiac issues. Candice Guerrero died end-stage renal disease      I discussed complex sclerosing lesion with her. I told her she probably does not have cancer but that there is a 4-9% risk of early cancer present right now. Conservative lumpectomy was advised. She very much would like to have that done and doesn't want to take any chances.     She'll be scheduled for right breast lumpectomy with RSL. She agrees with this plan.  Operative Findings:       The preop imaging studies suggested that the complex grossly lesion was in the right breast, lower inner quadrant.  The radioactive seed placement was actually centrally located in the retroareolar area.  The specimen mammogram looked good with radioactive seed in the center of the specimen and the seed was retrieved by the pathologist.  Procedure in Detail:          Following the induction of general LMA  anesthesia the patient's right breast was prepped and draped in the usual sterile fashion.  Surgical timeout was performed.  Intravenous antibiotic given.  0.5% Marcaine was used as a local infiltration anesthetic.    Using the neoprobe I found that the reactive seed was behind and slightly inferior to the inferior areolar margin.  A circumareolar incision was made in the inferior hemisphere of the areolar complex.  Lumpectomy was performed using electrocautery and the neoprobe.  The specimen was removed and marked with silk sutures and a 6 color ink kit.    Specimen mammogram looked good as mentioned above.  The  pathologist retrieved the  seed.  The wound was irrigated and hemostasis achieved.  5 [place] clips were placed in the walls of the lumpectomy cavity.  The breast tissues were reapproximated with 3-0 Vicryl sutures and the skin closed with a running subcuticular 4-0 Monocryl and Dermabond.  Dry bandages and a breast binder were placed.  The patient tolerated the procedure well was taken to PACU in stable condition.  EBL 10 mL.  Counts correct.  Complications none.     Candice Guerrero, M.D., [place]  [facility] Breast and Colorectal Surgery    Addendum: I logged on to the [place] website and reviewed her prescription medication history  07/18/2017 10:48 AM

## 2017-07-19 ENCOUNTER — Encounter (HOSPITAL_BASED_OUTPATIENT_CLINIC_OR_DEPARTMENT_OTHER): Payer: Self-pay | Admitting: General Surgery

## 2017-07-19 NOTE — Anesthesia Postprocedure Evaluation (Signed)
Anesthesia Post Note  Patient: [place]  Procedure(s) Performed: RIGHT BREAST LUMPECTOMY WITH RADIOACTIVE SEED LOCALIZATION (Right Breast)     Patient location during evaluation: PACU Anesthesia Type: General Level of consciousness: awake and alert Pain management: pain level controlled Vital Signs Assessment: post-procedure vital signs reviewed and stable Respiratory status: spontaneous breathing, nonlabored ventilation, respiratory function stable and patient connected to nasal cannula oxygen Cardiovascular status: blood pressure returned to baseline and stable Postop Assessment: no apparent nausea or vomiting Anesthetic complications: no    Last Vitals:  Vitals:   07/18/17 1130 07/18/17 1153  BP: 107/69 127/67  Pulse: 81 81  Resp: 12 16  Temp:  36.9 C  SpO2: 96% 97%    Last Pain:  Vitals:   07/19/17 0954  TempSrc:   PainSc: 1                  Tashauna Caisse EDWARD

## 2017-07-20 NOTE — Progress Notes (Signed)
Inform patient of Pathology report,. Tell her pathology benign. Shows atypical hyperplasia, but no cancer. Will discuss in detail in office. Let me know you reached her.

## 2017-07-30 ENCOUNTER — Ambulatory Visit (INDEPENDENT_AMBULATORY_CARE_PROVIDER_SITE_OTHER): Payer: BLUE CROSS/BLUE SHIELD | Admitting: Family Medicine

## 2017-07-30 ENCOUNTER — Encounter: Payer: Self-pay | Admitting: Family Medicine

## 2017-07-30 VITALS — BP 132/84 | HR 68 | Temp 98.7°F | Ht 61.0 in | Wt 164.8 lb

## 2017-07-30 DIAGNOSIS — E119 Type 2 diabetes mellitus without complications: Secondary | ICD-10-CM

## 2017-07-30 DIAGNOSIS — Z23 Encounter for immunization: Secondary | ICD-10-CM

## 2017-07-30 DIAGNOSIS — I10 Essential (primary) hypertension: Secondary | ICD-10-CM

## 2017-07-30 DIAGNOSIS — E1159 Type 2 diabetes mellitus with other circulatory complications: Secondary | ICD-10-CM

## 2017-07-30 DIAGNOSIS — I152 Hypertension secondary to endocrine disorders: Secondary | ICD-10-CM

## 2017-07-30 DIAGNOSIS — E785 Hyperlipidemia, unspecified: Secondary | ICD-10-CM

## 2017-07-30 LAB — LDL CHOLESTEROL, DIRECT: LDL DIRECT: 114 mg/dL

## 2017-07-30 LAB — HEMOGLOBIN A1C: Hgb A1c MFr Bld: 7.2 % — ABNORMAL HIGH (ref 4.6–6.5)

## 2017-07-30 MED ORDER — METFORMIN HCL 500 MG PO TABS: 500.0000 mg | ORAL_TABLET | Freq: Two times a day (BID) | ORAL | 3 refills | Status: DC

## 2017-07-30 NOTE — Addendum Note (Signed)
Addended by: [name] on: 07/30/2017 08:21 PM   Modules accepted: Orders

## 2017-07-30 NOTE — Patient Instructions (Addendum)
Please stop by lab before you go   

## 2017-07-30 NOTE — Assessment & Plan Note (Signed)
S: controlled on valsartan 80-hctz 12.5mg  BP Readings from Last 3 Encounters:  07/30/17 132/84  07/18/17 127/67  03/14/17 138/88  A/P: We discussed blood pressure goal of <140/90. Continue current meds

## 2017-07-30 NOTE — Addendum Note (Signed)
Addended by: Mariam Dollar, Roselyn Reef M on: 07/30/2017 10:50 AM   Modules accepted: Orders

## 2017-07-30 NOTE — Progress Notes (Signed)
Subjective:  Candice Guerrero is a 66 y.o. year old very pleasant female patient who presents for/with See problem oriented charting ROS- No chest pain or shortness of breath. No headache or blurry vision. No hypoglycemia.    Past Medical History-  Patient Active Problem List   Diagnosis Date Noted  . Diabetes mellitus type II, controlled, with no complications ([place]) 74/09/8785    Priority: High  . IBS (irritable bowel syndrome) 07/24/2014    Priority: Medium  . Psoriasis 12/13/2010    Priority: Medium  . Hypertension associated with diabetes ([place]) 12/13/2010    Priority: Medium  . Depression with anxiety 12/13/2010    Priority: Medium  . Fatty liver 12/13/2010    Priority: Medium  . Hyperlipemia 12/13/2010    Priority: Medium  . Osteopenia 07/24/2014    Priority: Low  . Allergic rhinitis 07/24/2014    Priority: Low  . Obesity (BMI 30-39.9) 12/31/2013    Priority: Low  . Benign hematuria 12/13/2010    Priority: Low  . GERD (gastroesophageal reflux disease) 12/13/2010    Priority: Low  . Abnormal mammogram of right breast 07/18/2017    Medications- reviewed and updated Current Outpatient Prescriptions  Medication Sig Dispense Refill  . aspirin 81 MG tablet Take 81 mg by mouth daily.      Marland Kitchen aspirin-acetaminophen-caffeine (EXCEDRIN MIGRAINE) 250-250-65 MG per tablet Take 1 tablet by mouth every 6 (six) hours as needed. Reported on 10/21/2015    . atorvastatin (LIPITOR) 40 MG tablet Take 1 tablet (40 mg total) by mouth daily. 90 tablet 3  . calcipotriene-betamethasone (TACLONEX) ointment Apply topically daily. 60 g 0  . Calcium Carbonate (CALTRATE 600) 1500 MG TABS Take by mouth daily.      . citalopram (CELEXA) 20 MG tablet Take 1 tablet by mouth  daily 90 tablet 3  . metFORMIN (GLUCOPHAGE) 500 MG tablet Take 1 tablet (500 mg total) by mouth daily with breakfast. 90 tablet 3  . mometasone (NASONEX) 50 MCG/ACT nasal spray Place 2 sprays into the nose daily. 17 g 11  . omeprazole  (PRILOSEC) 40 MG capsule Take 1 capsule by mouth  daily 90 capsule 3  . valsartan-hydrochlorothiazide (DIOVAN-HCT) 80-12.5 MG tablet Take 1 tablet by mouth  daily 90 tablet 3  . VITAMIN D, ERGOCALCIFEROL, PO Take by mouth daily.     No current facility-administered medications for this visit.     Objective: BP 132/84 (BP Location: Left Arm, Patient Position: Sitting, Cuff Size: Large)   Pulse 68   Temp 98.7 F (37.1 C) (Oral)   Ht 5\' 1"  (1.549 m)   Wt 164 lb 12.8 oz (74.8 kg)   SpO2 97%   BMI 31.14 kg/m  Gen: NAD, resting comfortably CV: RRR no murmurs rubs or gallops Lungs: CTAB no crackles, wheeze, rhonchi Abdomen: soft/nontender/nondistended/normal bowel sounds. No rebound or guarding.  Ext: no edema Skin: warm, dry  Assessment/Plan:  Hypertension associated with diabetes S: controlled on valsartan 80-hctz 12.5mg  BP Readings from Last 3 Encounters:  07/30/17 132/84  07/18/17 127/67  03/14/17 138/88  A/P: We discussed blood pressure goal of <140/90. Continue current meds  Diabetes mellitus type II, controlled, with no complications (HCC) S: mild poorly controlled- on metformin once a day. On last check with a1c up to 7.3. Discussed dietary changes- she wanted to be aggressive about lifestyle. Weight down 2 lbs CBGs- 103 at hospital when fasting, no regular checks. 123 nonfasting Exercise and diet-  walking 30 minutes 4 days a week last viist-  that has fallen off- we discussed restarting regular exercise Lab Results  Component Value Date   HGBA1C 7.3 (H) 03/14/2017   HGBA1C 6.9 (H) 08/17/2016   HGBA1C 7.1 (H) 02/28/2016   A/P: check a1c today- if above 7 increase metformin to BID  Hyperlipemia S: poorly controlled on atorvastatin 40mg - increased from 20mg  after seeing last LDL 139. No myalgias.  A/P: update LDL today- target LDL under 100  Return in about 4 months (around 11/30/2017).  Orders Placed This Encounter  Procedures  . Hemoglobin A1c    [place]  . LDL  cholesterol, direct    [place]   Return precautions advised.  [name], MD

## 2017-07-30 NOTE — Assessment & Plan Note (Signed)
S: poorly controlled on atorvastatin 40mg - increased from 20mg  after seeing last LDL 139. No myalgias.  A/P: update LDL today- target LDL under 100

## 2017-07-30 NOTE — Assessment & Plan Note (Signed)
S: mild poorly controlled- on metformin once a day. On last check with a1c up to 7.3. Discussed dietary changes- she wanted to be aggressive about lifestyle. Weight down 2 lbs CBGs- 103 at hospital when fasting, no regular checks. 123 nonfasting Exercise and diet-  walking 30 minutes 4 days a week last viist- that has fallen off- we discussed restarting regular exercise Lab Results  Component Value Date   HGBA1C 7.3 (H) 03/14/2017   HGBA1C 6.9 (H) 08/17/2016   HGBA1C 7.1 (H) 02/28/2016   A/P: check a1c today- if above 7 increase metformin to BID

## 2017-11-09 ENCOUNTER — Other Ambulatory Visit: Payer: Self-pay | Admitting: Family Medicine

## 2017-11-13 DIAGNOSIS — E119 Type 2 diabetes mellitus without complications: Secondary | ICD-10-CM | POA: Diagnosis not present

## 2017-11-13 LAB — HM DIABETES EYE EXAM

## 2017-11-30 ENCOUNTER — Encounter: Payer: Self-pay | Admitting: Family Medicine

## 2017-12-03 ENCOUNTER — Ambulatory Visit (INDEPENDENT_AMBULATORY_CARE_PROVIDER_SITE_OTHER): Payer: Managed Care, Other (non HMO) | Admitting: Family Medicine

## 2017-12-03 ENCOUNTER — Encounter: Payer: Self-pay | Admitting: Family Medicine

## 2017-12-03 VITALS — BP 132/76 | HR 75 | Temp 98.5°F | Ht 61.0 in | Wt 159.8 lb

## 2017-12-03 DIAGNOSIS — E119 Type 2 diabetes mellitus without complications: Secondary | ICD-10-CM | POA: Diagnosis not present

## 2017-12-03 DIAGNOSIS — F418 Other specified anxiety disorders: Secondary | ICD-10-CM | POA: Diagnosis not present

## 2017-12-03 DIAGNOSIS — E1159 Type 2 diabetes mellitus with other circulatory complications: Secondary | ICD-10-CM | POA: Diagnosis not present

## 2017-12-03 DIAGNOSIS — I10 Essential (primary) hypertension: Secondary | ICD-10-CM

## 2017-12-03 DIAGNOSIS — E785 Hyperlipidemia, unspecified: Secondary | ICD-10-CM

## 2017-12-03 DIAGNOSIS — I152 Hypertension secondary to endocrine disorders: Secondary | ICD-10-CM

## 2017-12-03 LAB — COMPREHENSIVE METABOLIC PANEL
ALT: 22 U/L (ref 0–35)
AST: 24 U/L (ref 0–37)
Albumin: 4.1 g/dL (ref 3.5–5.2)
Alkaline Phosphatase: 91 U/L (ref 39–117)
BILIRUBIN TOTAL: 0.5 mg/dL (ref 0.2–1.2)
BUN: 14 mg/dL (ref 6–23)
CHLORIDE: 102 meq/L (ref 96–112)
CO2: 30 meq/L (ref 19–32)
CREATININE: 0.71 mg/dL (ref 0.40–1.20)
Calcium: 9.4 mg/dL (ref 8.4–10.5)
GFR: 86.81 mL/min (ref >60.00)
Glucose, Bld: 137 mg/dL — ABNORMAL HIGH (ref 70–99)
Potassium: 3.8 mEq/L (ref 3.5–5.1)
SODIUM: 140 meq/L (ref 135–145)
Total Protein: 7 g/dL (ref 6.0–8.3)

## 2017-12-03 LAB — HEMOGLOBIN A1C: Hgb A1c MFr Bld: 6.7 % — ABNORMAL HIGH (ref 4.6–6.5)

## 2017-12-03 LAB — LDL CHOLESTEROL, DIRECT: LDL DIRECT: 84 mg/dL

## 2017-12-03 NOTE — Assessment & Plan Note (Deleted)
S: controlled on valsartan 80-hctz 12.5mg  (not on recall) BP Readings from Last 3 Encounters:  12/03/17 132/76  07/30/17 132/84  07/18/17 127/67  A/P: We discussed blood pressure goal of <140/90. Continue current meds

## 2017-12-03 NOTE — Progress Notes (Signed)
Subjective:  Candice Guerrero is a 67 y.o. year old very pleasant female patient who presents for/with See problem oriented charting ROS- no hypoglycemia. No chest pain or shortness of breath. No fever or chills.    Past Medical History-  Patient Active Problem List   Diagnosis Date Noted  . Diabetes mellitus type II, controlled, with no complications ([place]) 95/06/3266    Priority: High  . IBS (irritable bowel syndrome) 07/24/2014    Priority: Medium  . Psoriasis 12/13/2010    Priority: Medium  . Hypertension associated with diabetes ([place]) 12/13/2010    Priority: Medium  . Depression with anxiety 12/13/2010    Priority: Medium  . Fatty liver 12/13/2010    Priority: Medium  . Hyperlipemia 12/13/2010    Priority: Medium  . Abnormal mammogram of right breast 07/18/2017    Priority: Low  . Osteopenia 07/24/2014    Priority: Low  . Allergic rhinitis 07/24/2014    Priority: Low  . Obesity (BMI 30-39.9) 12/31/2013    Priority: Low  . Benign hematuria 12/13/2010    Priority: Low  . GERD (gastroesophageal reflux disease) 12/13/2010    Priority: Low    Medications- reviewed and updated Current Outpatient Medications  Medication Sig Dispense Refill  . aspirin 81 MG tablet Take 81 mg by mouth daily.      Marland Kitchen aspirin-acetaminophen-caffeine (EXCEDRIN MIGRAINE) 250-250-65 MG per tablet Take 1 tablet by mouth every 6 (six) hours as needed. Reported on 10/21/2015    . atorvastatin (LIPITOR) 40 MG tablet TAKE 1 TABLET BY MOUTH  DAILY 90 tablet 3  . calcipotriene-betamethasone (TACLONEX) ointment Apply topically daily. 60 g 0  . Calcium Carbonate (CALTRATE 600) 1500 MG TABS Take by mouth daily.      . citalopram (CELEXA) 20 MG tablet TAKE 1 TABLET BY MOUTH  DAILY 90 tablet 3  . metFORMIN (GLUCOPHAGE) 500 MG tablet Take 1 tablet (500 mg total) by mouth 2 (two) times daily with a meal. 180 tablet 3  . mometasone (NASONEX) 50 MCG/ACT nasal spray Place 2 sprays into the nose daily. 17 g 11  .  omeprazole (PRILOSEC) 40 MG capsule TAKE 1 CAPSULE BY MOUTH  DAILY 90 capsule 3  . valsartan-hydrochlorothiazide (DIOVAN-HCT) 80-12.5 MG tablet TAKE 1 TABLET BY MOUTH  DAILY 90 tablet 3  . VITAMIN D, ERGOCALCIFEROL, PO Take by mouth daily.     No current facility-administered medications for this visit.     Objective: BP 132/76 (BP Location: Left Arm, Patient Position: Sitting, Cuff Size: Large)   Pulse 75   Temp 98.5 F (36.9 C) (Oral)   Ht 5\' 1"  (1.549 m)   Wt 159 lb 12.8 oz (72.5 kg)   SpO2 99%   BMI 30.19 kg/m  Gen: NAD, resting comfortably CV: RRR no murmurs rubs or gallops Lungs: CTAB no crackles, wheeze, rhonchi Abdomen: soft/nontender/nondistendedobese Ext: no edema Skin: warm, dry  Assessment/Plan:  Diabetes mellitus type II, controlled, with no complications (HCC) S: mild poorly controlled. On metformin once a day last visit- we increased due to 500mg  BID BID Exercise and diet- good news- she has lost 5 lbs from last visit!!! Has joined [employer] Lab Results  Component Value Date   HGBA1C 7.2 (H) 07/30/2017   HGBA1C 7.3 (H) 03/14/2017   HGBA1C 6.9 (H) 08/17/2016   A/P: will get a1c- if below 6.5- would go back to once a day metformin Lab Results  Component Value Date   HGBA1C 6.7 (H) 12/03/2017  addendum: not quite to goal ,  continue current rx  Hypertension associated with diabetes S: controlled on valsartan 80-hctz 12.5mg  (not on recall) BP Readings from Last 3 Encounters:  12/03/17 132/76  07/30/17 132/84  07/18/17 127/67  A/P: We discussed blood pressure goal of <140/90. Continue current meds  Hyperlipemia S: poorly controlled on atorvastatin 40mg  (LDL was 139 on 20mg  dose and went to 117). No myalgias.  Lab Results  Component Value Date   CHOL 199 08/17/2016   HDL 48.90 08/17/2016   LDLCALC 117 (H) 08/17/2016   LDLDIRECT 114.0 07/30/2017   TRIG 165.0 (H) 08/17/2016   CHOLHDL 4 08/17/2016   A/P: we will push for further lipid improvement  with lifestyle changes as long as LDL under 130. Continue atorvastatin 40mg  Addendum: luckily LDL came back under 100 which is great news- continue current rx  Depression with anxiety Referenced PHQ9 of 0 from October 2018. Patient continues to be without issues as long as she remains on celexa 20mg .    Future Appointments  Date Time Provider [place]  05/06/2018 10:45 AM [name], MD [facility]   Return in about 4 months (around 04/02/2018) for follow up- or sooner if needed. Or 6 months.   Lab/Order associations: Controlled type 2 diabetes mellitus without complication, without long-term current use of insulin ([place]) - Plan: LDL cholesterol, direct, Comprehensive metabolic panel, Hemoglobin A1c  Return precautions advised.  [name], MD

## 2017-12-03 NOTE — Assessment & Plan Note (Signed)
S: mild poorly controlled. On metformin once a day last visit- we increased due to 500mg  BID BID Exercise and diet- good news- she has lost 5 lbs from last visit!!! Has joined [employer] Lab Results  Component Value Date   HGBA1C 7.2 (H) 07/30/2017   HGBA1C 7.3 (H) 03/14/2017   HGBA1C 6.9 (H) 08/17/2016   A/P: will get a1c- if below 6.5- would go back to once a day metformin Lab Results  Component Value Date   HGBA1C 6.7 (H) 12/03/2017  addendum: not quite to goal , continue current rx

## 2017-12-03 NOTE — Assessment & Plan Note (Signed)
S: controlled on valsartan 80-hctz 12.5mg  (not on recall) BP Readings from Last 3 Encounters:  12/03/17 132/76  07/30/17 132/84  07/18/17 127/67  A/P: We discussed blood pressure goal of <140/90. Continue current meds

## 2017-12-03 NOTE — Patient Instructions (Addendum)
Please stop by lab before you go  [place] job on weight loss!!! Keep up the great work.   4-6 months for a physical

## 2017-12-03 NOTE — Assessment & Plan Note (Signed)
Referenced PHQ9 of 0 from October 2018. Patient continues to be without issues as long as she remains on celexa 20mg .

## 2017-12-03 NOTE — Assessment & Plan Note (Signed)
S: poorly controlled on atorvastatin 40mg  (LDL was 139 on 20mg  dose and went to 117). No myalgias.  Lab Results  Component Value Date   CHOL 199 08/17/2016   HDL 48.90 08/17/2016   LDLCALC 117 (H) 08/17/2016   LDLDIRECT 114.0 07/30/2017   TRIG 165.0 (H) 08/17/2016   CHOLHDL 4 08/17/2016   A/P: we will push for further lipid improvement with lifestyle changes as long as LDL under 130. Continue atorvastatin 40mg  Addendum: luckily LDL came back under 100 which is great news- continue current rx

## 2017-12-18 DIAGNOSIS — M8588 Other specified disorders of bone density and structure, other site: Secondary | ICD-10-CM | POA: Diagnosis not present

## 2018-01-07 DIAGNOSIS — M8588 Other specified disorders of bone density and structure, other site: Secondary | ICD-10-CM | POA: Diagnosis not present

## 2018-01-22 DIAGNOSIS — M8580 Other specified disorders of bone density and structure, unspecified site: Secondary | ICD-10-CM | POA: Diagnosis not present

## 2018-02-18 DIAGNOSIS — R220 Localized swelling, mass and lump, head: Secondary | ICD-10-CM | POA: Diagnosis not present

## 2018-02-25 DIAGNOSIS — M19072 Primary osteoarthritis, left ankle and foot: Secondary | ICD-10-CM | POA: Diagnosis not present

## 2018-02-25 DIAGNOSIS — R262 Difficulty in walking, not elsewhere classified: Secondary | ICD-10-CM | POA: Diagnosis not present

## 2018-03-14 ENCOUNTER — Other Ambulatory Visit: Payer: Self-pay | Admitting: Family Medicine

## 2018-03-14 DIAGNOSIS — Z1231 Encounter for screening mammogram for malignant neoplasm of breast: Secondary | ICD-10-CM

## 2018-05-06 ENCOUNTER — Ambulatory Visit: Payer: Medicare Other | Admitting: Family Medicine

## 2018-05-21 ENCOUNTER — Ambulatory Visit: Payer: Medicare Other | Admitting: Family Medicine

## 2018-05-30 ENCOUNTER — Ambulatory Visit
Admission: RE | Admit: 2018-05-30 | Discharge: 2018-05-30 | Disposition: A | Discharge status: Home / Self Care | Payer: Managed Care, Other (non HMO) | Source: Ambulatory Visit | Attending: Family Medicine | Admitting: Family Medicine

## 2018-05-30 DIAGNOSIS — Z1231 Encounter for screening mammogram for malignant neoplasm of breast: Secondary | ICD-10-CM

## 2018-06-19 ENCOUNTER — Encounter: Payer: Self-pay | Admitting: Family Medicine

## 2018-06-22 ENCOUNTER — Encounter: Payer: Self-pay | Admitting: Family Medicine

## 2018-06-24 ENCOUNTER — Other Ambulatory Visit: Payer: Self-pay

## 2018-06-24 MED ORDER — CITALOPRAM HYDROBROMIDE 20 MG PO TABS: 20.0000 mg | ORAL_TABLET | Freq: Every day | ORAL | 3 refills | Status: DC

## 2018-06-24 MED ORDER — MOMETASONE FUROATE 50 MCG/ACT NA SUSP: 2.0000 | Freq: Every day | NASAL | 11 refills | Status: DC

## 2018-06-24 MED ORDER — METFORMIN HCL 500 MG PO TABS: 500.0000 mg | ORAL_TABLET | Freq: Two times a day (BID) | ORAL | 3 refills | Status: DC

## 2018-06-24 MED ORDER — ATORVASTATIN CALCIUM 40 MG PO TABS: 40.0000 mg | ORAL_TABLET | Freq: Every day | ORAL | 3 refills | Status: DC

## 2018-06-24 MED ORDER — OMEPRAZOLE 40 MG PO CPDR: 40.0000 mg | DELAYED_RELEASE_CAPSULE | Freq: Every day | ORAL | 3 refills | Status: DC

## 2018-06-24 MED ORDER — VALSARTAN-HYDROCHLOROTHIAZIDE 80-12.5 MG PO TABS: 1.0000 | ORAL_TABLET | Freq: Every day | ORAL | 3 refills | Status: DC

## 2018-07-23 ENCOUNTER — Ambulatory Visit (INDEPENDENT_AMBULATORY_CARE_PROVIDER_SITE_OTHER): Payer: Medicare HMO | Admitting: Family Medicine

## 2018-07-23 VITALS — BP 124/68 | HR 71 | Temp 98.6°F | Ht 61.0 in | Wt 162.0 lb

## 2018-07-23 DIAGNOSIS — E1169 Type 2 diabetes mellitus with other specified complication: Secondary | ICD-10-CM | POA: Diagnosis not present

## 2018-07-23 DIAGNOSIS — E119 Type 2 diabetes mellitus without complications: Secondary | ICD-10-CM

## 2018-07-23 DIAGNOSIS — I10 Essential (primary) hypertension: Secondary | ICD-10-CM | POA: Diagnosis not present

## 2018-07-23 DIAGNOSIS — Z23 Encounter for immunization: Secondary | ICD-10-CM | POA: Diagnosis not present

## 2018-07-23 DIAGNOSIS — E1159 Type 2 diabetes mellitus with other circulatory complications: Secondary | ICD-10-CM

## 2018-07-23 DIAGNOSIS — E785 Hyperlipidemia, unspecified: Secondary | ICD-10-CM

## 2018-07-23 DIAGNOSIS — I152 Hypertension secondary to endocrine disorders: Secondary | ICD-10-CM

## 2018-07-23 LAB — POCT GLYCOSYLATED HEMOGLOBIN (HGB A1C): HEMOGLOBIN A1C: 6.5 % — AB (ref 4.0–5.6)

## 2018-07-23 NOTE — Patient Instructions (Addendum)
Health Maintenance Due  Topic Date Due  . FOOT EXAM-completed today  03/14/2018  . INFLUENZA VACCINE -administered today 05/16/2018  . HEMOGLOBIN A1C -ordered today 06/02/2018   I would also like for you to sign up for an annual wellness visit with one of our nurses, Cassie or Manuela Schwartz, who both specialize in the annual wellness visit. This is a free benefit under medicare that may help [place] find additional ways to help you. Some highlights are reviewing medications, lifestyle, and doing a dementia screen.   I want to know if you are getting any sugars under 70 after exercise. I am considering half or no tablet of metformin if you know you are going exercising.

## 2018-07-23 NOTE — Progress Notes (Signed)
Subjective:  Candice Guerrero is a 67 y.o. year old very pleasant female patient who presents for/with See problem oriented charting ROS- No chest pain or shortness of breath. No hypoglycemia or blurry vision.  Occasional headache if sinuses bother her.   Past Medical History-  Patient Active Problem List   Diagnosis Date Noted  . Diabetes mellitus type II, controlled, with no complications ([place]) 93/81/0175    Priority: High  . IBS (irritable bowel syndrome) 07/24/2014    Priority: Medium  . Psoriasis 12/13/2010    Priority: Medium  . Hypertension associated with diabetes ([place]) 12/13/2010    Priority: Medium  . Depression with anxiety 12/13/2010    Priority: Medium  . Fatty liver 12/13/2010    Priority: Medium  . Hyperlipidemia associated with type 2 diabetes mellitus ([place]) 12/13/2010    Priority: Medium  . Abnormal mammogram of right breast 07/18/2017    Priority: Low  . Osteopenia 07/24/2014    Priority: Low  . Allergic rhinitis 07/24/2014    Priority: Low  . Obesity (BMI 30-39.9) 12/31/2013    Priority: Low  . Benign hematuria 12/13/2010    Priority: Low  . GERD (gastroesophageal reflux disease) 12/13/2010    Priority: Low    Medications- reviewed and updated Current Outpatient Medications  Medication Sig Dispense Refill  . aspirin 81 MG tablet Take 81 mg by mouth daily.      Marland Kitchen aspirin-acetaminophen-caffeine (EXCEDRIN MIGRAINE) 250-250-65 MG per tablet Take 1 tablet by mouth every 6 (six) hours as needed. Reported on 10/21/2015    . atorvastatin (LIPITOR) 40 MG tablet Take 1 tablet (40 mg total) by mouth daily. 90 tablet 3  . calcipotriene-betamethasone (TACLONEX) ointment Apply topically daily. 60 g 0  . Calcium Carbonate (CALTRATE 600) 1500 MG TABS Take by mouth daily.      . citalopram (CELEXA) 20 MG tablet Take 1 tablet (20 mg total) by mouth daily. 90 tablet 3  . metFORMIN (GLUCOPHAGE) 500 MG tablet Take 1 tablet (500 mg total) by mouth 2 (two) times daily with a meal.  180 tablet 3  . mometasone (NASONEX) 50 MCG/ACT nasal spray Place 2 sprays into the nose daily. 17 g 11  . omeprazole (PRILOSEC) 40 MG capsule Take 1 capsule (40 mg total) by mouth daily. 90 capsule 3  . valsartan-hydrochlorothiazide (DIOVAN-HCT) 80-12.5 MG tablet Take 1 tablet by mouth daily. 90 tablet 3  . VITAMIN D, ERGOCALCIFEROL, PO Take by mouth daily.     No current facility-administered medications for this visit.     Objective: BP 124/68 (BP Location: Left Arm, Patient Position: Sitting, Cuff Size: Normal)   Pulse 71   Temp 98.6 F (37 C) (Oral)   Ht 5\' 1"  (1.549 m)   Wt 162 lb (73.5 kg)   LMP  (LMP Unknown)   SpO2 99%   BMI 30.61 kg/m  Gen: NAD, resting comfortably CV: RRR no murmurs rubs or gallops Lungs: CTAB no crackles, wheeze, rhonchi Abdomen: soft/nontender/nondistended Ext: no edema Skin: warm, dry Neuro: grossly normal, moves all extremities  Diabetic Foot Exam - Simple   Simple Foot Form Diabetic Foot exam was performed with the following findings:  Yes 07/23/2018 11:55 AM  Visual Inspection No deformities, no ulcerations, no other skin breakdown bilaterally:  Yes Sensation Testing Intact to touch and monofilament testing bilaterally:  Yes Pulse Check Posterior Tibialis and Dorsalis pulse intact bilaterally:  Yes Comments      Assessment/Plan:  Hyperlipidemia S:  controlled on atorvastatin 40mg  with last LDL  at 7.  A/P: continue current rx  Diabetes mellitus type II, controlled, with no complications ([place]) S:  controlled on metformin 500mg  BID Exercise and diet- tweaked knee and has not been able to make it to gym as much- up 3 lbs after losign 5 lbs last visit.  Lab Results  Component Value Date   HGBA1C 6.7 (H) 12/03/2017   HGBA1C 7.2 (H) 07/30/2017   HGBA1C 7.3 (H) 03/14/2017   A/P: update a1c today, hopeful can maintain current regimen. Preference to increase exercise over increasing rx. Possible low blood sugar after the gym- does eat  before this but feels a little shaky when she gets home- will give her a meter  Hypertension associated with diabetes S: controlled on valsartan 80-hctz 12.5mg  BP Readings from Last 3 Encounters:  07/23/18 124/68  12/03/17 132/76  07/30/17 132/84  A/P: We discussed blood pressure goal of <140/90. Continue current meds  Future Appointments  Date Time Provider [place]  01/22/2019  8:00 AM [name], MD [facility]   Return in about 6 months (around 01/22/2019) for physical.  Lab/Order associations: Need for prophylactic vaccination and inoculation against influenza - Plan: Flu vaccine HIGH DOSE PF (Fluzone High dose)  Controlled type 2 diabetes mellitus without complication, without long-term current use of insulin ([place]) - Plan: POCT glycosylated hemoglobin (Hb A1C)  Hypertension associated with diabetes ([place])  Return precautions advised.  [name], MD

## 2018-07-24 ENCOUNTER — Encounter: Payer: Self-pay | Admitting: Family Medicine

## 2018-07-24 NOTE — Assessment & Plan Note (Signed)
S: controlled on valsartan 80-hctz 12.5mg  BP Readings from Last 3 Encounters:  07/23/18 124/68  12/03/17 132/76  07/30/17 132/84  A/P: We discussed blood pressure goal of <140/90. Continue current meds

## 2018-07-24 NOTE — Assessment & Plan Note (Signed)
S:  controlled on metformin 500mg  BID Exercise and diet- tweaked knee and has not been able to make it to gym as much- up 3 lbs after losign 5 lbs last visit.  Lab Results  Component Value Date   HGBA1C 6.7 (H) 12/03/2017   HGBA1C 7.2 (H) 07/30/2017   HGBA1C 7.3 (H) 03/14/2017   A/P: update a1c today, hopeful can maintain current regimen. Preference to increase exercise over increasing rx. Possible low blood sugar after the gym- does eat before this but feels a little shaky when she gets home- will give her a meter

## 2018-08-06 DIAGNOSIS — R69 Illness, unspecified: Secondary | ICD-10-CM | POA: Diagnosis not present

## 2018-08-29 DIAGNOSIS — L821 Other seborrheic keratosis: Secondary | ICD-10-CM | POA: Diagnosis not present

## 2018-08-29 DIAGNOSIS — L304 Erythema intertrigo: Secondary | ICD-10-CM | POA: Diagnosis not present

## 2018-08-29 DIAGNOSIS — Z23 Encounter for immunization: Secondary | ICD-10-CM | POA: Diagnosis not present

## 2018-08-29 DIAGNOSIS — D0359 Melanoma in situ of other part of trunk: Secondary | ICD-10-CM | POA: Diagnosis not present

## 2018-08-29 DIAGNOSIS — D485 Neoplasm of uncertain behavior of skin: Secondary | ICD-10-CM | POA: Diagnosis not present

## 2018-08-29 DIAGNOSIS — L814 Other melanin hyperpigmentation: Secondary | ICD-10-CM | POA: Diagnosis not present

## 2018-08-29 DIAGNOSIS — L400 Psoriasis vulgaris: Secondary | ICD-10-CM | POA: Diagnosis not present

## 2018-08-29 DIAGNOSIS — D225 Melanocytic nevi of trunk: Secondary | ICD-10-CM | POA: Diagnosis not present

## 2018-08-29 DIAGNOSIS — D2272 Melanocytic nevi of left lower limb, including hip: Secondary | ICD-10-CM | POA: Diagnosis not present

## 2018-09-10 DIAGNOSIS — N9412 Deep dyspareunia: Secondary | ICD-10-CM | POA: Diagnosis not present

## 2018-09-10 DIAGNOSIS — Z01419 Encounter for gynecological examination (general) (routine) without abnormal findings: Secondary | ICD-10-CM | POA: Diagnosis not present

## 2018-10-17 DIAGNOSIS — D0359 Melanoma in situ of other part of trunk: Secondary | ICD-10-CM | POA: Diagnosis not present

## 2018-10-17 DIAGNOSIS — L905 Scar conditions and fibrosis of skin: Secondary | ICD-10-CM | POA: Diagnosis not present

## 2018-11-05 ENCOUNTER — Encounter: Payer: Self-pay | Admitting: Family Medicine

## 2018-11-06 ENCOUNTER — Ambulatory Visit (INDEPENDENT_AMBULATORY_CARE_PROVIDER_SITE_OTHER): Payer: Medicare HMO

## 2018-11-06 ENCOUNTER — Ambulatory Visit (INDEPENDENT_AMBULATORY_CARE_PROVIDER_SITE_OTHER): Payer: Medicare HMO | Admitting: Sports Medicine

## 2018-11-06 ENCOUNTER — Encounter: Payer: Self-pay | Admitting: Sports Medicine

## 2018-11-06 VITALS — BP 124/72 | HR 96 | Ht 61.0 in | Wt 160.2 lb

## 2018-11-06 DIAGNOSIS — M48061 Spinal stenosis, lumbar region without neurogenic claudication: Secondary | ICD-10-CM | POA: Diagnosis not present

## 2018-11-06 DIAGNOSIS — M9903 Segmental and somatic dysfunction of lumbar region: Secondary | ICD-10-CM

## 2018-11-06 DIAGNOSIS — E119 Type 2 diabetes mellitus without complications: Secondary | ICD-10-CM | POA: Diagnosis not present

## 2018-11-06 DIAGNOSIS — M5442 Lumbago with sciatica, left side: Secondary | ICD-10-CM

## 2018-11-06 DIAGNOSIS — M9905 Segmental and somatic dysfunction of pelvic region: Secondary | ICD-10-CM | POA: Diagnosis not present

## 2018-11-06 MED ORDER — METHYLPREDNISOLONE 4 MG PO TBPK: ORAL_TABLET | ORAL | 0 refills | Status: DC

## 2018-11-06 MED ORDER — GABAPENTIN 300 MG PO CAPS: ORAL_CAPSULE | ORAL | 1 refills | Status: DC

## 2018-11-06 NOTE — Progress Notes (Signed)
Candice Guerrero. Candice Guerrero, [place] at [place]  Candice Guerrero - 68 y.o. female MRN [id number]  Date of birth: 03-Jan-1951  Visit Date: November 06, 2018  PCP: [name], MD   Referred by: [name], MD  SUBJECTIVE:  Chief Complaint  Patient presents with  . New Patient (Initial Visit)    Radiating L leg pain    HPI: Patient presents with the above symptoms.  She reports burning leg pain in the left leg that is radiating from her lower back.  Describes it as a burning type pain with a nudge in her back.  There is numbness that is radiating from her buttock to her calf.  It is worse with sitting and standing and lying.  Pain does not seem to go to her foot at this time but did at one point.  She is used ice heat and Motrin with only moderate improvement.    REVIEW OF SYSTEMS: Reports night time disturbances.  Denies fevers, chills, or night sweats. Denies unexplained weight loss. Denies personal history of cancer. Denies changes in bowel or bladder habits. Denies recent unreported falls. Denies new or worsening dyspnea or wheezing. Denies headaches or dizziness.  Reports, Slight left lower extremity numbness, tingling or weakness In the extremities.  Denies dizziness or presyncopal episodes Denies lower extremity edema   HISTORY:  Prior history reviewed and updated per electronic medical record.  Social History   Occupational History  . Not on file  Tobacco Use  . Smoking status: Former Smoker    Packs/day: 0.10    Years: 20.00    Pack years: 2.00    Types: Cigarettes    Last attempt to quit: 10/16/1998    Years since quitting: 20.0  . Smokeless tobacco: Never Used  . Tobacco comment: quit smoking in 2000  Substance and Sexual Activity  . Alcohol use: Yes    Comment: socially, weekends  . Drug use: No  . Sexual activity: Yes   Social History   Social History Narrative   Moved to [place] in 2011 from [place] due to more affordable.       Married 1972 Mortimer Fries). 2 kids. 5 grandkids. 2 in [place], 3 [place].       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       Hobbies: babysitting, casino   Past Medical History:  Diagnosis Date  . Abnormal mammogram of right breast 07/18/2017  . Allergy    SEASONAL  . Benign hematuria   . Cataract    LEFT EYE  . Dental crowns present   . Depression   . Diabetes mellitus    NIDDM  . GERD (gastroesophageal reflux disease)   . Headache(784.0)    migraines, sinus  . Hiatal hernia   . Hyperlipidemia   . Hypertension    under control, has been on med. since 2005  . Osteopenia   . Psoriasis    arms, legs, back  . Snores    denies sx. of sleep apnea   Past Surgical History:  Procedure Laterality Date  . BREAST LUMPECTOMY WITH RADIOACTIVE SEED LOCALIZATION Right 07/18/2017   Procedure: RIGHT BREAST LUMPECTOMY WITH RADIOACTIVE SEED LOCALIZATION;  Surgeon: Fanny Skates, MD;  Location: [facility];  Service: General;  Laterality: Right;  . COLONOSCOPY    . DILATION AND CURETTAGE OF UTERUS    . HARDWARE REMOVAL  07/11/2012  Procedure: HARDWARE REMOVAL;  Surgeon: Cammie Sickle., MD;  Location: [facility];  Service: Orthopedics;  Laterality: Left;  pin removal wire out and debride pisiform  . left wrist surgery     July 9th 2013  . TONSILLECTOMY    . TOOTH EXTRACTION Right 12/13/2016   Procedure: SURGICAL REMOVAL OF ODONTOGENIC TUMOR AND REMOVAL OF IMPACTED TOOTH #1;  Surgeon: Jannette Fogo, DDS;  Location: [facility];  Service: Oral Surgery;  Laterality: Right;  . TRIGGER FINGER RELEASE  07/11/2012   Procedure: RELEASE TRIGGER FINGER/A-1 PULLEY;  Surgeon: Cammie Sickle., MD;  Location: [facility];  Service: Orthopedics;  Laterality: Left;  LEFT LONG FINGER   family history includes Anesthesia problems in her sister; Diabetes in her maternal uncle;  Hyperlipidemia in her mother; Hypertension in her father and mother; Kidney disease in her father.  DATA OBTAINED & REVIEWED:  Recent Labs    12/03/17 1105 07/23/18 1205  HGBA1C 6.7* 6.5*  CALCIUM 9.4  --   AST 24  --   ALT 22  --    No problems updated. No specialty comments available. X-rays reviewed with the patient today that show significant narrowing at L5-S1 disc.  There is facet arthropathy at L3-4 and L5-S1.  OBJECTIVE:  VS:  HT:5\' 1"  (154.9 cm)   WT:160 lb 3.2 oz (72.7 kg)  BMI:30.29    BP:124/72  HR:96bpm  TEMP: ( )  RESP:98 %   PHYSICAL EXAM: CONSTITUTIONAL: Well-developed, Well-nourished and In no acute distress EYES: Pupils are equal., EOM intact without nystagmus. and No scleral icterus. Psychiatric: Alert & appropriately interactive. and Not depressed or anxious appearing. EXTREMITY EXAM: Warm and well perfused  Back & Lower Extremities:  Tightness but no radicular symptoms with straight leg raise on the left.  No significant midline tenderness.    Bilateral paraspinal muscle spasms with no significant midline tenderness.  Good internal and external rotation of the hips.  Patient is able to heel and toe walk without significant difficulty.  Full range of motion without pain, no tenderness, no spasm, no curvature. Normal reflexes, gait, strength and negative straight-leg raise.   ASSESSMENT  1. Acute midline low back pain with left-sided sciatica   2. Somatic dysfunction of pelvis region   3. Somatic dysfunction of lumbar region   4. Controlled type 2 diabetes mellitus without complication, without long-term current use of insulin ([place])     PROCEDURES:  PROCEDURE NOTE: OSTEOPATHIC MANIPULATION  The decision today to treat with Osteopathic Manipulative Therapy (OMT) was based on physical exam findings. Verbal consent was obtained following a discussion with the patient regarding the of risks, benefits and potential side effects, including an acute  pain flare,post manipulation soreness and need for repeat treatments.   Contraindications to OMT: NONE Manipulation was performed as below: [facility] Treated & Osteopathic Exam Findings LUMBAR SPINE:  L5 FRS LEFT (Flexed, Rotated & Sidebent) PELVIS:  Left psoas spasm Left anterior innonimate  OMT Techniques Used: HVLA muscle energy myofascial release  The patient tolerated the treatment well and reported Improved symptoms following treatment today. Patient was given medications, exercises, stretches and lifestyle modifications per AVS and verbally.      PLAN:  Pertinent additional documentation may be included in corresponding procedure notes, imaging studies, problem based documentation and patient instructions.  No problem-specific Assessment & Plan notes found for this encounter.   Underlying degenerative changes consistent with psoas syndrome. Should respond well to conservative measures including osteopathic  manipulation, gabapentin and Medrol Dosepak.  Long lever OMT performed today with good improvement.  Discussed the underlying features of tight hip flexors leading to crouched, fetal like position that results in spinal column compression.  Including lumbar hyperflexion with hypermobility, thoracic flexion with restrictive rotation and cervical lordosis reversal.   Activity modifications and the importance of avoiding exacerbating activities (limiting pain to no more than a 4 / 10 during or following activity) recommended and discussed. Discussed red flag symptoms that warrant earlier emergent evaluation and patient voices understanding. Meds ordered this encounter  Medications  . gabapentin (NEURONTIN) 300 MG capsule    Sig: Start with 1 tab po qhs X 1 week, then increase to 1 tab po bid X 1 week then 1 tab po tid prn    Dispense:  90 capsule    Refill:  1  . methylPREDNISolone (MEDROL DOSEPAK) 4 MG TBPK tablet    Sig: Take by mouth as directed. Take 6 tablets on the  first day prescribed then as directed.    Dispense:  21 tablet    Refill:  0   Lab Orders  No laboratory test(s) ordered today    Imaging Orders     DG Lumbar Spine Complete Referral Orders  No referral(s) requested today    At follow up will plan: repeat osteopathic manipulation and Initiation of HEP. Return in about 1 week (around 11/13/2018).          Gerda Diss, [facility] Physician

## 2018-11-07 ENCOUNTER — Ambulatory Visit: Payer: Medicare HMO | Admitting: Sports Medicine

## 2018-11-10 ENCOUNTER — Encounter: Payer: Self-pay | Admitting: Sports Medicine

## 2018-11-11 ENCOUNTER — Encounter: Payer: Self-pay | Admitting: Sports Medicine

## 2018-11-11 ENCOUNTER — Ambulatory Visit (INDEPENDENT_AMBULATORY_CARE_PROVIDER_SITE_OTHER): Payer: Medicare HMO | Admitting: Sports Medicine

## 2018-11-11 VITALS — BP 128/86 | HR 61 | Ht 61.0 in | Wt 159.2 lb

## 2018-11-11 DIAGNOSIS — M9903 Segmental and somatic dysfunction of lumbar region: Secondary | ICD-10-CM

## 2018-11-11 DIAGNOSIS — M47816 Spondylosis without myelopathy or radiculopathy, lumbar region: Secondary | ICD-10-CM

## 2018-11-11 DIAGNOSIS — M9904 Segmental and somatic dysfunction of sacral region: Secondary | ICD-10-CM

## 2018-11-11 DIAGNOSIS — G5702 Lesion of sciatic nerve, left lower limb: Secondary | ICD-10-CM | POA: Diagnosis not present

## 2018-11-11 DIAGNOSIS — M9905 Segmental and somatic dysfunction of pelvic region: Secondary | ICD-10-CM | POA: Diagnosis not present

## 2018-11-11 DIAGNOSIS — M5442 Lumbago with sciatica, left side: Secondary | ICD-10-CM

## 2018-11-11 NOTE — Patient Instructions (Signed)
Also check out [employer]" which is a program developed by Dr. Minerva Ends.   There are links to a couple of his YouTube Videos below and I would like to see you performing one of his videos 5-6 days per week.  It is best to do these exercises first thing in the morning.  They will give you a good jumpstart here today and start normalizing the way you move.  A good intro video is: "Independence from Pain 7-minute Video" - [email or web address]   A more advanced video is: Interior and spatial designer original 12 minutes" - [email or web address]  Exercises that focus more on the neck are as below: Dr. Archie Balboa with [facility] teaching neck and shoulder details Part 1 - https://youtu.be/[email or web address] Part 2 Dr. Archie Balboa with [facility] quick routine to practice daily - https://youtu.be/[email or web address]  Do not try to attempt the entire video when first beginning.  Try breaking of each exercise that he goes into shorter segments.  In other words, if they perform an exercise for 45 seconds, start with 15 seconds and rest and then resume when they begin the new activity.  If you work your way up to being able to do these videos without having to stop, I expect you will see significant improvements in your pain.  If you enjoy his videos and would like to find out more you can look on his website: [email or web address]  He has a workout streaming option as well as a DVD set available for purchase.  Amazon has the best price for his DVDs.     Please perform the exercise program that we have prepared for you and gone over in detail on a daily basis.  In addition to the handout you were provided you can access your program through: [email or web address]   Your unique program code is:  [email or web address]

## 2018-11-11 NOTE — Progress Notes (Signed)
Candice Guerrero. Candice Guerrero, Candice Guerrero at [place]  Candice Guerrero - 68 y.o. female MRN [id number]  Date of birth: 1951-07-13  Visit Date: November 11, 2018  PCP: [name], MD   Referred by: [name], MD  SUBJECTIVE:  Chief Complaint  Patient presents with  . Lower Back - Follow-up    HPI: Patient is here for 1 week follow-up of left posterior leg pain with mainly gluteal/buttock pain radiating to the posterior calf.  She does feel better after stretching is been has been helping with this which has been the most beneficial thing.  She is taking the gabapentin since the steroid Dosepak with reported 55% improvement overall.  No other significant changes including any changes in bowel or bladder habits, muscle weakness, numbness or falls associated with this pain.  She responded well to osteopathic manipulation and stretching at last visit.  REVIEW OF SYSTEMS: Per HPI    HISTORY:  Prior history reviewed and updated per electronic medical record.  Social History   Occupational History  . Not on file  Tobacco Use  . Smoking status: Former Smoker    Packs/day: 0.10    Years: 20.00    Pack years: 2.00    Types: Cigarettes    Last attempt to quit: 10/16/1998    Years since quitting: 20.0  . Smokeless tobacco: Never Used  . Tobacco comment: quit smoking in 2000  Substance and Sexual Activity  . Alcohol use: Yes    Comment: socially, weekends  . Drug use: No  . Sexual activity: Yes   Social History   Social History Narrative   Moved to [place] in 2011 from [place] due to more affordable.       Married 1972 Mortimer Fries). 2 kids. 5 grandkids. 2 in [place], 3 [place].       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       Hobbies: babysitting, casino    OBJECTIVE:  VS:  HT:5\' 1"  (154.9 cm)   WT:159 lb 3.2 oz (72.2 kg)  BMI:30.1    BP:128/86  HR:61bpm  TEMP: ( )  RESP:98 %   PHYSICAL EXAM: Adult female.  Bilateral lower extremities are overall well aligned. She is able to heel and toe walk without difficulty. Bilateral lower extremity reflexes trace diffusely. Lower extremity sensation intact light touch. Good internal and external rotation of bilateral hips with functional limitations on the left that do improve with osteopathic manipulation. She has negative straight leg raise bilaterally Negative for nadir and Faber testing.  Pseudo-short left leg corrects with sitting and OMT.   ASSESSMENT  No diagnosis found.   PROCEDURES:  PROCEDURE NOTE: THERAPEUTIC EXERCISES (78295)   Discussed the foundation of treatment for this condition is physical therapy and/or daily (5-6 days/week) therapeutic exercises, focusing on core strengthening, coordination, neuromuscular control/reeducation. 15 minutes spent for Therapeutic exercises as below and as referenced in the AVS. This included exercises focusing on stretching, strengthening, with significant focus on eccentric aspects.  Proper technique shown and discussed handout in great detail with ATC. All questions were discussed and answered.   Long term goals include an improvement in range of motion, strength, endurance as well as avoiding reinjury. Frequency of visits is one time as determined during today's office visit. Frequency of exercises to be performed is as per handout.  EXERCISES REVIEWED:   Archie Balboa Exercises Pelvic recruitment/tilting Hip ABduction strengthening with focus on  Glute Medius Recruitment   PROCEDURE NOTE: OSTEOPATHIC MANIPULATION   The decision today to treat with Osteopathic Manipulative Therapy (OMT) was based on physical exam findings. Verbal consent was obtained following a discussion with the patient regarding the of risks, benefits and potential side effects, including an acute pain flare,post manipulation soreness and need for repeat treatments.  NONE  Manipulation was performed as  below:  [facility] Treated & Osteopathic Exam Findings   LUMBAR SPINE:   L4 ERS LEFT (Extended, Rotated & Sidebent) PELVIS:    Left psoas spasm Left anterior innonimate SACRUM:   R on R sacral torsion    OMT Techniques Used:   HVLA muscle energy myofascial release    The patient tolerated the treatment well and reported Improved symptoms following treatment today. Patient was given medications, exercises, stretches and lifestyle modifications per AVS and verbally.       PLAN:  Pertinent additional documentation may be included in corresponding procedure notes, imaging studies, problem based documentation and patient instructions.  She has reportedly 55% better than the last visit.  Will have her continue with the gabapentin nightly and okay to continue with the q. a.m. dose as needed.  Referral to physical therapy placed today as well as basic home exercises reviewed today with her.  Ultimately she may benefit from dry needling as she is interested in this.  Links to [employer] provided today per Patient Instructions.  These exercises were developed by Minerva Ends, DC with a strong emphasis on core neuromuscular reducation and postural realignment through body-weight exercises.  , Home Therapeutic exercises prescribed today per procedure note. and Referral to physical therapy placed today Discussed the underlying features of tight hip flexors leading to crouched, fetal like position that results in spinal column compression.  Including lumbar hyperflexion with hypermobility, thoracic flexion with restrictive rotation and cervical lordosis reversal.    Activity modifications and the importance of avoiding exacerbating activities (limiting pain to no more than a 4 / 10 during or following activity) recommended and discussed.   Discussed red flag symptoms that warrant earlier emergent evaluation and patient voices understanding.    No orders of the defined types were  placed in this encounter. Lab Orders  No laboratory test(s) ordered today   Imaging Orders  No imaging studies ordered today   Referral Orders  No referral(s) requested today    At follow up will plan to consider : initial osteopathic manipulation  No follow-ups on file.          Gerda Diss, [facility] Physician

## 2018-11-12 ENCOUNTER — Ambulatory Visit: Payer: Medicare HMO | Admitting: Sports Medicine

## 2018-11-15 ENCOUNTER — Encounter: Payer: Self-pay | Admitting: Sports Medicine

## 2018-11-15 MED ORDER — CYCLOBENZAPRINE HCL 10 MG PO TABS: 10.0000 mg | ORAL_TABLET | Freq: Three times a day (TID) | ORAL | 0 refills | Status: DC | PRN

## 2018-11-17 ENCOUNTER — Encounter: Payer: Self-pay | Admitting: Sports Medicine

## 2018-11-17 DIAGNOSIS — M5442 Lumbago with sciatica, left side: Secondary | ICD-10-CM

## 2018-11-18 ENCOUNTER — Telehealth: Payer: Self-pay | Admitting: Sports Medicine

## 2018-11-18 MED ORDER — TRAMADOL HCL 50 MG PO TABS: 50.0000 mg | ORAL_TABLET | Freq: Four times a day (QID) | ORAL | 0 refills | Status: AC | PRN

## 2018-11-18 NOTE — Telephone Encounter (Signed)
I called patient to make sure that she had the phone number for [facility].  I explained that it can sometimes take them a week to call, so it would be easier for her to call and set up the appt.  She was thankful for all of our help.  She mentioned that this pain is interfering with her daily activities and that her pain, mostly at night, can get as high as a 9.  I told her I would call and see about getting authorization and advised her to wait a few days before calling to set up her MRI.

## 2018-11-18 NOTE — Telephone Encounter (Signed)
Prescription for tramadol and an MRI ordered for red flag symptoms with radiculopathy.

## 2018-11-21 ENCOUNTER — Encounter: Payer: Self-pay | Admitting: Sports Medicine

## 2018-11-21 ENCOUNTER — Other Ambulatory Visit: Payer: Self-pay

## 2018-11-21 ENCOUNTER — Telehealth (INDEPENDENT_AMBULATORY_CARE_PROVIDER_SITE_OTHER): Payer: Self-pay | Admitting: *Deleted

## 2018-11-21 ENCOUNTER — Ambulatory Visit
Admission: RE | Admit: 2018-11-21 | Discharge: 2018-11-21 | Disposition: A | Discharge status: Home / Self Care | Payer: Medicare HMO | Source: Ambulatory Visit | Attending: Sports Medicine | Admitting: Sports Medicine

## 2018-11-21 DIAGNOSIS — M5442 Lumbago with sciatica, left side: Secondary | ICD-10-CM

## 2018-11-21 DIAGNOSIS — M48061 Spinal stenosis, lumbar region without neurogenic claudication: Secondary | ICD-10-CM | POA: Diagnosis not present

## 2018-11-21 DIAGNOSIS — M47816 Spondylosis without myelopathy or radiculopathy, lumbar region: Secondary | ICD-10-CM

## 2018-11-21 NOTE — Progress Notes (Signed)
Significant findings likely causing the pain.  We will offer referral to Dr. Ernestina Patches for epidural steroid injection or if she would like to discuss this further prior I am happy to have her come and go over these in person.  Otherwise I like to see her back in 8 weeks and check on her progress at that time.

## 2018-11-23 ENCOUNTER — Other Ambulatory Visit: Payer: Medicare HMO

## 2018-11-25 ENCOUNTER — Other Ambulatory Visit: Payer: Self-pay | Admitting: Physical Therapy

## 2018-11-25 ENCOUNTER — Encounter: Payer: Self-pay | Admitting: Sports Medicine

## 2018-11-25 DIAGNOSIS — M5442 Lumbago with sciatica, left side: Secondary | ICD-10-CM

## 2018-11-26 MED ORDER — TRAMADOL HCL 50 MG PO TABS: 50.0000 mg | ORAL_TABLET | Freq: Four times a day (QID) | ORAL | 0 refills | Status: DC | PRN

## 2018-11-26 MED ORDER — DIAZEPAM 5 MG PO TABS: 5.0000 mg | ORAL_TABLET | Freq: Once | ORAL | 0 refills | Status: AC

## 2018-11-27 ENCOUNTER — Other Ambulatory Visit (INDEPENDENT_AMBULATORY_CARE_PROVIDER_SITE_OTHER): Payer: Self-pay | Admitting: Physical Medicine and Rehabilitation

## 2018-11-27 ENCOUNTER — Telehealth (INDEPENDENT_AMBULATORY_CARE_PROVIDER_SITE_OTHER): Payer: Self-pay | Admitting: *Deleted

## 2018-11-27 DIAGNOSIS — F411 Generalized anxiety disorder: Secondary | ICD-10-CM

## 2018-11-27 MED ORDER — DIAZEPAM 5 MG PO TABS: ORAL_TABLET | ORAL | 0 refills | Status: DC

## 2018-11-27 NOTE — Progress Notes (Signed)
Pre-procedure diazepam ordered for pre-operative anxiety.  

## 2018-11-27 NOTE — Telephone Encounter (Signed)
Pt canceled appt she is having injection at [facility].

## 2018-11-27 NOTE — Telephone Encounter (Signed)
Pt canceled appt she is having injection at [facility].

## 2018-11-27 NOTE — Telephone Encounter (Signed)
done

## 2018-12-02 ENCOUNTER — Ambulatory Visit
Admission: RE | Admit: 2018-12-02 | Discharge: 2018-12-02 | Disposition: A | Discharge status: Home / Self Care | Payer: Medicare HMO | Source: Ambulatory Visit | Attending: Sports Medicine | Admitting: Sports Medicine

## 2018-12-02 DIAGNOSIS — M5116 Intervertebral disc disorders with radiculopathy, lumbar region: Secondary | ICD-10-CM | POA: Diagnosis not present

## 2018-12-02 DIAGNOSIS — M5442 Lumbago with sciatica, left side: Secondary | ICD-10-CM

## 2018-12-02 MED ORDER — METHYLPREDNISOLONE ACETATE 40 MG/ML INJ SUSP (RADIOLOG
120.0000 mg | Freq: Once | INTRAMUSCULAR | Status: AC
  Administered 2018-12-02: 120 mg via EPIDURAL

## 2018-12-02 MED ORDER — IOPAMIDOL (ISOVUE-M 200) INJECTION 41%
1.0000 mL | Freq: Once | INTRAMUSCULAR | Status: AC
  Administered 2018-12-02: 1 mL via EPIDURAL

## 2018-12-02 NOTE — Discharge Instructions (Signed)

## 2018-12-03 ENCOUNTER — Other Ambulatory Visit: Payer: Medicare HMO

## 2018-12-09 ENCOUNTER — Ambulatory Visit: Payer: Medicare HMO | Admitting: Sports Medicine

## 2018-12-10 ENCOUNTER — Encounter

## 2018-12-10 ENCOUNTER — Encounter (INDEPENDENT_AMBULATORY_CARE_PROVIDER_SITE_OTHER): Payer: Self-pay | Admitting: Physical Medicine and Rehabilitation

## 2019-01-13 ENCOUNTER — Ambulatory Visit: Payer: Medicare HMO | Admitting: Sports Medicine

## 2019-01-17 ENCOUNTER — Other Ambulatory Visit: Payer: Self-pay

## 2019-01-17 MED ORDER — GABAPENTIN 300 MG PO CAPS: ORAL_CAPSULE | ORAL | 0 refills | Status: DC

## 2019-01-22 ENCOUNTER — Encounter: Payer: Medicare HMO | Admitting: Family Medicine

## 2019-02-13 ENCOUNTER — Other Ambulatory Visit: Payer: Self-pay | Admitting: Sports Medicine

## 2019-02-13 NOTE — Telephone Encounter (Signed)
Called pt and verified that she does need more Tramadol.  States that she only has one pill left.  Informed her that Dr. Paulla Fore is no longer working at [facility] and that her care can be transferred to Dr. Tamala Julian.  Pt states that she doesn't feel like she needs an appointment since OMT didn't seem to help.  Only thing that helped her symptoms was the lumbar epidural she received.  I tell her that I will call Dr. Romona Curls office to determine if he has resumed doing lumbar epidurals and will return her call as soon as I have more information.  Will forward refill request to Dr. Tamala Julian and Douglass Rivers so they can take care of that piece of her request.

## 2019-02-13 NOTE — Telephone Encounter (Signed)
Do not do chronic pain medicine.  Will need to discuss and will need to be seen.  Can do virtual if patient wants

## 2019-02-14 NOTE — Telephone Encounter (Signed)
Didn't see your initial reply.  Sorry about that.  I have called pt and let her know that you'll do a temporary refill of Tramadol to get her through to next week per Ria Comment.  Also scheduled her for a virtual visit w/ you for Monday, May 4th at 1 pm

## 2019-02-16 NOTE — Progress Notes (Signed)
[facility] [address], [place] [address] Phone: [phone] Subjective:    Virtual Visit via Video Note  I connected with Candice Guerrero on 02/16/19 at  1:00 PM EDT by a video enabled telemedicine application and verified that I am speaking with the correct person using two identifiers.  Location: Patient: Patient was in home residence Provider: I was in office setting   I discussed the limitations of evaluation and management by telemedicine and the availability of in person appointments. The patient expressed understanding and agreed to proceed.      I discussed the assessment and treatment plan with the patient. The patient was provided an opportunity to ask questions and all were answered. The patient agreed with the plan and demonstrated an understanding of the instructions.   The patient was advised to call back or seek an in-person evaluation if the symptoms worsen or if the condition fails to improve as anticipated.  I provided 27 minutes of non-face-to-face time during this encounter.   Lyndal Pulley, DO     CC: Back pain  [id number]  Candice Guerrero is a 68 y.o. female coming in with complaint of back pain.  Has had a long history of this.  Multiple different orthopedic complaints over the course the years.  Patient's previous work-up included an MRI of the lumbar spine.  Found to have a central and leftward disc extrusion causing a free fragment at the L4-L5 on the left side with left L5 nerve root impingement. Patient undergone a left L5 nerve root block this was December 02, 2018.  Patient states was doing better for a while.  Patient continued to have pain and was given tramadol by another provider.  Been taking it regularly.  Feels like it is the only thing that seems to be helpful.  Patient is wanting to avoid surgical intervention.  Feels well but she is adamant that she needs the medication to continue to be functional.   Patient states she has tried different over-the-counter medications with very minimal benefit.  Pain is radiating down the leg.  Seems to be mostly on the left side.  Patient states rates the severity of pain is 9 out of 10.  Can wake her up at night.    Past Medical History:  Diagnosis Date  . Abnormal mammogram of right breast 07/18/2017  . Allergy    SEASONAL  . Benign hematuria   . Cataract    LEFT EYE  . Dental crowns present   . Depression   . Diabetes mellitus    NIDDM  . GERD (gastroesophageal reflux disease)   . Headache(784.0)    migraines, sinus  . Hiatal hernia   . Hyperlipidemia   . Hypertension    under control, has been on med. since 2005  . Osteopenia   . Psoriasis    arms, legs, back  . Snores    denies sx. of sleep apnea   Past Surgical History:  Procedure Laterality Date  . BREAST LUMPECTOMY WITH RADIOACTIVE SEED LOCALIZATION Right 07/18/2017   Procedure: RIGHT BREAST LUMPECTOMY WITH RADIOACTIVE SEED LOCALIZATION;  Surgeon: Fanny Skates, MD;  Location: [facility];  Service: General;  Laterality: Right;  . COLONOSCOPY    . DILATION AND CURETTAGE OF UTERUS    . HARDWARE REMOVAL  07/11/2012   Procedure: HARDWARE REMOVAL;  Surgeon: Cammie Sickle., MD;  Location: [facility];  Service: Orthopedics;  Laterality: Left;  pin  removal wire out and debride pisiform  . left wrist surgery     July 9th 2013  . TONSILLECTOMY    . TOOTH EXTRACTION Right 12/13/2016   Procedure: SURGICAL REMOVAL OF ODONTOGENIC TUMOR AND REMOVAL OF IMPACTED TOOTH #1;  Surgeon: Jannette Fogo, DDS;  Location: [facility];  Service: Oral Surgery;  Laterality: Right;  . TRIGGER FINGER RELEASE  07/11/2012   Procedure: RELEASE TRIGGER FINGER/A-1 PULLEY;  Surgeon: Cammie Sickle., MD;  Location: [facility];  Service: Orthopedics;  Laterality: Left;  LEFT LONG FINGER   Social History   Socioeconomic History  . Marital status:  Married    Spouse name: Not on file  . Number of children: Not on file  . Years of education: Not on file  . Highest education level: Not on file  Occupational History  . Not on file  Social Needs  . Financial resource strain: Not on file  . Food insecurity:    Worry: Not on file    Inability: Not on file  . Transportation needs:    Medical: Not on file    Non-medical: Not on file  Tobacco Use  . Smoking status: Former Smoker    Packs/day: 0.10    Years: 20.00    Pack years: 2.00    Types: Cigarettes    Last attempt to quit: 10/16/1998    Years since quitting: 20.3  . Smokeless tobacco: Never Used  . Tobacco comment: quit smoking in 2000  Substance and Sexual Activity  . Alcohol use: Yes    Comment: socially, weekends  . Drug use: No  . Sexual activity: Yes  Lifestyle  . Physical activity:    Days per week: Not on file    Minutes per session: Not on file  . Stress: Not on file  Relationships  . Social connections:    Talks on phone: Not on file    Gets together: Not on file    Attends religious service: Not on file    Active member of club or organization: Not on file    Attends meetings of clubs or organizations: Not on file    Relationship status: Not on file  Other Topics Concern  . Not on file  Social History Narrative   Moved to [facility] in 2011 from [place] due to more affordable.       Married 1972 Mortimer Fries). 2 kids. 5 grandkids. 2 in [place], 3 [place].       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       Hobbies: babysitting, casino   Allergies  Allergen Reactions  . Oxycodone Nausea Only  . Altace [Ramipril] Cough   Family History  Problem Relation Age of Onset  . Hyperlipidemia Mother   . Hypertension Mother   . Hypertension Father   . Kidney disease Father   . Anesthesia problems Sister        post-op N/V, hard to wake up post-op  . Diabetes Maternal Uncle     Current Outpatient Medications (Endocrine &  Metabolic):  .  metFORMIN (GLUCOPHAGE) 500 MG tablet, Take 1 tablet (500 mg total) by mouth 2 (two) times daily with a meal.  Current Outpatient Medications (Cardiovascular):  .  atorvastatin (LIPITOR) 40 MG tablet, Take 1 tablet (40 mg total) by mouth daily. .  valsartan-hydrochlorothiazide (DIOVAN-HCT) 80-12.5 MG tablet, Take 1 tablet by mouth daily.  Current Outpatient Medications (Respiratory):  .  mometasone (NASONEX)  50 MCG/ACT nasal spray, Place 2 sprays into the nose daily.  Current Outpatient Medications (Analgesics):  .  aspirin 81 MG tablet, Take 81 mg by mouth daily.   Marland Kitchen  aspirin-acetaminophen-caffeine (EXCEDRIN MIGRAINE) 250-250-65 MG per tablet, Take 1 tablet by mouth every 6 (six) hours as needed. Reported on 10/21/2015 .  traMADol (ULTRAM) 50 MG tablet, Take 1 tablet (50 mg total) by mouth every 8 (eight) hours as needed for up to 5 days.   Current Outpatient Medications (Other):  .  calcipotriene-betamethasone (TACLONEX) ointment, Apply topically daily. .  Calcium Carbonate (CALTRATE 600) 1500 MG TABS, Take by mouth daily.   .  citalopram (CELEXA) 20 MG tablet, Take 1 tablet (20 mg total) by mouth daily. .  cyclobenzaprine (FLEXERIL) 10 MG tablet, Take 1 tablet (10 mg total) by mouth 3 (three) times daily as needed for muscle spasms. .  diazepam (VALIUM) 5 MG tablet, Take 1 by mouth 1 hour  pre-procedure with very light food. May bring 2nd tablet to appointment. .  gabapentin (NEURONTIN) 300 MG capsule, Take 1 capsule, po, up to TID prn or as directed .  omeprazole (PRILOSEC) 40 MG capsule, Take 1 capsule (40 mg total) by mouth daily. Marland Kitchen  VITAMIN D, ERGOCALCIFEROL, PO, Take by mouth daily.    Past medical history, social, surgical and family history all reviewed in electronic medical record.  No pertanent information unless stated regarding to the chief complaint.   Review of Systems:  No headache, visual changes, nausea, vomiting, diarrhea, constipation, dizziness,  abdominal pain, skin rash, fevers, chills, night sweats, weight loss, swollen lymph nodes,, joint swelling,  chest pain, shortness of breath, mood changes.  Positive muscle aches or body aches  Objective     General: No apparent distress alert and oriented x3 mood and affect normal, dressed appropriately.  HEENT: Pupils equal, extraocular movements intact     Impression and Recommendations:     This case required medical decision making of moderate complexity. The above documentation has been reviewed and is accurate and complete Lyndal Pulley, DO       Note: This dictation was prepared with Dragon dictation along with smaller phrase technology. Any transcriptional errors that result from this process are unintentional.

## 2019-02-17 ENCOUNTER — Ambulatory Visit (INDEPENDENT_AMBULATORY_CARE_PROVIDER_SITE_OTHER): Payer: Medicare HMO | Admitting: Family Medicine

## 2019-02-17 ENCOUNTER — Encounter: Payer: Self-pay | Admitting: Family Medicine

## 2019-02-17 DIAGNOSIS — M5442 Lumbago with sciatica, left side: Secondary | ICD-10-CM

## 2019-02-17 MED ORDER — VITAMIN D (ERGOCALCIFEROL) 1.25 MG (50000 UNIT) PO CAPS: 50000.0000 [IU] | ORAL_CAPSULE | ORAL | 0 refills | Status: DC

## 2019-02-17 MED ORDER — DULOXETINE HCL 20 MG PO CPEP: 20.0000 mg | ORAL_CAPSULE | Freq: Every day | ORAL | 3 refills | Status: DC

## 2019-02-17 NOTE — Assessment & Plan Note (Signed)
Patient has some lumbar radiculopathy.  Had attempted gabapentin previously but this was at 300 mg.  Was unable to tolerate it.  We started 200 mg at night.  Transitioning patient Celexa to Cymbalta.  Warned of potential side effects but I think it could be beneficial.  Patient was given a 5-day prescription for the tramadol.  We discussed of this was going to be continuous and a pain medicine contract would be necessary.  Patient did not want to do that at this moment and I have encouraged her to try another nerve root injection.  This was ordered today.  Secondary to coronavirus outbreak may be delayed.  We will consider another short-term course of tramadol if necessary but would like patient to avoid it and when taking the tramadol I will take it with Tylenol for synergistic effect.  Patient is in agreement with the plan.

## 2019-02-17 NOTE — Patient Instructions (Addendum)
Good to meet you  Call [phone] to schedule.  Gabapentin 300mg  at night Cymbalta daily instead celxa  Once weekly vitamin D for 8 weeks and stop daily  Look at the following link  Take tramadol with a tylenol

## 2019-02-18 ENCOUNTER — Other Ambulatory Visit: Payer: Self-pay

## 2019-02-18 DIAGNOSIS — M5416 Radiculopathy, lumbar region: Secondary | ICD-10-CM

## 2019-02-19 ENCOUNTER — Telehealth: Payer: Self-pay | Admitting: *Deleted

## 2019-02-20 MED ORDER — TRIAZOLAM 0.25 MG PO TABS: ORAL_TABLET | ORAL | 0 refills | Status: DC

## 2019-02-20 NOTE — Telephone Encounter (Signed)
Pre-procedure triazolam ordered for pre-operative anxiety.  

## 2019-02-24 ENCOUNTER — Encounter: Payer: Self-pay | Admitting: Family Medicine

## 2019-02-25 ENCOUNTER — Other Ambulatory Visit: Payer: Self-pay | Admitting: Family Medicine

## 2019-02-26 ENCOUNTER — Encounter: Payer: Self-pay | Admitting: Physical Medicine and Rehabilitation

## 2019-02-27 DIAGNOSIS — E119 Type 2 diabetes mellitus without complications: Secondary | ICD-10-CM | POA: Diagnosis not present

## 2019-02-27 DIAGNOSIS — H5213 Myopia, bilateral: Secondary | ICD-10-CM | POA: Diagnosis not present

## 2019-02-27 LAB — HM DIABETES EYE EXAM

## 2019-03-06 ENCOUNTER — Encounter: Payer: Self-pay | Admitting: Family Medicine

## 2019-03-12 ENCOUNTER — Other Ambulatory Visit: Payer: Self-pay | Admitting: Family Medicine

## 2019-03-13 ENCOUNTER — Ambulatory Visit: Payer: Self-pay

## 2019-03-13 ENCOUNTER — Encounter

## 2019-03-13 ENCOUNTER — Other Ambulatory Visit: Payer: Self-pay

## 2019-03-13 ENCOUNTER — Ambulatory Visit: Payer: Medicare HMO | Admitting: Physical Medicine and Rehabilitation

## 2019-03-13 VITALS — BP 151/78 | HR 94

## 2019-03-13 DIAGNOSIS — M48061 Spinal stenosis, lumbar region without neurogenic claudication: Secondary | ICD-10-CM

## 2019-03-13 DIAGNOSIS — M5416 Radiculopathy, lumbar region: Secondary | ICD-10-CM

## 2019-03-13 MED ORDER — BETAMETHASONE SOD PHOS & ACET 6 (3-3) MG/ML IJ SUSP
12.0000 mg | Freq: Once | INTRAMUSCULAR | Status: AC
  Administered 2019-03-13: 12 mg

## 2019-03-13 NOTE — Progress Notes (Signed)
Candice Guerrero - 68 y.o. female MRN [id number]  Date of birth: 1951/10/12  Office Visit Note: Visit Date: 03/13/2019 PCP: [name], MD Referred by: [name], MD  Subjective: No chief complaint on file.  HPI:  Candice Guerrero is a 68 y.o. female who comes in today For planned left L5 transforaminal epidural steroid injection.  Patient has disc extrusion at L4-5 with left L5 radicular complaints.  She initially got an injection which was an L5 transforaminal injection performed at [facility] in February and got really tremendous amount of relief up until just a few weeks ago.  She has been followed in the past by Dr. Teresa Coombs and her primary care physician is Dr. [name].  My understanding that Dr. Paulla Fore is no longer there and at this point should her symptoms continue she can obviously call me in the interim.  We are going to repeat the injection and we did talk about the natural history of disc herniations and radicular pain.  She does have some diabetes we talked about blood sugar increases with the injection.  She probably would do well with microdiscectomy if she needed to have that done but we do know that in all likelihood this will tend to get better with time if we can just keep her comfortable.  Alternative treatments could be nerve membrane stabilizing medications and those could be maximized.  She was started on gabapentin by Dr. Paulla Fore.  She also endorses a feeling of weakness in the left lateral ankle.  We suggested small ankle sleeve with a neoprene sleeve or Ace bandage just for proprioception type of support.  In all likelihood strength will continue to improve over time.  If this does worsen it would be another reason to possibly look at surgical referral.  ROS Otherwise per HPI.  Assessment & Plan: Visit Diagnoses:  1. Lumbar radiculopathy   2. Bilateral stenosis of lateral recess of lumbar spine     Plan: No additional findings.   Meds & Orders:   Meds ordered this encounter  Medications  . betamethasone acetate-betamethasone sodium phosphate (CELESTONE) injection 12 mg    Orders Placed This Encounter  Procedures  . XR C-ARM NO REPORT  . Epidural Steroid injection    Follow-up: Return if symptoms worsen or fail to improve.   Procedures: No procedures performed  No notes on file   Clinical History: MRI LUMBAR SPINE WITHOUT CONTRAST  TECHNIQUE: Multiplanar, multisequence MR imaging of the lumbar spine was performed. No intravenous contrast was administered.  COMPARISON:  [facility] 11/06/2018.  FINDINGS: Segmentation:  Standard.  Alignment:  Anatomic.  Vertebrae: No worrisome osseous lesion. Endplate reactive changes L5-S1.  Conus medullaris and cauda equina: Conus extends to the L1 level. Conus and cauda equina appear normal.  Paraspinal and other soft tissues: Unremarkable.  Disc levels:  L1-L2: Disc space narrowing. Central and rightward protrusion. No compressive subarticular zone or foraminal zone narrowing.  L2-L3:  Disc desiccation.  Central protrusion.  No impingement.  L3-L4: Unremarkable disc space. Mild facet arthropathy. No impingement.  L4-L5: Good disc height and signal. Central and leftward disc extrusion. Caudally migrated free fragment on the LEFT. Facet arthropathy is superimposed. LEFT L5 neural impingement is likely. No compressive foraminal narrowing.  L5-S1: Advanced disc space narrowing. Osseous spurring. Facet arthropathy. No impingement.  IMPRESSION: Central and leftward disc extrusion with caudally migrated free fragment at L4-5 on the LEFT. LEFT L5 neural impingement is likely.  Minor lumbar spondylosis elsewhere, not clearly  compressive.   Electronically Signed   By: Staci Righter M.D.   On: 11/21/2018 10:01     Objective:  VS:  HT:    WT:   BMI:     BP:(!) 151/78  HR:94bpm  TEMP: ( )  RESP:100 % Physical Exam  Ortho Exam Imaging: Xr C-arm No  Report  Result Date: 03/13/2019 Please see Notes tab for imaging impression.

## 2019-03-13 NOTE — Procedures (Signed)
Lumbosacral Transforaminal Epidural Steroid Injection - Sub-Pedicular Approach with Fluoroscopic Guidance  Patient: Candice Guerrero      Date of Birth: 05-24-1951 MRN: [id number] PCP: [name], MD      Visit Date: 03/13/2019   Universal Protocol:    Date/Time: 03/13/2019  Consent Given By: the patient  Position: PRONE  Additional Comments: Vital signs were monitored before and after the procedure. Patient was prepped and draped in the usual sterile fashion. The correct patient, procedure, and site was verified.   Injection Procedure Details:  Procedure Site One Meds Administered:  Meds ordered this encounter  Medications  . betamethasone acetate-betamethasone sodium phosphate (CELESTONE) injection 12 mg    Laterality: Left  Location/Site:  L5-S1  Needle size: 22 G  Needle type: Spinal  Needle Placement: Transforaminal  Findings:    -Comments: Excellent flow of contrast along the nerve and into the epidural space.  Procedure Details: After squaring off the end-plates to get a true AP view, the C-arm was positioned so that an oblique view of the foramen as noted above was visualized. The target area is just inferior to the "nose of the scotty dog" or sub pedicular. The soft tissues overlying this structure were infiltrated with 2-3 ml. of 1% Lidocaine without Epinephrine.  The spinal needle was inserted toward the target using a "trajectory" view along the fluoroscope beam.  Under AP and lateral visualization, the needle was advanced so it did not puncture dura and was located close the 6 O'Clock position of the pedical in AP tracterory. Biplanar projections were used to confirm position. Aspiration was confirmed to be negative for CSF and/or blood. A 1-2 ml. volume of Isovue-250 was injected and flow of contrast was noted at each level. Radiographs were obtained for documentation purposes.   After attaining the desired flow of contrast documented above, a 0.5 to 1.0  ml test dose of 0.25% Marcaine was injected into each respective transforaminal space.  The patient was observed for 90 seconds post injection.  After no sensory deficits were reported, and normal lower extremity motor function was noted,   the above injectate was administered so that equal amounts of the injectate were placed at each foramen (level) into the transforaminal epidural space.   Additional Comments:  The patient tolerated the procedure well Dressing: 2 x 2 sterile gauze and Band-Aid    Post-procedure details: Patient was observed during the procedure. Post-procedure instructions were reviewed.  Patient left the clinic in stable condition.

## 2019-03-13 NOTE — Progress Notes (Signed)
 .  Numeric Pain Rating Scale and Functional Assessment Average Pain 8   In the last MONTH (on 0-10 scale) has pain interfered with the following?  1. General activity like being  able to carry out your everyday physical activities such as walking, climbing stairs, carrying groceries, or moving a chair?  Rating(8)   +Driver, -BT, -Dye Allergies.  

## 2019-03-14 ENCOUNTER — Encounter: Payer: Self-pay | Admitting: Family Medicine

## 2019-03-16 ENCOUNTER — Other Ambulatory Visit: Payer: Medicare HMO

## 2019-03-16 ENCOUNTER — Telehealth: Payer: Self-pay | Admitting: Internal Medicine

## 2019-03-16 DIAGNOSIS — Z20822 Contact with and (suspected) exposure to covid-19: Secondary | ICD-10-CM

## 2019-03-16 NOTE — Telephone Encounter (Signed)
Spoke with the patient about possible Covid-19 exposure at 03/13/19 office visit to [facility]. RN explained the free Covid-19 screening offered for this exposure. Notified that she and her driver should wear a masks and the drive-up screening will take place at the old [facility] on [address]. Patient chose to come to [facility] 03/16/19 @ 1300.

## 2019-03-17 ENCOUNTER — Encounter: Payer: Self-pay | Admitting: Family Medicine

## 2019-03-17 LAB — NOVEL CORONAVIRUS, NAA: SARS-CoV-2, NAA: NOT DETECTED

## 2019-03-19 ENCOUNTER — Encounter: Payer: Medicare HMO | Admitting: Physical Medicine and Rehabilitation

## 2019-03-25 DIAGNOSIS — R69 Illness, unspecified: Secondary | ICD-10-CM | POA: Diagnosis not present

## 2019-04-11 ENCOUNTER — Other Ambulatory Visit: Payer: Self-pay | Admitting: Family Medicine

## 2019-04-14 DIAGNOSIS — R69 Illness, unspecified: Secondary | ICD-10-CM | POA: Diagnosis not present

## 2019-04-28 ENCOUNTER — Other Ambulatory Visit: Payer: Self-pay

## 2019-04-28 ENCOUNTER — Ambulatory Visit (INDEPENDENT_AMBULATORY_CARE_PROVIDER_SITE_OTHER): Payer: Medicare HMO | Admitting: Family Medicine

## 2019-04-28 ENCOUNTER — Encounter: Payer: Self-pay | Admitting: Family Medicine

## 2019-04-28 VITALS — BP 142/88 | HR 93 | Temp 98.6°F | Ht 61.0 in | Wt 168.8 lb

## 2019-04-28 DIAGNOSIS — E785 Hyperlipidemia, unspecified: Secondary | ICD-10-CM | POA: Diagnosis not present

## 2019-04-28 DIAGNOSIS — E559 Vitamin D deficiency, unspecified: Secondary | ICD-10-CM | POA: Diagnosis not present

## 2019-04-28 DIAGNOSIS — E669 Obesity, unspecified: Secondary | ICD-10-CM

## 2019-04-28 DIAGNOSIS — Z0000 Encounter for general adult medical examination without abnormal findings: Secondary | ICD-10-CM

## 2019-04-28 DIAGNOSIS — E1159 Type 2 diabetes mellitus with other circulatory complications: Secondary | ICD-10-CM | POA: Diagnosis not present

## 2019-04-28 DIAGNOSIS — I152 Hypertension secondary to endocrine disorders: Secondary | ICD-10-CM

## 2019-04-28 DIAGNOSIS — Z79899 Other long term (current) drug therapy: Secondary | ICD-10-CM

## 2019-04-28 DIAGNOSIS — E1169 Type 2 diabetes mellitus with other specified complication: Secondary | ICD-10-CM

## 2019-04-28 DIAGNOSIS — I10 Essential (primary) hypertension: Secondary | ICD-10-CM | POA: Diagnosis not present

## 2019-04-28 DIAGNOSIS — R69 Illness, unspecified: Secondary | ICD-10-CM | POA: Diagnosis not present

## 2019-04-28 DIAGNOSIS — F418 Other specified anxiety disorders: Secondary | ICD-10-CM

## 2019-04-28 DIAGNOSIS — E119 Type 2 diabetes mellitus without complications: Secondary | ICD-10-CM

## 2019-04-28 LAB — POC URINALSYSI DIPSTICK (AUTOMATED)
Bilirubin, UA: NEGATIVE
Blood, UA: POSITIVE
Glucose, UA: NEGATIVE
Ketones, UA: NEGATIVE
Leukocytes, UA: NEGATIVE
Nitrite, UA: NEGATIVE
Protein, UA: NEGATIVE
Spec Grav, UA: 1.02 (ref 1.010–1.025)
Urobilinogen, UA: 0.2 E.U./dL
pH, UA: 6.5 (ref 5.0–8.0)

## 2019-04-28 LAB — CBC
HCT: 42.3 % (ref 36.0–46.0)
Hemoglobin: 13.7 g/dL (ref 12.0–15.0)
MCHC: 32.4 g/dL (ref 30.0–36.0)
MCV: 90.9 fl (ref 78.0–100.0)
Platelets: 319 10*3/uL (ref 150.0–400.0)
RBC: 4.66 Mil/uL (ref 3.87–5.11)
RDW: 13.7 % (ref 11.5–15.5)
WBC: 10.5 10*3/uL (ref 4.0–10.5)

## 2019-04-28 LAB — POCT GLYCOSYLATED HEMOGLOBIN (HGB A1C): Hemoglobin A1C: 7 % — AB (ref 4.0–5.6)

## 2019-04-28 NOTE — Progress Notes (Signed)
Phone: [phone]   Subjective:  Patient presents today for their annual physical. Chief complaint-noted.   See problem oriented charting- ROS- full  review of systems was completed and negative except for: sinus pressure, seasonal allergies, tremors, headache  The following were reviewed and entered/updated in epic: Past Medical History:  Diagnosis Date   Abnormal mammogram of right breast 07/18/2017   Allergy    SEASONAL   Benign hematuria    Cataract    LEFT EYE   Dental crowns present    Depression    Diabetes mellitus    NIDDM   GERD (gastroesophageal reflux disease)    Headache(784.0)    migraines, sinus   Hiatal hernia    Hyperlipidemia    Hypertension    under control, has been on med. since 2005   Osteopenia    Psoriasis    arms, legs, back   Snores    denies sx. of sleep apnea   Patient Active Problem List   Diagnosis Date Noted   Diabetes mellitus type II, controlled, with no complications ([place]) 75/17/0017    Priority: High   IBS (irritable bowel syndrome) 07/24/2014    Priority: Medium   Psoriasis 12/13/2010    Priority: Medium   Hypertension associated with diabetes ([place]) 12/13/2010    Priority: Medium   Depression with anxiety 12/13/2010    Priority: Medium   Fatty liver 12/13/2010    Priority: Medium   Hyperlipidemia associated with type 2 diabetes mellitus ([place]) 12/13/2010    Priority: Medium   Abnormal mammogram of right breast 07/18/2017    Priority: Low   Osteopenia 07/24/2014    Priority: Low   Allergic rhinitis 07/24/2014    Priority: Low   Obesity (BMI 30-39.9) 12/31/2013    Priority: Low   Benign hematuria 12/13/2010    Priority: Low   GERD (gastroesophageal reflux disease) 12/13/2010    Priority: Low   Acute midline low back pain with left-sided sciatica 11/06/2018   Past Surgical History:  Procedure Laterality Date   BREAST LUMPECTOMY WITH RADIOACTIVE SEED LOCALIZATION Right 07/18/2017   Procedure: RIGHT BREAST LUMPECTOMY WITH RADIOACTIVE SEED LOCALIZATION;  Surgeon: Fanny Skates, MD;  Location: [facility];  Service: General;  Laterality: Right;   [place] REMOVAL  07/11/2012   Procedure: HARDWARE REMOVAL;  Surgeon: Cammie Sickle., MD;  Location: [facility];  Service: Orthopedics;  Laterality: Left;  pin removal wire out and debride pisiform   left wrist surgery     July 9th 2013   TONSILLECTOMY     TOOTH EXTRACTION Right 12/13/2016   Procedure: SURGICAL REMOVAL OF ODONTOGENIC TUMOR AND REMOVAL OF IMPACTED TOOTH #1;  Surgeon: Jannette Fogo, DDS;  Location: [facility];  Service: Oral Surgery;  Laterality: Right;   TRIGGER FINGER RELEASE  07/11/2012   Procedure: RELEASE TRIGGER FINGER/A-1 PULLEY;  Surgeon: Cammie Sickle., MD;  Location: [facility];  Service: Orthopedics;  Laterality: Left;  LEFT LONG FINGER    Family History  Problem Relation Age of Onset   Hyperlipidemia Mother    Hypertension Mother    Hypertension Father    Kidney disease Father    Anesthesia problems Sister        post-op N/V, hard to wake up post-op   Diabetes Maternal Uncle     Medications- reviewed and updated Current Outpatient Medications  Medication Sig Dispense Refill  aspirin 81 MG tablet Take 81 mg by mouth daily.       aspirin-acetaminophen-caffeine (EXCEDRIN MIGRAINE) 250-250-65 MG per tablet Take 1 tablet by mouth every 6 (six) hours as needed. Reported on 10/21/2015     atorvastatin (LIPITOR) 40 MG tablet Take 1 tablet (40 mg total) by mouth daily. 90 tablet 3   calcipotriene-betamethasone (TACLONEX) ointment Apply topically daily. 60 g 0   Calcium Carbonate (CALTRATE 600) 1500 MG TABS Take by mouth daily.       DULoxetine (CYMBALTA) 20 MG capsule TAKE 1 CAPSULE BY MOUTH EVERY DAY 90 capsule 2   gabapentin (NEURONTIN) 300 MG capsule Take 1  capsule, po, up to TID prn or as directed 270 capsule 0   metFORMIN (GLUCOPHAGE) 500 MG tablet Take 1 tablet (500 mg total) by mouth 2 (two) times daily with a meal. 180 tablet 3   omeprazole (PRILOSEC) 40 MG capsule Take 1 capsule (40 mg total) by mouth daily. 90 capsule 3   valsartan-hydrochlorothiazide (DIOVAN-HCT) 80-12.5 MG tablet Take 1 tablet by mouth daily. 90 tablet 3   Vitamin D, Ergocalciferol, (DRISDOL) 1.25 MG (50000 UT) CAPS capsule TAKE 1 CAPSULE ONCE EVERY 7 DAYS 8 capsule 0   No current facility-administered medications for this visit.     Allergies-reviewed and updated Allergies  Allergen Reactions   Oxycodone Nausea Only   Altace [Ramipril] Cough    Social History   Social History Narrative   Moved to [place] in 2011 from [place] due to more affordable.       Married 1972 Mortimer Fries). 2 kids. 5 grandkids. 2 in [place], 3 [place].       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       Hobbies: babysitting, casino   Objective  Objective:  BP (!) 160/94 (BP Location: Left Arm, Patient Position: Sitting, Cuff Size: Normal)    Pulse 93    Temp 98.6 F (37 C) (Oral)    Ht 5\' 1"  (1.549 m)    Wt 168 lb 12.8 oz (76.6 kg)    LMP  (LMP Unknown)    SpO2 97%    BMI 31.89 kg/m  Gen: NAD, resting comfortably HEENT: Mucous membranes are moist. Oropharynx normal Neck: no thyromegaly CV: RRR no murmurs rubs or gallops Lungs: CTAB no crackles, wheeze, rhonchi Abdomen: soft/nontender/nondistended/normal bowel sounds. No rebound or guarding.  Ext: no edema Skin: warm, dry Neuro: grossly normal, moves all extremities, PERRLA   Assessment and Plan   68 y.o. female presenting for annual physical.  Health Maintenance counseling: 1. Anticipatory guidance: Patient counseled regarding regular dental exams -q6 months, eye exams - yearly,  avoiding smoking and second hand smoke , limiting alcohol to 1 beverage per day .   2. Risk factor reduction:   Advised patient of need for regular exercise and diet rich and fruits and vegetables to reduce risk of heart attack and stroke. Exercise-  doing some walking (misses going to the gym) 3-4 days a week. Diet-weight up about 8 lbs with covid 19-counseling provided .  Wt Readings from Last 3 Encounters:  04/28/19 168 lb 12.8 oz (76.6 kg)  11/11/18 159 lb 3.2 oz (72.2 kg)  11/06/18 160 lb 3.2 oz (72.7 kg)  3. Immunizations/screenings/ancillary studies-immunizations fully up-to-date-advised fall flu shot Immunization History  Administered Date(s) Administered   Influenza Split 08/09/2011, 08/13/2012   Influenza, High Dose Seasonal PF 08/17/2016, 07/30/2017, 07/23/2018   Influenza,inj,Quad PF,6+ Mos 06/30/2013, 07/24/2014, 08/09/2015  Pneumococcal Conjugate-13 12/31/2013   Pneumococcal Polysaccharide-23 01/26/2015   Tdap 10/16/2002, 02/24/2013   Zoster 12/13/2010   Zoster Recombinat (Shingrix) 03/14/2017, 05/21/2017  4. Cervical cancer screening- past age to based screening but still follows up with Dr. Landry Mellow of [place] 5. Breast cancer screening-  breast exam with GYN and mammogram August 2019 with 1 year repeat 6. Colon cancer screening - July 2015 with 10-year follow-up 7. Skin cancer screening- still seeing dermatology - once a year due to melanoma in situ. advised regular sunscreen use. Denies worrisome, changing, or new skin lesions.  8. Birth control/STD check- monogamous/postmenopausal 9. Osteoporosis screening at 62- done 01/24/2011- she will try to get a copy of dexa- has tried many times before but no luck to get more recent one 10.  Former smoker- 2 pack years and quit in 2000-no regular screenings required  Status of chronic or acute concerns  HTN - Taking valsartan-HCT 80.12.5 mg. Checking BP at home, rarely over 140/90. She reports HA, takes Excedrin for this. Denies dizziness, visual changes, CP, SOB. No missed doses, no side effects. Limits added salt to food, occasional  uses while cooking.   - mild elevation on repeat today- we opted to follow up in 4 months as long as home #s remain well controlled-   GERD - Taking omeprazole 40 mg daily, controlling sx well.   DM - Taking Metformin 500 mg. She missed a few doses over the weekend. No side effects. Not checking BG at home. Reports occasional hypoglycemic episode, mostly jittering. Not doing well watching carb and sugar intake. Not exercising d/t pandemic.  -discussed turning lifestyle choices around Lab Results  Component Value Date   HGBA1C 7.0 (A) 04/28/2019  worsening but still poor control  Hyperlipidemia - Taking Atorvastatin 40 mg and Aspirin 81 mg. No missed doses, no side effects. Update lipids today  Depression/anxiety - Taking Duloxetine 20 mg . Managing sx well. in full remission    Vit D deficiency - weekly Rx prescribed by Dr. Tamala Julian.   [facility] and Dr. Ernestina Patches have done injections for pinched in low back - still some paresthsias in foot from time to time. On gabapentin and cymbalta.   Tripped on uneven sidewalk and fell down about 4 months ago- advised to be cautious with footing.   Intention tremor- likely essential tremor- no resting tremor. Like with holding a cup of coffee   Recommended follow up: 4 months  Lab/Order associations: not fasting for labs    ICD-10-CM   1. Preventative health care  Z00.00 CBC    Comprehensive metabolic panel    Lipid panel    Vitamin B12    VITAMIN D 25 Hydroxy (Vit-D Deficiency, Fractures)  2. Controlled type 2 diabetes mellitus without complication, without long-term current use of insulin (HCC)  E11.9 POCT glycosylated hemoglobin (Hb A1C)    CBC    Comprehensive metabolic panel    Lipid panel  3. High risk medication use  Z79.899 Vitamin B12    VITAMIN D 25 Hydroxy (Vit-D Deficiency, Fractures)    No orders of the defined types were placed in this encounter.   Return precautions advised.  [name], MD

## 2019-04-28 NOTE — Addendum Note (Signed)
Addended by: Francis Dowse T on: 04/28/2019 02:20 PM   Modules accepted: Orders

## 2019-04-28 NOTE — Progress Notes (Signed)
Phone: [phone]    Subjective:   Patient presents today for their annual wellness visit.    Preventive Screening-Counseling & Management  Smoking Status: former Smoker- quit in 2000 with 2 pack years- no regular screenings required Second Hand Smoking status: No smokers in home Alcohol intake: <7 per week  Advanced directives: Full code - wants to get advanced directives in place  Risk Factors Regular exercise: walking 3-4 days a week for 30-45 minutes Diet: many indiscretions recently- counseling prvoided  Fall Risk: None  Fall Risk  04/28/2019 07/23/2018 03/14/2017 02/28/2016 01/26/2015  Falls in the past year? 1 No No No No  Number falls in past yr: 0 - - - -  Injury with Fall? 0 - - - -  Risk for fall due to : Impaired balance/gait - - - -  Follow up Education provided - - - -  Opioid use history: no long term opioids use  Cardiac risk factors:  advanced age (older than 60 for men, 5 for women)  Hyperlipidemia - on statin Hypertension - controlled normally diabetes. - controlled Family History:  none  Depression Screen None. PHQ2 9 controlled Depression screen [facility] 2/9 04/28/2019 07/23/2018 07/30/2017 03/14/2017 02/28/2016  Decreased Interest 0 0 0 0 0  Down, Depressed, Hopeless 0 0 0 0 0  PHQ - 2 Score 0 0 0 0 0  Altered sleeping 1 0 0 - -  Tired, decreased energy 1 0 0 - -  Change in appetite 0 0 0 - -  Feeling bad or failure about yourself  0 0 0 - -  Trouble concentrating 0 0 0 - -  Moving slowly or fidgety/restless 0 0 0 - -  Suicidal thoughts 0 0 0 - -  PHQ-9 Score 2 0 0 - -  Difficult doing work/chores Not difficult at all Not difficult at all Not difficult at all - -   Activities of Daily Living Independent ADLs and IADLs   Hearing Difficulties: -patient declines significant hearing issues, mild issues with whispers with masks currently- likely result of covid 19 making  Cognitive Testing             No reported trouble.   Normal 3 word recall and  clock draw = mini cog normal  List the Names of Other Physician/Practitioners you currently use: -Dr. Landry Mellow GYN -Dr. Paulla Fore sports medicine--> now with Dr. Tamala Julian of sports medicine - Dr. Ernestina Patches of [facility] has done injections - Dr. Delman Cheadle optometry - Dr. Sabra Heck oral surgeon - Dr. 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 Influenza Split 08/09/2011, 08/13/2012  . Influenza, High Dose Seasonal PF 08/17/2016, 07/30/2017, 07/23/2018  . Influenza,inj,Quad PF,6+ Mos 06/30/2013, 07/24/2014, 08/09/2015  . Pneumococcal Conjugate-13 12/31/2013  . Pneumococcal Polysaccharide-23 01/26/2015  . Tdap 10/16/2002, 02/24/2013  . Zoster 12/13/2010  . Zoster Recombinat (Shingrix) 03/14/2017, 05/21/2017   Required Immunizations needed today -none- advised fall flu shot  Health Maintenance  Topic Date Due  . Hemoglobin A1C  01/22/2019  . Flu Shot  05/17/2019  . Complete foot exam   07/24/2019  . Eye exam for diabetics  02/27/2020  . Mammogram  05/30/2020  . Tetanus Vaccine  02/25/2023  . Colon Cancer Screening  04/30/2024  . DEXA scan (bone density measurement)  Completed  .  Hepatitis C: One time screening is recommended by Center for Disease Control  (CDC) for  adults born from [birth year] through 1967.   Completed  .  Pneumonia vaccines  Completed    Screening tests- up to date 1.  Breast cancer screening-August 20 nineteen 3D mammogram 2.  Colon cancer screening-July 2015 with 10-year follow-up 3.  Cervical cancer screening-past age based screening-still follows up with Dr. Landry [facility]ellow [facility] 4.  Lung cancer screening- not enough pack years and quit over 15 years ago so does not qualify  ROS- No pertinent positives discovered in course of AWV-see physical form  The following were reviewed and entered/updated in epic: Past Medical History:  Diagnosis Date  . Abnormal mammogram of right breast 07/18/2017  . Allergy     SEASONAL  . Benign hematuria   . Cataract    LEFT EYE  . Dental crowns present   . Depression   . Diabetes mellitus    NIDDM  . GERD (gastroesophageal reflux disease)   . Headache(784.0)    migraines, sinus  . Hiatal hernia   . Hyperlipidemia   . Hypertension    under control, has been on med. since 2005  . Osteopenia   . Psoriasis    arms, legs, back  . Snores    denies sx. of sleep apnea   Patient Active Problem List   Diagnosis Date Noted  . Diabetes mellitus type II, controlled, with no complications ([place]) 71/69/6789    Priority: High  . IBS (irritable bowel syndrome) 07/24/2014    Priority: Medium  . Psoriasis 12/13/2010    Priority: Medium  . Hypertension associated with diabetes ([place]) 12/13/2010    Priority: Medium  . Depression with anxiety 12/13/2010    Priority: Medium  . Fatty liver 12/13/2010    Priority: Medium  . Hyperlipidemia associated with type 2 diabetes mellitus ([place]) 12/13/2010    Priority: Medium  . Abnormal mammogram of right breast 07/18/2017    Priority: Low  . Osteopenia 07/24/2014    Priority: Low  . Allergic rhinitis 07/24/2014    Priority: Low  . Obesity (BMI 30-39.9) 12/31/2013    Priority: Low  . Benign hematuria 12/13/2010    Priority: Low  . GERD (gastroesophageal reflux disease) 12/13/2010    Priority: Low  . Acute midline low back pain with left-sided sciatica 11/06/2018   Past Surgical History:  Procedure Laterality Date  . BREAST LUMPECTOMY WITH RADIOACTIVE SEED LOCALIZATION Right 07/18/2017   Procedure: RIGHT BREAST LUMPECTOMY WITH RADIOACTIVE SEED LOCALIZATION;  Surgeon: Fanny Skates, MD;  Location: [facility];  Service: General;  Laterality: Right;  . COLONOSCOPY    . DILATION AND CURETTAGE OF UTERUS    . HARDWARE REMOVAL  07/11/2012   Procedure: HARDWARE REMOVAL;  Surgeon: Cammie Sickle., MD;  Location: [facility];  Service: Orthopedics;  Laterality: Left;  pin removal wire out and  debride pisiform  . left wrist surgery     July 9th 2013  . TONSILLECTOMY    . TOOTH EXTRACTION Right 12/13/2016   Procedure: SURGICAL REMOVAL OF ODONTOGENIC TUMOR AND REMOVAL OF IMPACTED TOOTH #1;  Surgeon: Jannette Fogo, DDS;  Location: [facility];  Service: Oral Surgery;  Laterality: Right;  . TRIGGER FINGER RELEASE  07/11/2012   Procedure: RELEASE TRIGGER FINGER/A-1 PULLEY;  Surgeon: Cammie Sickle., MD;  Location: [facility];  Service: Orthopedics;  Laterality: Left;  LEFT LONG FINGER    Family History  Problem Relation Age of Onset  . Hyperlipidemia Mother   . Hypertension Mother   . Hypertension Father   . Kidney disease Father   .  Anesthesia problems Sister        post-op N/V, hard to wake up post-op  . Diabetes Maternal Uncle     Medications- reviewed and updated Current Outpatient Medications  Medication Sig Dispense Refill  . aspirin 81 MG tablet Take 81 mg by mouth daily.      Marland Kitchen aspirin-acetaminophen-caffeine (EXCEDRIN MIGRAINE) 250-250-65 MG per tablet Take 1 tablet by mouth every 6 (six) hours as needed. Reported on 10/21/2015    . atorvastatin (LIPITOR) 40 MG tablet Take 1 tablet (40 mg total) by mouth daily. 90 tablet 3  . calcipotriene-betamethasone (TACLONEX) ointment Apply topically daily. 60 g 0  . Calcium Carbonate (CALTRATE 600) 1500 MG TABS Take by mouth daily.      . DULoxetine (CYMBALTA) 20 MG capsule TAKE 1 CAPSULE BY MOUTH EVERY DAY 90 capsule 2  . gabapentin (NEURONTIN) 300 MG capsule Take 1 capsule, po, up to TID prn or as directed 270 capsule 0  . metFORMIN (GLUCOPHAGE) 500 MG tablet Take 1 tablet (500 mg total) by mouth 2 (two) times daily with a meal. 180 tablet 3  . omeprazole (PRILOSEC) 40 MG capsule Take 1 capsule (40 mg total) by mouth daily. 90 capsule 3  . valsartan-hydrochlorothiazide (DIOVAN-HCT) 80-12.5 MG tablet Take 1 tablet by mouth daily. 90 tablet 3  . Vitamin D, Ergocalciferol, (DRISDOL) 1.25 MG (50000 UT)  CAPS capsule TAKE 1 CAPSULE ONCE EVERY 7 DAYS 8 capsule 0   No current facility-administered medications for this visit.     Allergies-reviewed and updated Allergies  Allergen Reactions  . Oxycodone Nausea Only  . Altace [Ramipril] Cough    Social History   Socioeconomic History  . Marital status: Married    Spouse name: Not on file  . Number of children: Not on file  . Years of education: Not on file  . Highest education level: Not on file  Occupational History  . Not on file  Social Needs  . Financial resource strain: Not on file  . Food insecurity    Worry: Not on file    Inability: Not on file  . Transportation needs    Medical: Not on file    Non-medical: Not on file  Tobacco Use  . Smoking status: Former Smoker    Packs/day: 0.10    Years: 20.00    Pack years: 2.00    Types: Cigarettes    Quit date: 10/16/1998    Years since quitting: 20.5  . Smokeless tobacco: Never Used  . Tobacco comment: quit smoking in 2000  Substance and Sexual Activity  . Alcohol use: Yes    Comment: socially, weekends  . Drug use: No  . Sexual activity: Yes  Lifestyle  . Physical activity    Days per week: Not on file    Minutes per session: Not on file  . Stress: Not on file  Relationships  . Social Herbalist on phone: Not on file    Gets together: Not on file    Attends religious service: Not on file    Active member of club or organization: Not on file    Attends meetings of clubs or organizations: Not on file    Relationship status: Not on file  Other Topics Concern  . Not on file  Social History Narrative   Moved to [facility] in 2011 from [place] due to more affordable.       Married 1972 Mortimer Fries). 2 kids. 5 grandkids. 2 in [place],  3 [place].       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       Hobbies: babysitting, casino      Objective:  BP (!) 160/94 (BP Location: Left Arm, Patient Position: Sitting, Cuff Size:  Normal)   Pulse 93   Temp 98.6 F (37 C) (Oral)   Ht 5\' 1"  (1.549 m)   Wt 168 lb 12.8 oz (76.6 kg)   LMP  (LMP Unknown)   SpO2 97%   BMI 31.89 kg/m    Assessment/Plan:  AWV completed- discussed recommended screenings and documented any personalized health advice and referrals for preventive counseling. See AVS as well which was given to patient.   Status of chronic or acute concerns  See physical form  Recommended follow up: 6 months as long as A1c remains controlled   Lab/Order associations:   ICD-10-CM   1. Preventative health care  Z00.00 CBC    Comprehensive metabolic panel    Lipid panel    Vitamin B12    VITAMIN D 25 Hydroxy (Vit-D Deficiency, Fractures)   Return precautions advised. [name], MD

## 2019-04-28 NOTE — Patient Instructions (Addendum)
Health Maintenance Due  Topic Date Due  . HEMOGLOBIN A1C - was 7.0 today 01/22/2019   Please stop by lab before you go If you do not have mychart- we will call you about results within 5 business days of [place] receiving them.  If you have mychart- we will send your results within 3 business days of [place] receiving them.  If abnormal or we want to clarify a result, we will call or mychart you to make sure you receive the message.  If you have questions or concerns or don't hear within 5-7 days, please send [place] a message or call us.   Candice Guerrero , Thank you for taking time to come for your Medicare Wellness Visit. I appreciate your ongoing commitment to your health goals. Please review the following plan we discussed and let me know if I can assist you in the future.   These are the goals we discussed: 1. Myfitnesspal is a good application for your phone that can help you track/count calories 2. Goal 5-10 lbs off by follow up 3. [employer] moving back towards a1c 6.5 by follow up 4. See [place] sooner than 4 months if blood pressure remains over 140/90 at home- slightly high today  This is a list of the screening recommended for you and due dates:  Health Maintenance  Topic Date Due  . Hemoglobin A1C  01/22/2019  . Flu Shot  05/17/2019  . Complete foot exam   07/24/2019  . Eye exam for diabetics  02/27/2020  . Mammogram  05/30/2020  . Tetanus Vaccine  02/25/2023  . Colon Cancer Screening  04/30/2024  . DEXA scan (bone density measurement)  Completed  .  Hepatitis C: One time screening is recommended by Center for Disease Control  (CDC) for  adults born from [birth year] through 1967.   Completed  . Pneumonia vaccines  Completed

## 2019-04-29 ENCOUNTER — Other Ambulatory Visit: Payer: Self-pay | Admitting: *Deleted

## 2019-04-29 DIAGNOSIS — R319 Hematuria, unspecified: Secondary | ICD-10-CM

## 2019-04-29 LAB — COMPREHENSIVE METABOLIC PANEL
ALT: 38 U/L — ABNORMAL HIGH (ref 0–35)
AST: 29 U/L (ref 0–37)
Albumin: 4.5 g/dL (ref 3.5–5.2)
Alkaline Phosphatase: 99 U/L (ref 39–117)
BUN: 22 mg/dL (ref 6–23)
CO2: 32 mEq/L (ref 19–32)
Calcium: 9.2 mg/dL (ref 8.4–10.5)
Chloride: 101 mEq/L (ref 96–112)
Creatinine, Ser: 0.66 mg/dL (ref 0.40–1.20)
GFR: 88.49 mL/min (ref >60.00)
Glucose, Bld: 69 mg/dL — ABNORMAL LOW (ref 70–99)
Potassium: 3.7 mEq/L (ref 3.5–5.1)
Sodium: 143 mEq/L (ref 135–145)
Total Bilirubin: 0.4 mg/dL (ref 0.2–1.2)
Total Protein: 7.1 g/dL (ref 6.0–8.3)

## 2019-04-29 LAB — VITAMIN B12: Vitamin B-12: 222 pg/mL (ref 211–911)

## 2019-04-29 LAB — LIPID PANEL
Cholesterol: 210 mg/dL — ABNORMAL HIGH (ref 0–200)
HDL: 49.3 mg/dL (ref >39.00)
NonHDL: 160.9
Total CHOL/HDL Ratio: 4
Triglycerides: 226 mg/dL — ABNORMAL HIGH (ref 0.0–149.0)
VLDL: 45.2 mg/dL — ABNORMAL HIGH (ref 0.0–40.0)

## 2019-04-29 LAB — VITAMIN D 25 HYDROXY (VIT D DEFICIENCY, FRACTURES): VITD: 30.88 ng/mL (ref 30.00–100.00)

## 2019-04-29 LAB — LDL CHOLESTEROL, DIRECT: Direct LDL: 147 mg/dL

## 2019-04-30 ENCOUNTER — Other Ambulatory Visit: Payer: Self-pay

## 2019-04-30 ENCOUNTER — Encounter: Payer: Self-pay | Admitting: Family Medicine

## 2019-04-30 ENCOUNTER — Other Ambulatory Visit (INDEPENDENT_AMBULATORY_CARE_PROVIDER_SITE_OTHER): Payer: Medicare HMO

## 2019-04-30 DIAGNOSIS — R319 Hematuria, unspecified: Secondary | ICD-10-CM

## 2019-04-30 MED ORDER — ROSUVASTATIN CALCIUM 40 MG PO TABS: 40.0000 mg | ORAL_TABLET | Freq: Every day | ORAL | 1 refills | Status: DC

## 2019-04-30 NOTE — Addendum Note (Signed)
Addended by: Marian Sorrow on: 04/30/2019 04:01 PM   Modules accepted: Orders

## 2019-05-01 LAB — URINALYSIS, MICROSCOPIC ONLY

## 2019-05-05 ENCOUNTER — Other Ambulatory Visit: Payer: Self-pay | Admitting: Family Medicine

## 2019-05-05 DIAGNOSIS — Z1231 Encounter for screening mammogram for malignant neoplasm of breast: Secondary | ICD-10-CM

## 2019-06-02 ENCOUNTER — Other Ambulatory Visit: Payer: Self-pay | Admitting: Family Medicine

## 2019-06-09 ENCOUNTER — Other Ambulatory Visit: Payer: Self-pay | Admitting: Family Medicine

## 2019-06-17 ENCOUNTER — Other Ambulatory Visit: Payer: Self-pay

## 2019-06-17 ENCOUNTER — Other Ambulatory Visit: Payer: Self-pay | Admitting: Family Medicine

## 2019-06-17 ENCOUNTER — Ambulatory Visit
Admission: RE | Admit: 2019-06-17 | Discharge: 2019-06-17 | Disposition: A | Discharge status: Home / Self Care | Payer: Medicare HMO | Source: Ambulatory Visit | Attending: Family Medicine | Admitting: Family Medicine

## 2019-06-17 DIAGNOSIS — Z1231 Encounter for screening mammogram for malignant neoplasm of breast: Secondary | ICD-10-CM

## 2019-06-26 ENCOUNTER — Other Ambulatory Visit: Payer: Self-pay | Admitting: Family Medicine

## 2019-07-01 ENCOUNTER — Encounter: Payer: Self-pay | Admitting: Family Medicine

## 2019-07-03 ENCOUNTER — Encounter: Payer: Self-pay | Admitting: Family Medicine

## 2019-07-09 ENCOUNTER — Encounter: Payer: Self-pay | Admitting: Family Medicine

## 2019-07-10 ENCOUNTER — Telehealth (INDEPENDENT_AMBULATORY_CARE_PROVIDER_SITE_OTHER): Payer: Medicare HMO | Admitting: Family Medicine

## 2019-07-10 NOTE — Patient Instructions (Signed)
Health Maintenance Due  Topic Date Due  . INFLUENZA VACCINE  05/17/2019    - Flu shot today - High dose flu shot today - declines for this season - will complete later in flu season (please let us know if you get this at another location so we can update your chart)

## 2019-07-10 NOTE — Progress Notes (Signed)
Updating home blood pressure average-130/80 over last 7 readings

## 2019-07-23 DIAGNOSIS — R69 Illness, unspecified: Secondary | ICD-10-CM | POA: Diagnosis not present

## 2019-08-04 IMAGING — MG 2D DIGITAL DIAGNOSTIC UNILATERAL RIGHT MAMMOGRAM WITH CAD AND AD
9 series · 9 of 21 positions shown · non-contrast
Comparison: Previous exam(s).

CLINICAL DATA: 66-year-old female for evaluation of possible right
breast distortion and enlarged right axillary lymph node on
screening mammogram

EXAM:
2D DIGITAL DIAGNOSTIC RIGHT MAMMOGRAM WITH ADJUNCT TOMO
ULTRASOUND RIGHT BREAST

[R MLO synth-2D (1 of 2)]
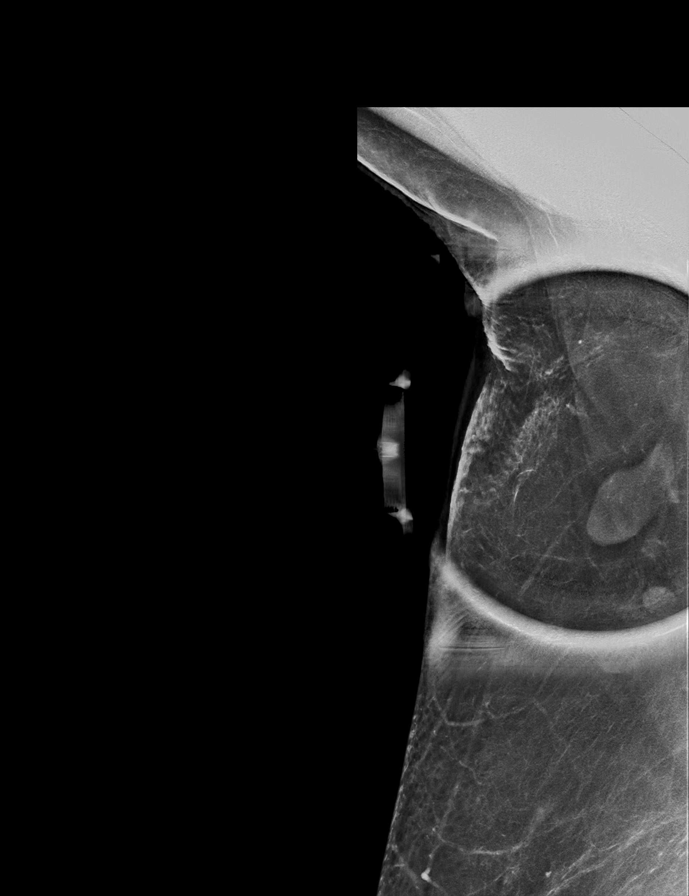

[R CC]
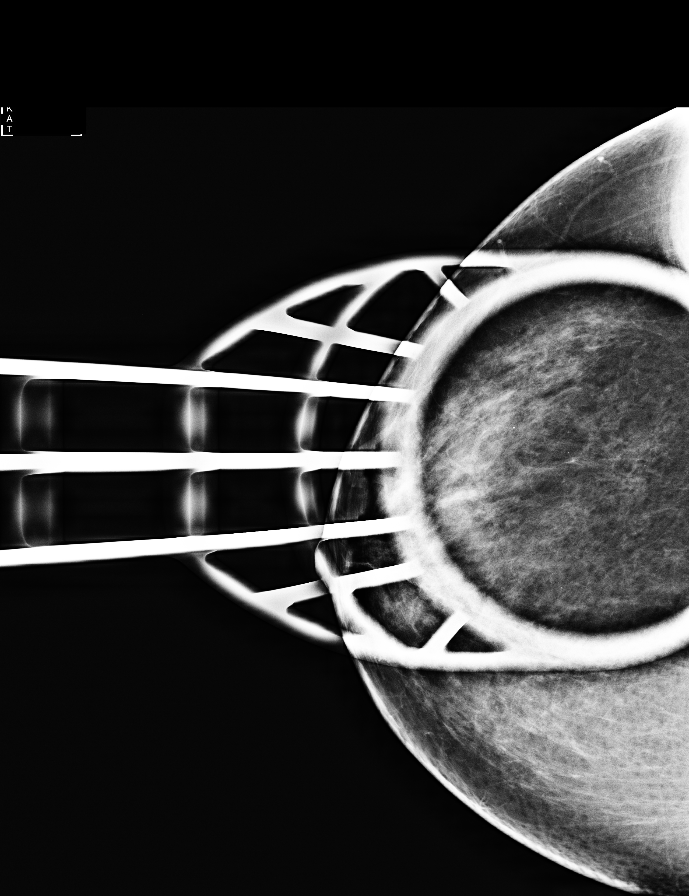

[R MLO (1 of 2)]
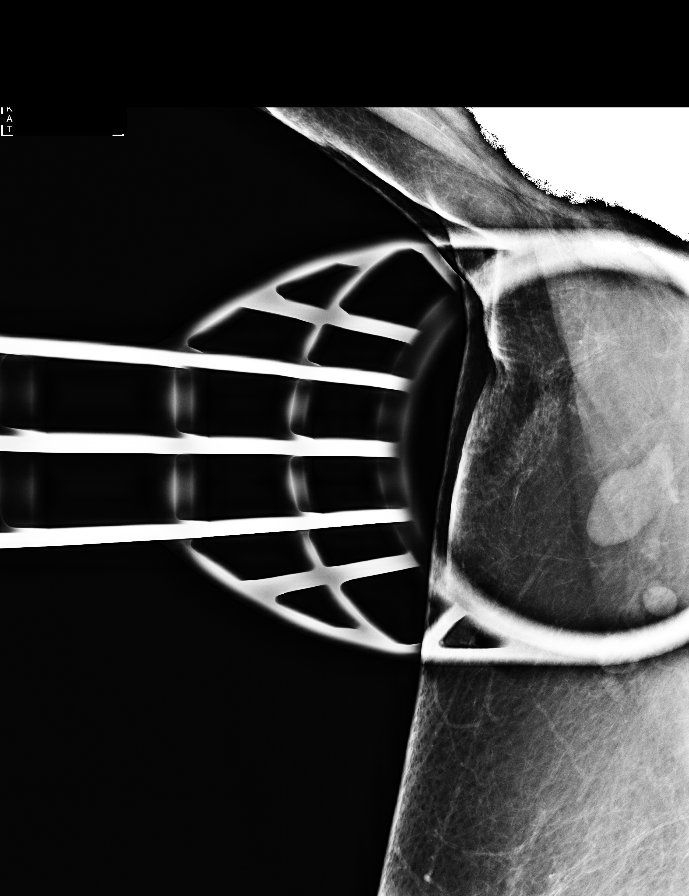

[R MLO synth-2D (2 of 2)]
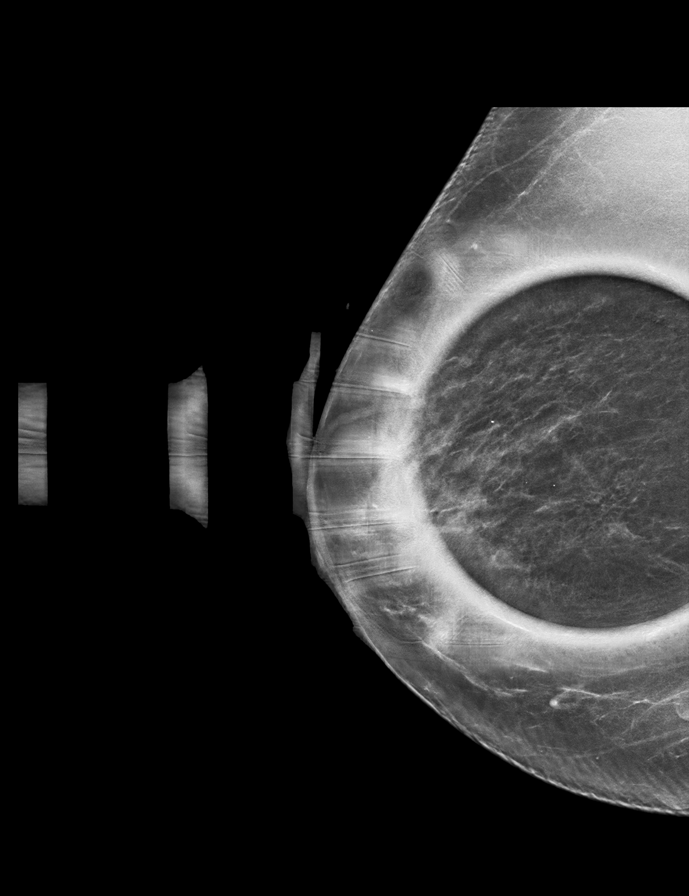

[R CC synth-2D]
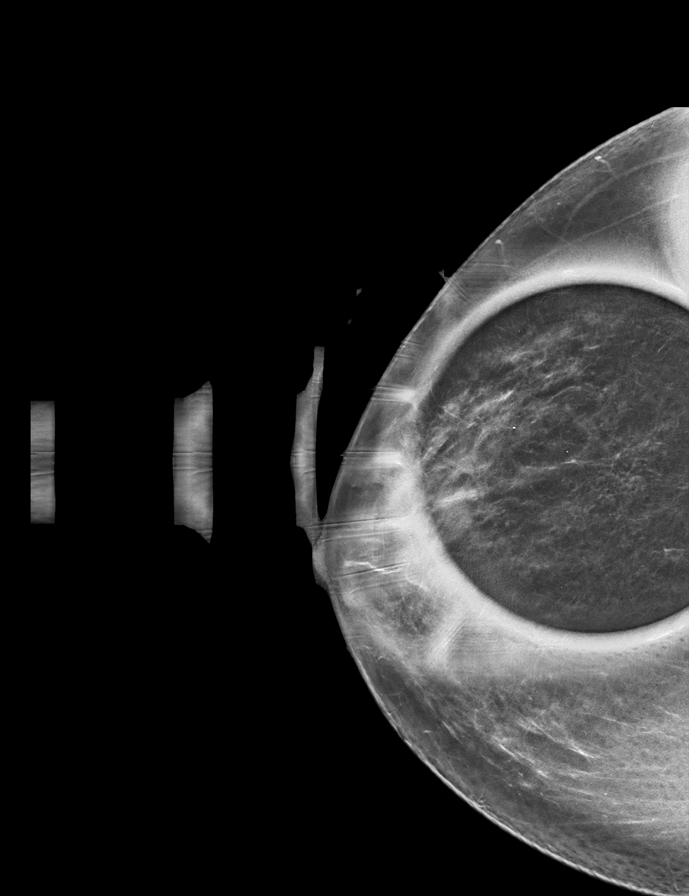

[R MLO (2 of 2)]
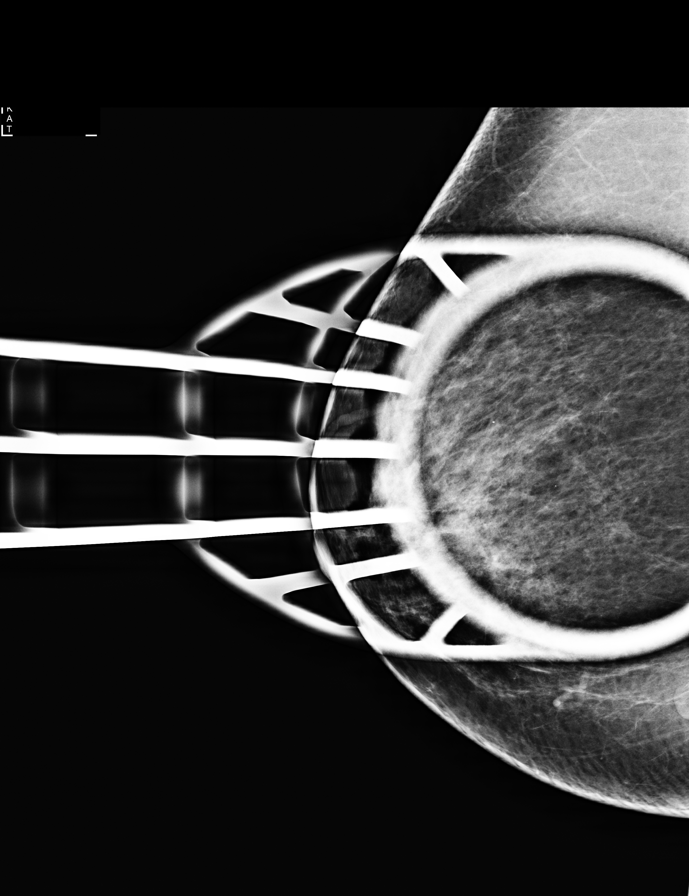

[R CC tomo · tomo slice 27/54.0]
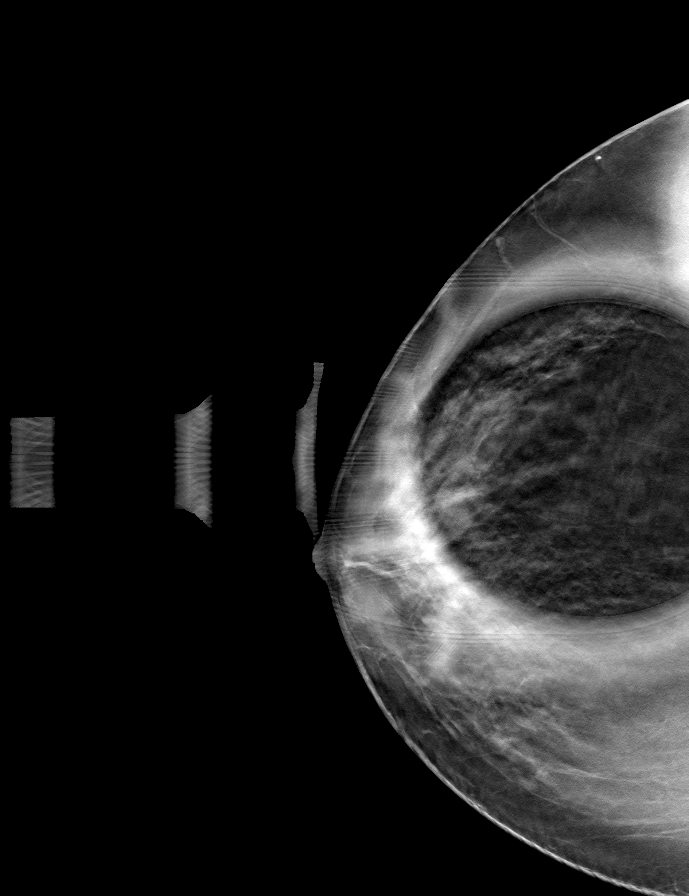

[R MLO tomo (1 of 2) · tomo slice 31/61.0]
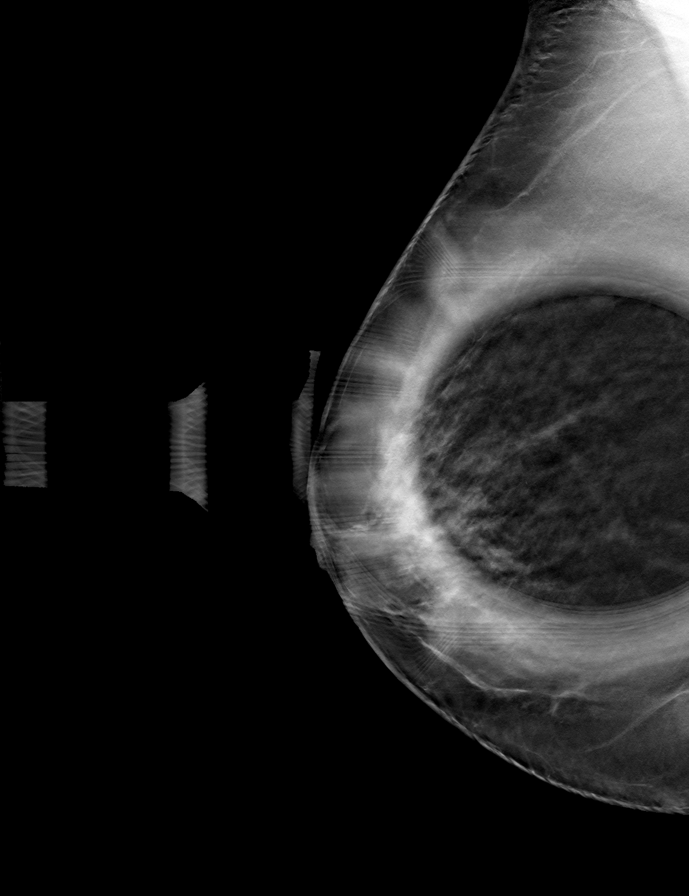

[R MLO tomo (2 of 2) · tomo slice 37/73.0]
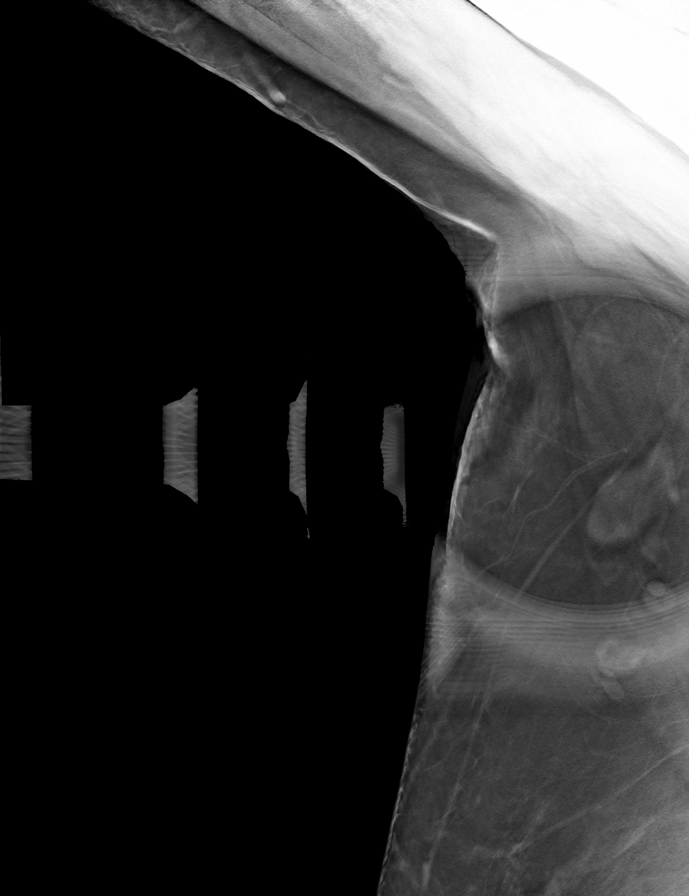

[9 of 21 positions shown; findings below may reference images not displayed]

ACR Breast Density Category b: There are scattered areas of
fibroglandular density.
FINDINGS: 2D and 3D spot compression views of the right breast demonstrate a
persistent area of distortion within the posterior slightly outer
right breast. Right axillary lymph nodes have a similar appearance
to remote studies.

Targeted ultrasound is performed, showing no definite sonographic
correlate to the distortion identified mammographically. No abnormal
right axillary lymph nodes are identified.
IMPRESSION: Right breast distortion -tissue sampling recommended.

No abnormal right axillary lymph nodes.

RECOMMENDATION:
Stereotactic/3D guided right breast biopsy, which will be arranged.

I have discussed the findings and recommendations with the patient.
Results were also provided in writing at the conclusion of the
visit. If applicable, a reminder letter will be sent to the patient
regarding the next appointment.

BI-RADS CATEGORY  4: Suspicious.

## 2019-08-12 ENCOUNTER — Other Ambulatory Visit: Payer: Self-pay | Admitting: Family Medicine

## 2019-08-15 ENCOUNTER — Encounter: Payer: Self-pay | Admitting: Family Medicine

## 2019-08-24 ENCOUNTER — Other Ambulatory Visit: Payer: Self-pay | Admitting: Family Medicine

## 2019-09-01 ENCOUNTER — Ambulatory Visit: Payer: Medicare HMO | Admitting: Family Medicine

## 2019-09-02 ENCOUNTER — Ambulatory Visit (INDEPENDENT_AMBULATORY_CARE_PROVIDER_SITE_OTHER): Payer: Medicare HMO | Admitting: Family Medicine

## 2019-09-02 ENCOUNTER — Other Ambulatory Visit: Payer: Self-pay

## 2019-09-02 ENCOUNTER — Encounter: Payer: Self-pay | Admitting: Family Medicine

## 2019-09-02 VITALS — BP 138/78 | HR 68 | Temp 98.2°F | Ht 61.0 in | Wt 165.8 lb

## 2019-09-02 DIAGNOSIS — E119 Type 2 diabetes mellitus without complications: Secondary | ICD-10-CM | POA: Diagnosis not present

## 2019-09-02 DIAGNOSIS — E1169 Type 2 diabetes mellitus with other specified complication: Secondary | ICD-10-CM

## 2019-09-02 DIAGNOSIS — F418 Other specified anxiety disorders: Secondary | ICD-10-CM

## 2019-09-02 DIAGNOSIS — E1159 Type 2 diabetes mellitus with other circulatory complications: Secondary | ICD-10-CM

## 2019-09-02 DIAGNOSIS — I10 Essential (primary) hypertension: Secondary | ICD-10-CM | POA: Diagnosis not present

## 2019-09-02 DIAGNOSIS — E785 Hyperlipidemia, unspecified: Secondary | ICD-10-CM

## 2019-09-02 DIAGNOSIS — I152 Hypertension secondary to endocrine disorders: Secondary | ICD-10-CM

## 2019-09-02 DIAGNOSIS — R69 Illness, unspecified: Secondary | ICD-10-CM | POA: Diagnosis not present

## 2019-09-02 LAB — TSH: TSH: 2 u[IU]/mL (ref 0.35–4.50)

## 2019-09-02 LAB — COMPREHENSIVE METABOLIC PANEL
ALT: 34 U/L (ref 0–35)
AST: 36 U/L (ref 0–37)
Albumin: 4.5 g/dL (ref 3.5–5.2)
Alkaline Phosphatase: 93 U/L (ref 39–117)
BUN: 17 mg/dL (ref 6–23)
CO2: 32 mEq/L (ref 19–32)
Calcium: 10.2 mg/dL (ref 8.4–10.5)
Chloride: 98 mEq/L (ref 96–112)
Creatinine, Ser: 0.78 mg/dL (ref 0.40–1.20)
GFR: 72.9 mL/min (ref >60.00)
Glucose, Bld: 182 mg/dL — ABNORMAL HIGH (ref 70–99)
Potassium: 3.8 mEq/L (ref 3.5–5.1)
Sodium: 140 mEq/L (ref 135–145)
Total Bilirubin: 0.4 mg/dL (ref 0.2–1.2)
Total Protein: 7.2 g/dL (ref 6.0–8.3)

## 2019-09-02 LAB — POCT GLYCOSYLATED HEMOGLOBIN (HGB A1C): Hemoglobin A1C: 7.1 % — AB (ref 4.0–5.6)

## 2019-09-02 LAB — LDL CHOLESTEROL, DIRECT: Direct LDL: 63 mg/dL

## 2019-09-02 NOTE — Patient Instructions (Addendum)
Health Maintenance Due  Topic Date Due  . INFLUENZA VACCINE -07/23/2019 05/17/2019  . FOOT EXAM -today 07/24/2019   Mild poor control on A1c today.  We discussed possibly increasing metformin or adding daily exercise at home- she would like to try to do 30 mins exercise per day and if unable to do that for any reason she will call us and we can increase metformin to 1000 mg in AM and 500 in PM    Recommended follow up: 73-month follow-up or sooner if needed  Please stop by lab before you go If you do not have mychart- we will call you about results within 5 business days of [place] receiving them.  If you have mychart- we will send your results within 3 business days of [place] receiving them.  If abnormal or we want to clarify a result, we will call or mychart you to make sure you receive the message.  If you have questions or concerns or don't hear within 5-7 days, please send [place] a message or call us.

## 2019-09-02 NOTE — Progress Notes (Signed)
Phone [phone] In person visit   Subjective:   Candice Guerrero is a 68 y.o. year old very pleasant female patient who presents for/with See problem oriented charting Chief Complaint  Patient presents with  . Follow-up  . Diabetes  . Hyperlipidemia  . Hypertension   ROS-  No chest pain or shortness of breath. No headache or blurry vision.   Past Medical History-  Patient Active Problem List   Diagnosis Date Noted  . Diabetes mellitus type II, controlled, with no complications ([place]) 123456    Priority: High  . IBS (irritable bowel syndrome) 07/24/2014    Priority: Medium  . Psoriasis 12/13/2010    Priority: Medium  . Hypertension associated with diabetes ([place]) 12/13/2010    Priority: Medium  . Depression with anxiety 12/13/2010    Priority: Medium  . Fatty liver 12/13/2010    Priority: Medium  . Hyperlipidemia associated with type 2 diabetes mellitus ([place]) 12/13/2010    Priority: Medium  . Acute midline low back pain with left-sided sciatica 11/06/2018    Priority: Low  . Abnormal mammogram of right breast 07/18/2017    Priority: Low  . Osteopenia 07/24/2014    Priority: Low  . Allergic rhinitis 07/24/2014    Priority: Low  . Obesity (BMI 30-39.9) 12/31/2013    Priority: Low  . Benign hematuria 12/13/2010    Priority: Low  . GERD (gastroesophageal reflux disease) 12/13/2010    Priority: Low    Medications- reviewed and updated Current Outpatient Medications  Medication Sig Dispense Refill  . aspirin 81 MG tablet Take 81 mg by mouth daily.      Marland Kitchen aspirin-acetaminophen-caffeine (EXCEDRIN MIGRAINE) 250-250-65 MG per tablet Take 1 tablet by mouth every 6 (six) hours as needed. Reported on 10/21/2015    . calcipotriene-betamethasone (TACLONEX) ointment Apply topically daily. 60 g 0  . Calcium Carbonate (CALTRATE 600) 1500 MG TABS Take by mouth daily.      . DULoxetine (CYMBALTA) 20 MG capsule TAKE 1 CAPSULE BY MOUTH EVERY DAY 90 capsule 2  . gabapentin  (NEURONTIN) 300 MG capsule Take 1 capsule, po, up to TID prn or as directed 270 capsule 0  . metFORMIN (GLUCOPHAGE) 500 MG tablet TAKE 1 TABLET TWICE DAILY  WITH MEALS 180 tablet 3  . omeprazole (PRILOSEC) 40 MG capsule TAKE 1 CAPSULE DAILY 90 capsule 3  . rosuvastatin (CRESTOR) 40 MG tablet TAKE 1 TABLET BY MOUTH EVERY DAY 90 tablet 1  . valsartan-hydrochlorothiazide (DIOVAN-HCT) 80-12.5 MG tablet TAKE 1 TABLET DAILY 90 tablet 3  . Vitamin D, Ergocalciferol, (DRISDOL) 1.25 MG (50000 UT) CAPS capsule TAKE 1 CAPSULE ONCE EVERY 7 DAYS 8 capsule 0   No current facility-administered medications for this visit.      Objective:  BP 138/78   Pulse 68   Temp 98.2 F (36.8 C)   Ht 5\' 1"  (1.549 m)   Wt 165 lb 12.8 oz (75.2 kg)   LMP  (LMP Unknown)   SpO2 100%   BMI 31.33 kg/m  Gen: NAD, resting comfortably CV: RRR no murmurs rubs or gallops Lungs: CTAB no crackles, wheeze, rhonchi Abdomen: soft/nontender/nondistended/normal bowel sounds.  Ext: no edema Skin: warm, dry  Diabetic Foot Exam - Simple   Simple Foot Form Diabetic Foot exam was performed with the following findings: Yes 09/02/2019 10:40 AM  Visual Inspection No deformities, no ulcerations, no other skin breakdown bilaterally: Yes Sensation Testing Intact to touch and monofilament testing bilaterally: Yes Pulse Check Posterior Tibialis and Dorsalis pulse intact bilaterally:  Yes Comments       Assessment and Plan   # misses family in [place]- has been over a year and very tough on her. Masks fog up glasses  #hypertension S: compliant with Valsartan-HCTZ 80-12.5mg , pt states diet and exercise are not as good as she would like.  BP Readings from Last 3 Encounters:  09/02/19 138/78  07/10/19 130/80  04/28/19 (!) 142/88  A/P: Good control/stable. Continue current medications.  May come down some with adding exercise as below.   # Diabetes S: Compliant with metformin 500mg  twice a day  CBGs- blood sugars are good,  no low blood sugars  Exercise and diet- not so good lately. Weight down 3 lbs but has not felt great going back to the gym having to wear her mask there. Has tried to cut down on portion size. Tries to be active at home.  Lab Results  Component Value Date   HGBA1C 7.1 (A) point-of-care 09/02/2019   HGBA1C 7.0 (A) 04/28/2019   HGBA1C 6.5 (A) 07/23/2018   A/P: Mild poor control on A1c today.  We discussed possibly increasing metformin or adding daily exercise at home- she would like to try to do 30 mins exercise per day and if unable to do that for any reason she will call us and we can increase metformin to 1000 mg in AM and 500 in PM   - some pain in feet at night prickly on top of foot- discussed possible early neuropathy. Has some old gabapentin she could try for this.   #hyperlipidemia S: compliant with Crestor 40mg .  Last labs were on atorvastatin 40 mg. No side effects Lab Results  Component Value Date   CHOL 210 (H) 04/28/2019   HDL 49.30 04/28/2019   LDLCALC 117 (H) 08/17/2016   LDLDIRECT 147.0 04/28/2019   TRIG 226.0 (H) 04/28/2019   CHOLHDL 4 04/28/2019   A/P: Hopefully improved control on rosuvastatin 40 mg-update direct LDL in target at least under 100-consider Zetia if not at goal -check TSh as states somewhat sweaty/heat intolerance/mainly sweats from head  # Depression S: compliatn with Cymbalta 20mg , pt denies any flare ups Depression screen [facility] 2/9 09/02/2019  Decreased Interest 0  Down, Depressed, Hopeless 0  PHQ - 2 Score 0  Altered sleeping 1  Tired, decreased energy 1  Change in appetite 0  Feeling bad or failure about yourself  0  Trouble concentrating 0  Moving slowly or fidgety/restless 0  Suicidal thoughts 0  PHQ-9 Score 2  Difficult doing work/chores Not difficult at all  A/P: Stable in full remission. Continue current medications.     Recommended follow up: 30-month follow-up or sooner if needed Future Appointments  Date Time Provider [place]  06/22/2020 11:00 AM [facility] 3 [facility]    Lab/Order associations:   ICD-10-CM   1. Controlled type 2 diabetes mellitus without complication, without long-term current use of insulin (HCC)  E11.9 POCT HgB A1C  2. Hypertension associated with diabetes ([place])  E11.59    I10   3. Hyperlipidemia associated with type 2 diabetes mellitus ([place])  E11.69    E78.5   4. Depression with anxiety  F41.8     No orders of the defined types were placed in this encounter.   Return precautions advised.  [name], MD

## 2019-09-04 DIAGNOSIS — Z86006 Personal history of melanoma in-situ: Secondary | ICD-10-CM | POA: Diagnosis not present

## 2019-09-04 DIAGNOSIS — Z23 Encounter for immunization: Secondary | ICD-10-CM | POA: Diagnosis not present

## 2019-09-04 DIAGNOSIS — L400 Psoriasis vulgaris: Secondary | ICD-10-CM | POA: Diagnosis not present

## 2019-09-04 DIAGNOSIS — D225 Melanocytic nevi of trunk: Secondary | ICD-10-CM | POA: Diagnosis not present

## 2019-09-04 DIAGNOSIS — L821 Other seborrheic keratosis: Secondary | ICD-10-CM | POA: Diagnosis not present

## 2019-09-04 DIAGNOSIS — D2271 Melanocytic nevi of right lower limb, including hip: Secondary | ICD-10-CM | POA: Diagnosis not present

## 2019-09-04 DIAGNOSIS — D2272 Melanocytic nevi of left lower limb, including hip: Secondary | ICD-10-CM | POA: Diagnosis not present

## 2019-09-04 DIAGNOSIS — L814 Other melanin hyperpigmentation: Secondary | ICD-10-CM | POA: Diagnosis not present

## 2019-09-19 ENCOUNTER — Encounter: Payer: Self-pay | Admitting: Family Medicine

## 2019-09-21 ENCOUNTER — Other Ambulatory Visit: Payer: Self-pay | Admitting: Family Medicine

## 2019-10-01 ENCOUNTER — Encounter: Payer: Self-pay | Admitting: Family Medicine

## 2019-10-01 MED ORDER — DULOXETINE HCL 20 MG PO CSDR: 20.0000 mg | DELAYED_RELEASE_CAPSULE | Freq: Every day | ORAL | 3 refills | Status: DC

## 2019-10-24 ENCOUNTER — Other Ambulatory Visit: Payer: Self-pay | Admitting: Family Medicine

## 2019-10-31 ENCOUNTER — Encounter: Payer: Self-pay | Admitting: Family Medicine

## 2019-11-02 ENCOUNTER — Encounter: Payer: Self-pay | Admitting: Family Medicine

## 2019-11-07 ENCOUNTER — Ambulatory Visit: Payer: Medicare HMO | Attending: Internal Medicine

## 2019-11-07 DIAGNOSIS — Z23 Encounter for immunization: Secondary | ICD-10-CM | POA: Insufficient documentation

## 2019-11-07 NOTE — Progress Notes (Signed)
   Covid-19 Vaccination Clinic  Name:  Candice Guerrero    MRN: [id number] DOB: 08-May-1951  11/07/2019  [name] was observed post Covid-19 immunization for 15 minutes without incidence. She was provided with Vaccine Information Sheet and instruction to access the V-Safe system.   [name] was instructed to call 911 with any severe reactions post vaccine: Marland Kitchen Difficulty breathing  . Swelling of your face and throat  . A fast heartbeat  . A bad rash all over your body  . Dizziness and weakness    Immunizations Administered    Name Date Dose VIS Date Route   Pfizer COVID-19 Vaccine 11/07/2019 11:15 AM 0.3 mL 09/26/2019 Intramuscular   Manufacturer: [place]   Lot: [id number]   [place]: [id number]

## 2019-11-13 ENCOUNTER — Other Ambulatory Visit: Payer: Self-pay | Admitting: Family Medicine

## 2019-11-28 ENCOUNTER — Other Ambulatory Visit: Payer: Self-pay

## 2019-11-28 ENCOUNTER — Ambulatory Visit: Payer: Medicare HMO | Attending: Internal Medicine

## 2019-11-28 DIAGNOSIS — Z23 Encounter for immunization: Secondary | ICD-10-CM | POA: Insufficient documentation

## 2019-11-28 NOTE — Progress Notes (Signed)
   Covid-19 Vaccination Clinic  Name:  Candice Guerrero    MRN: [id number] DOB: 1951-02-05  11/28/2019  Candice Guerrero was observed post Covid-19 immunization for 15 minutes without incidence. She was provided with Vaccine Information Sheet and instruction to access the V-Safe system.   Candice Guerrero was instructed to call 911 with any severe reactions post vaccine: Marland Kitchen Difficulty breathing  . Swelling of your face and throat  . A fast heartbeat  . A bad rash all over your body  . Dizziness and weakness    Immunizations Administered    Name Date Dose VIS Date Route   Pfizer COVID-19 Vaccine 11/28/2019  2:56 PM 0.3 mL 09/26/2019 Intramuscular   Manufacturer: [place]   Lot: [id number]   [place]: [id number]

## 2019-12-02 DIAGNOSIS — R220 Localized swelling, mass and lump, head: Secondary | ICD-10-CM | POA: Diagnosis not present

## 2019-12-02 DIAGNOSIS — J3489 Other specified disorders of nose and nasal sinuses: Secondary | ICD-10-CM | POA: Diagnosis not present

## 2019-12-02 DIAGNOSIS — K219 Gastro-esophageal reflux disease without esophagitis: Secondary | ICD-10-CM | POA: Diagnosis not present

## 2019-12-25 ENCOUNTER — Ambulatory Visit: Payer: Medicare HMO | Admitting: Family Medicine

## 2019-12-25 ENCOUNTER — Ambulatory Visit: Payer: Self-pay

## 2019-12-25 ENCOUNTER — Ambulatory Visit (INDEPENDENT_AMBULATORY_CARE_PROVIDER_SITE_OTHER): Payer: Medicare HMO

## 2019-12-25 ENCOUNTER — Encounter: Payer: Self-pay | Admitting: Family Medicine

## 2019-12-25 ENCOUNTER — Other Ambulatory Visit: Payer: Self-pay

## 2019-12-25 VITALS — BP 142/92 | HR 92 | Ht 61.0 in | Wt 165.0 lb

## 2019-12-25 DIAGNOSIS — M76892 Other specified enthesopathies of left lower limb, excluding foot: Secondary | ICD-10-CM

## 2019-12-25 DIAGNOSIS — G8929 Other chronic pain: Secondary | ICD-10-CM

## 2019-12-25 DIAGNOSIS — M25562 Pain in left knee: Secondary | ICD-10-CM

## 2019-12-25 DIAGNOSIS — M1712 Unilateral primary osteoarthritis, left knee: Secondary | ICD-10-CM | POA: Diagnosis not present

## 2019-12-25 MED ORDER — GABAPENTIN 100 MG PO CAPS: 200.0000 mg | ORAL_CAPSULE | Freq: Every day | ORAL | 3 refills | Status: DC

## 2019-12-25 NOTE — Progress Notes (Signed)
[facility] [address] [place] Phone: [phone] Subjective:   I Candice Guerrero am serving as a Education administrator for Dr. Hulan Saas.  This visit occurred during the SARS-CoV-2 public health emergency.  Safety protocols were in place, including screening questions prior to the visit, additional usage of staff PPE, and extensive cleaning of exam room while observing appropriate contact time as indicated for disinfecting solutions.   I'm seeing this patient by the request  of:  [name], MD  CC: Left knee pain  [id number]   02/17/2019 Patient has some lumbar radiculopathy.  Had attempted gabapentin previously but this was at 300 mg.  Was unable to tolerate it.  We started 200 mg at night.  Transitioning patient Celexa to Cymbalta.  Warned of potential side effects but I think it could be beneficial.  Patient was given a 5-day prescription for the tramadol.  We discussed of this was going to be continuous and a pain medicine contract would be necessary.  Patient did not want to do that at this moment and I have encouraged her to try another nerve root injection.  This was ordered today.  Secondary to coronavirus outbreak may be delayed.  We will consider another short-term course of tramadol if necessary but would like patient to avoid it and when taking the tramadol I will take it with Tylenol for synergistic effect.  Patient is in agreement with the plan.  Update 12/25/2019 Candice Guerrero is a 69 y.o. female coming in with complaint of left knee pain. Patient last seen for lumbar radiculopathy. Patient states the knee is not doing well. Patient states that she has medial knee pain. Pain with walking, sitting to standing position and other ADLs. Had her second covid shot (Feb. 12) and noticed about 2am she was having cold sweats, headache and her body was achy. States the knee was very painful at the time. Some sciatica pain that causes her foot to  tingle. The sciatica keeps her up at night. Knee pain is throbbing. States she sleeps in the fetal position so extension first thing in the morning is painful. Patient has tried ice, heating pad and topical medications.  6/10 at its worse. Pain is worse with a lot of sitting and waking up in the morning.          Past Medical History:  Diagnosis Date  . Abnormal mammogram of right breast 07/18/2017  . Allergy    SEASONAL  . Benign hematuria   . Cataract    LEFT EYE  . Dental crowns present   . Depression   . Diabetes mellitus    NIDDM  . GERD (gastroesophageal reflux disease)   . Headache(784.0)    migraines, sinus  . Hiatal hernia   . Hyperlipidemia   . Hypertension    under control, has been on med. since 2005  . Osteopenia   . Psoriasis    arms, legs, back  . Snores    denies sx. of sleep apnea   Past Surgical History:  Procedure Laterality Date  . BREAST LUMPECTOMY WITH RADIOACTIVE SEED LOCALIZATION Right 07/18/2017   Procedure: RIGHT BREAST LUMPECTOMY WITH RADIOACTIVE SEED LOCALIZATION;  Surgeon: Fanny Skates, MD;  Location: [facility];  Service: General;  Laterality: Right;  . COLONOSCOPY    . DILATION AND CURETTAGE OF UTERUS    . HARDWARE REMOVAL  07/11/2012   Procedure: HARDWARE REMOVAL;  Surgeon: Cammie Sickle., MD;  Location:  [facility];  Service: Orthopedics;  Laterality: Left;  pin removal wire out and debride pisiform  . left wrist surgery     July 9th 2013  . TONSILLECTOMY    . TOOTH EXTRACTION Right 12/13/2016   Procedure: SURGICAL REMOVAL OF ODONTOGENIC TUMOR AND REMOVAL OF IMPACTED TOOTH #1;  Surgeon: Jannette Fogo, DDS;  Location: [facility];  Service: Oral Surgery;  Laterality: Right;  . TRIGGER FINGER RELEASE  07/11/2012   Procedure: RELEASE TRIGGER FINGER/A-1 PULLEY;  Surgeon: Cammie Sickle., MD;  Location: [facility];  Service: Orthopedics;  Laterality: Left;  LEFT LONG FINGER    Social History   Socioeconomic History  . Marital status: Married    Spouse name: Not on file  . Number of children: Not on file  . Years of education: Not on file  . Highest education level: Not on file  Occupational History  . Not on file  Tobacco Use  . Smoking status: Former Smoker    Packs/day: 0.10    Years: 20.00    Pack years: 2.00    Types: Cigarettes    Quit date: 10/16/1998    Years since quitting: 21.2  . Smokeless tobacco: Never Used  . Tobacco comment: quit smoking in 2000  Substance and Sexual Activity  . Alcohol use: Yes    Comment: socially, weekends  . Drug use: No  . Sexual activity: Yes  Other Topics Concern  . Not on file  Social History Narrative   Moved to [facility] in 2011 from [place] due to more affordable.       Married 1972 Mortimer Fries). 2 kids. 5 grandkids. 2 in [place], 3 [place].       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       Hobbies: babysitting, casino   Social Determinants of Health   Financial Resource Strain:   . Difficulty of Paying Living Expenses:   Food Insecurity:   . Worried About Charity fundraiser in the Last Year:   . Arboriculturist in the Last Year:   Transportation Needs:   . Film/video editor (Medical):   Marland Kitchen Lack of Transportation (Non-Medical):   Physical Activity:   . Days of Exercise per Week:   . Minutes of Exercise per Session:   Stress:   . Feeling of Stress :   Social Connections:   . Frequency of Communication with Friends and Family:   . Frequency of Social Gatherings with Friends and Family:   . Attends Religious Services:   . Active Member of Clubs or Organizations:   . Attends Archivist Meetings:   Marland Kitchen Marital Status:    Allergies  Allergen Reactions  . Oxycodone Nausea Only  . Altace [Ramipril] Cough   Family History  Problem Relation Age of Onset  . Hyperlipidemia Mother   . Hypertension Mother   . Hypertension Father   . Kidney disease  Father   . Anesthesia problems Sister        post-op N/V, hard to wake up post-op  . Diabetes Maternal Uncle     Current Outpatient Medications (Endocrine & Metabolic):  .  metFORMIN (GLUCOPHAGE) 500 MG tablet, TAKE 1 TABLET TWICE DAILY  WITH MEALS  Current Outpatient Medications (Cardiovascular):  .  rosuvastatin (CRESTOR) 40 MG tablet, TAKE 1 TABLET BY MOUTH EVERY DAY .  valsartan-hydrochlorothiazide (DIOVAN-HCT) 80-12.5 MG tablet, TAKE 1 TABLET DAILY   Current  Outpatient Medications (Analgesics):  .  aspirin 81 MG tablet, Take 81 mg by mouth daily.   Marland Kitchen  aspirin-acetaminophen-caffeine (EXCEDRIN MIGRAINE) 250-250-65 MG per tablet, Take 1 tablet by mouth every 6 (six) hours as needed. Reported on 10/21/2015   Current Outpatient Medications (Other):  .  calcipotriene-betamethasone (TACLONEX) ointment, Apply topically daily. .  Calcium Carbonate (CALTRATE 600) 1500 MG TABS, Take by mouth daily.   .  DULoxetine (CYMBALTA) 20 MG capsule, TAKE 1 CAPSULE BY MOUTH EVERY DAY .  gabapentin (NEURONTIN) 300 MG capsule, Take 1 capsule, po, up to TID prn or as directed .  omeprazole (PRILOSEC) 40 MG capsule, TAKE 1 CAPSULE DAILY .  Vitamin D, Ergocalciferol, (DRISDOL) 1.25 MG (50000 UT) CAPS capsule, TAKE 1 CAPSULE ONCE EVERY 7 DAYS .  gabapentin (NEURONTIN) 100 MG capsule, Take 2 capsules (200 mg total) by mouth at bedtime.   Reviewed prior external information including notes and imaging from  primary care provider As well as notes that were available from care everywhere and other healthcare systems.  Past medical history, social, surgical and family history all reviewed in electronic medical record.  No pertanent information unless stated regarding to the chief complaint.   Review of Systems:  No headache, visual changes, nausea, vomiting, diarrhea, constipation, dizziness, abdominal pain, skin rash, fevers, chills, night sweats, weight loss, swollen lymph nodes, body aches, joint swelling,  chest pain, shortness of breath, mood changes. POSITIVE muscle aches  Objective  Blood pressure (!) 142/92, pulse 92, height 5\' 1"  (1.549 m), weight 165 lb (74.8 kg), SpO2 98 %.   General: No apparent distress alert and oriented x3 mood and affect normal, dressed appropriately.  HEENT: Pupils equal, extraocular movements intact  Respiratory: Patient's speak in full sentences and does not appear short of breath  Cardiovascular: No lower extremity edema, non tender, no erythema  Skin: Warm dry intact with no signs of infection or rash on extremities or on axial skeleton.  Abdomen: Soft nontender  Neuro: Cranial nerves II through XII are intact, neurovascularly intact in all extremities with 2+ DTRs and 2+ pulses.  Lymph: No lymphadenopathy of posterior or anterior cervical chain or axillae bilaterally.  Gait normal with good balance and coordination.  MSK: Mild arthritic changes of multiple joints.   left knee does have tenderness to palpation of the medial joint line.  Moderate arthritic changes but no significant instability.  Patient does have near full rang left knee left knee 5 out of 10 of motion.  Contralateral knee unremarkable  Limited musculoskeletal ultrasound was performed and interpreted by Lyndal Pulley  Limited ultrasound of patient's knee shows the patient does have moderate arthritic changes of the patellofemoral and the medial joint space.  There are some degenerative changes of the meniscus of the medial side noted but not any displacement.  Patient does have though a bursitis of the hamstring tendon noted.  No intrasubstance tearing though noted. Impression: hamstring tendonitis with bursitis distally and medially     Impression and Recommendations:     This case required medical decision making of moderate complexity. The above documentation has been reviewed and is accurate and complete Lyndal Pulley, DO       Note: This dictation was prepared with Dragon  dictation along with smaller phrase technology. Any transcriptional errors that result from this process are unintentional.

## 2019-12-25 NOTE — Patient Instructions (Addendum)
Good to see you Thigh compression Pennsaid small amount on the most painful spot up to twice a day Gabapentin 200 mg at night Exercise 3 times a week See me again in 5-6 weeks

## 2019-12-25 NOTE — Assessment & Plan Note (Signed)
Hamstring tendinitis with some mild inflammation noted.  Patient does have some underlying arthritis of the knee and we will get x-ray to further evaluate but it does appear to be more of the hamstring itself.  Discussed thigh compression sleeve, restart gabapentin in case there is some lumbar radiculopathy that is contributing as well and will help with some of the sleep time.  Discussed topical anti-inflammatories.  Follow-up again in 5 to 6 weeks.  Worsening symptoms consider formal physical therapy.

## 2019-12-30 ENCOUNTER — Other Ambulatory Visit: Payer: Self-pay

## 2019-12-30 ENCOUNTER — Encounter: Payer: Self-pay | Admitting: Family Medicine

## 2019-12-30 ENCOUNTER — Ambulatory Visit (INDEPENDENT_AMBULATORY_CARE_PROVIDER_SITE_OTHER): Payer: Medicare HMO | Admitting: Family Medicine

## 2019-12-30 VITALS — BP 124/74 | HR 77 | Temp 98.0°F | Ht 61.0 in | Wt 164.2 lb

## 2019-12-30 DIAGNOSIS — E119 Type 2 diabetes mellitus without complications: Secondary | ICD-10-CM

## 2019-12-30 DIAGNOSIS — I10 Essential (primary) hypertension: Secondary | ICD-10-CM | POA: Diagnosis not present

## 2019-12-30 DIAGNOSIS — I152 Hypertension secondary to endocrine disorders: Secondary | ICD-10-CM

## 2019-12-30 DIAGNOSIS — E1169 Type 2 diabetes mellitus with other specified complication: Secondary | ICD-10-CM

## 2019-12-30 DIAGNOSIS — E785 Hyperlipidemia, unspecified: Secondary | ICD-10-CM

## 2019-12-30 DIAGNOSIS — E1159 Type 2 diabetes mellitus with other circulatory complications: Secondary | ICD-10-CM | POA: Diagnosis not present

## 2019-12-30 DIAGNOSIS — M436 Torticollis: Secondary | ICD-10-CM | POA: Diagnosis not present

## 2019-12-30 LAB — CBC WITH DIFFERENTIAL/PLATELET
Basophils Absolute: 0 10*3/uL (ref 0.0–0.1)
Basophils Relative: 0.6 % (ref 0.0–3.0)
Eosinophils Absolute: 0.1 10*3/uL (ref 0.0–0.7)
Eosinophils Relative: 1 % (ref 0.0–5.0)
HCT: 42.8 % (ref 36.0–46.0)
Hemoglobin: 13.7 g/dL (ref 12.0–15.0)
Lymphocytes Relative: 36.2 % (ref 12.0–46.0)
Lymphs Abs: 2.6 10*3/uL (ref 0.7–4.0)
MCHC: 31.9 g/dL (ref 30.0–36.0)
MCV: 85.1 fl (ref 78.0–100.0)
Monocytes Absolute: 0.8 10*3/uL (ref 0.1–1.0)
Monocytes Relative: 11.3 % (ref 3.0–12.0)
Neutro Abs: 3.7 10*3/uL (ref 1.4–7.7)
Neutrophils Relative %: 50.9 % (ref 43.0–77.0)
Platelets: 313 10*3/uL (ref 150.0–400.0)
RBC: 5.03 Mil/uL (ref 3.87–5.11)
RDW: 14.7 % (ref 11.5–15.5)
WBC: 7.2 10*3/uL (ref 4.0–10.5)

## 2019-12-30 LAB — COMPREHENSIVE METABOLIC PANEL
ALT: 21 U/L (ref 0–35)
AST: 23 U/L (ref 0–37)
Albumin: 4.1 g/dL (ref 3.5–5.2)
Alkaline Phosphatase: 93 U/L (ref 39–117)
BUN: 17 mg/dL (ref 6–23)
CO2: 31 mEq/L (ref 19–32)
Calcium: 9.6 mg/dL (ref 8.4–10.5)
Chloride: 100 mEq/L (ref 96–112)
Creatinine, Ser: 0.78 mg/dL (ref 0.40–1.20)
GFR: 72.83 mL/min (ref >60.00)
Glucose, Bld: 135 mg/dL — ABNORMAL HIGH (ref 70–99)
Potassium: 4.1 mEq/L (ref 3.5–5.1)
Sodium: 139 mEq/L (ref 135–145)
Total Bilirubin: 0.4 mg/dL (ref 0.2–1.2)
Total Protein: 6.9 g/dL (ref 6.0–8.3)

## 2019-12-30 LAB — HEMOGLOBIN A1C: Hgb A1c MFr Bld: 7.7 % — ABNORMAL HIGH (ref 4.6–6.5)

## 2019-12-30 LAB — TSH: TSH: 2 u[IU]/mL (ref 0.35–4.50)

## 2019-12-30 LAB — LDL CHOLESTEROL, DIRECT: Direct LDL: 58 mg/dL

## 2019-12-30 MED ORDER — ROSUVASTATIN CALCIUM 40 MG PO TABS: 40.0000 mg | ORAL_TABLET | Freq: Every day | ORAL | 3 refills | Status: DC

## 2019-12-30 NOTE — Progress Notes (Signed)
Phone [phone] In person visit   Subjective:   Candice Guerrero is a 69 y.o. year old very pleasant female patient who presents for/with See problem oriented charting Chief Complaint  Patient presents with  . Hypertension  . Hyperlipidemia  . Diabetes  . Depression   This visit occurred during the SARS-CoV-2 public health emergency.  Safety protocols were in place, including screening questions prior to the visit, additional usage of staff PPE, and extensive cleaning of exam room while observing appropriate contact time as indicated for disinfecting solutions.   Past Medical History-  Patient Active Problem List   Diagnosis Date Noted  . Diabetes mellitus type II, controlled, with no complications ([place]) [id number]    Priority: High  . IBS (irritable bowel syndrome) 07/24/2014    Priority: Medium  . Psoriasis 12/13/2010    Priority: Medium  . Hypertension associated with diabetes ([place]) 12/13/2010    Priority: Medium  . Depression with anxiety 12/13/2010    Priority: Medium  . Fatty liver 12/13/2010    Priority: Medium  . Hyperlipidemia associated with type 2 diabetes mellitus ([place]) 12/13/2010    Priority: Medium  . Acute midline low back pain with left-sided sciatica 11/06/2018    Priority: Low  . Abnormal mammogram of right breast 07/18/2017    Priority: Low  . Osteopenia 07/24/2014    Priority: Low  . Allergic rhinitis 07/24/2014    Priority: Low  . Obesity (BMI 30-39.9) 12/31/2013    Priority: Low  . Benign hematuria 12/13/2010    Priority: Low  . GERD (gastroesophageal reflux disease) 12/13/2010    Priority: Low  . Hamstring tendinitis of left thigh 12/25/2019    Medications- reviewed and updated Current Outpatient Medications  Medication Sig Dispense Refill  . aspirin 81 MG tablet Take 81 mg by mouth daily.      Marland Kitchen aspirin-acetaminophen-caffeine (EXCEDRIN MIGRAINE) 250-250-65 MG per tablet Take 1 tablet by mouth every 6 (six) hours as needed. Reported on  10/21/2015    . calcipotriene-betamethasone (TACLONEX) ointment Apply topically daily. 60 g 0  . Calcium Carbonate (CALTRATE 600) 1500 MG TABS Take by mouth daily.      . DULoxetine (CYMBALTA) 20 MG capsule TAKE 1 CAPSULE BY MOUTH EVERY DAY 90 capsule 2  . gabapentin (NEURONTIN) 100 MG capsule Take 2 capsules (200 mg total) by mouth at bedtime. 180 capsule 3  . metFORMIN (GLUCOPHAGE) 500 MG tablet TAKE 1 TABLET TWICE DAILY  WITH MEALS 180 tablet 3  . omeprazole (PRILOSEC) 40 MG capsule TAKE 1 CAPSULE DAILY 90 capsule 3  . rosuvastatin (CRESTOR) 40 MG tablet Take 1 tablet (40 mg total) by mouth daily. 90 tablet 3  . valsartan-hydrochlorothiazide (DIOVAN-HCT) 80-12.5 MG tablet TAKE 1 TABLET DAILY 90 tablet 3  . Vitamin D, Ergocalciferol, (DRISDOL) 1.25 MG (50000 UT) CAPS capsule TAKE 1 CAPSULE ONCE EVERY 7 DAYS 8 capsule 0  . gabapentin (NEURONTIN) 300 MG capsule Take 1 capsule, po, up to TID prn or as directed 270 capsule 0   No current facility-administered medications for this visit.     Objective:  BP 124/74   Pulse 77   Temp 98 F (36.7 C) (Temporal)   Ht 5\' 1"  (1.549 m)   Wt 164 lb 3.2 oz (74.5 kg)   LMP  (LMP Unknown)   SpO2 98%   BMI 31.03 kg/m  Gen: NAD, resting comfortably CV: RRR no murmurs rubs or gallops Lungs: CTAB no crackles, wheeze, rhonchi Ext: no edema Skin: warm, dry  Assessment and Plan   # visiting [place] at end of April- grandson is making his communion. Will see a lot of fmaily.   # hypertension S:compliant with Valsartan-HCTZ 80-12.5mg  BP Readings from Last 3 Encounters:  12/30/19 124/74  12/25/19 (!) 142/92  09/02/19 138/78  A/P: Stable. Continue current medications.    # Diabetes S:Compliant with metformin 1000 mg in the am. Has not been taking in the PM- plan had been 500mg  at night.  Trying to eat a healthier diet- down 1 lb from last visit. Walking around neighborhood half hour 4 days a week Lab Results  Component Value Date   HGBA1C  7.1 (A) 09/02/2019   HGBA1C 7.0 (A) 04/28/2019   HGBA1C 6.5 (A) 07/23/2018  A/P: hopefully controlled- update a1c today. Can retry to add metformin 500mg  in evening if # still above 7 on a1c  # hyperlipidemia S:compliant with Crestor 40mg  Lab Results  Component Value Date   CHOL 210 (H) 04/28/2019   HDL 49.30 04/28/2019   LDLCALC 117 (H) 08/17/2016   LDLDIRECT 63.0 09/02/2019   TRIG 226.0 (H) 04/28/2019   CHOLHDL 4 04/28/2019   A/P: Excellent control on last check with LDL under 70-continue current medications  # Depression S:compliatn with Cymbalta 20mg .   Noted low energy after 2nd covid 19 vaccine- had some symptoms for 24 hours after vaccine but fatigue lingered. Has also had some sleeping issues as well- all of this seems to be getting better (tried 10 mg melatonin without help)  Depression screen [facility] 2/9 12/30/2019 12/30/2019 12/30/2019  Decreased Interest 3 3 3   Down, Depressed, Hopeless 3 3 3   PHQ - 2 Score 6 6 6   Altered sleeping 3 0 3  Tired, decreased energy 3 0 3  Change in appetite 0 0 0  Feeling bad or failure about yourself  0 0 0  Trouble concentrating 0 0 0  Moving slowly or fidgety/restless 0 0 0  Suicidal thoughts 0 0 -  PHQ-9 Score 12 6 12   Difficult doing work/chores Not difficult at all Not difficult at all Not difficult at all  A/P: phq9 is elevated today but she feels like most of this was related to 2nd covid vaccine and is improving- if fails to continue to improve she will let me know- for now continue cymbalta at current dose . expectation of travel to see grandson is helping her feel better.   # Stiff neck S:started about 2 weeks ago- did not seem to be related to covid vaccine (2 weeks later than that at least). Started on right side now on the left. Has not tried anything yet. Not affecting sleep and not worse at night  A/P: Does have some tension on the left side of her neck-likely muscular strain-no midline pain noted.  Recommended icing regimen for  3 days then switch to heat-also has follow-up with Dr. Tamala Julian next month and can check in with him if symptoms fail to improve-if symptoms worsen she can let us know sooner than that  Recommended follow up: Return in about 4 months (around 04/30/2020) for physical or sooner if needed. Future Appointments  Date Time Provider [place]  01/29/2020  9:15 AM Lyndal Pulley, DO [facility] None  06/22/2020 11:00 AM [facility] 3 [facility]    Lab/Order associations:   ICD-10-CM   1. Controlled type 2 diabetes mellitus without complication, without long-term current use of insulin (HCC)  E11.9 CBC with Differential/Platelet    Comprehensive metabolic panel  Hemoglobin A1c    LDL cholesterol, direct  2. Hypertension associated with diabetes ([place])  E11.59 CBC with Differential/Platelet   I10 Comprehensive metabolic panel  3. Hyperlipidemia associated with type 2 diabetes mellitus (HCC)  E11.69 CBC with Differential/Platelet   E78.5 Comprehensive metabolic panel    TSH  4. Stiff neck  M43.6    Meds ordered this encounter  Medications  . rosuvastatin (CRESTOR) 40 MG tablet    Sig: Take 1 tablet (40 mg total) by mouth daily.    Dispense:  90 tablet    Refill:  3   Return precautions advised.  [name], MD

## 2019-12-30 NOTE — Patient Instructions (Addendum)
If down mood continues- please let us know- right now you said its improving so we will monitor  Please stop by lab before you go If you do not have mychart- we will call you about results within 5 business days of [place] receiving them.  If you have mychart- we will send your results within 3 business days of [place] receiving them.  If abnormal or we want to clarify a result, we will call or mychart you to make sure you receive the message.  If you have questions or concerns or don't hear within 5 business days, please send [place] a message or call us.  I want you to ice your neck 3x a day for 20 minutes. After 3 days I want you to try heat 3x a day for 20 minutes.    Recommended follow up: Return in about 4 months (around 04/30/2020) for physical or sooner if needed.

## 2019-12-31 ENCOUNTER — Other Ambulatory Visit: Payer: Self-pay

## 2019-12-31 MED ORDER — METFORMIN HCL 1000 MG PO TABS: 1000.0000 mg | ORAL_TABLET | Freq: Two times a day (BID) | ORAL | 3 refills | Status: DC

## 2020-01-21 ENCOUNTER — Encounter: Payer: Self-pay | Admitting: Family Medicine

## 2020-01-21 MED ORDER — METFORMIN HCL ER (OSM) 1000 MG PO TB24: 1000.0000 mg | ORAL_TABLET | Freq: Two times a day (BID) | ORAL | 5 refills | Status: DC

## 2020-01-21 NOTE — Telephone Encounter (Signed)
Please advise 

## 2020-01-23 DIAGNOSIS — Z0100 Encounter for examination of eyes and vision without abnormal findings: Secondary | ICD-10-CM | POA: Diagnosis not present

## 2020-01-23 MED ORDER — METFORMIN HCL ER (MOD) 500 MG PO TB24: 1000.0000 mg | ORAL_TABLET | Freq: Two times a day (BID) | ORAL | 5 refills | Status: DC

## 2020-01-25 ENCOUNTER — Other Ambulatory Visit: Payer: Self-pay | Admitting: Family Medicine

## 2020-01-29 ENCOUNTER — Encounter: Payer: Self-pay | Admitting: Family Medicine

## 2020-01-29 ENCOUNTER — Other Ambulatory Visit: Payer: Self-pay

## 2020-01-29 ENCOUNTER — Ambulatory Visit: Payer: Medicare HMO | Admitting: Family Medicine

## 2020-01-29 ENCOUNTER — Ambulatory Visit (INDEPENDENT_AMBULATORY_CARE_PROVIDER_SITE_OTHER): Payer: Medicare HMO

## 2020-01-29 VITALS — BP 114/80 | HR 92 | Ht 61.0 in | Wt 164.0 lb

## 2020-01-29 DIAGNOSIS — M76892 Other specified enthesopathies of left lower limb, excluding foot: Secondary | ICD-10-CM

## 2020-01-29 DIAGNOSIS — M5442 Lumbago with sciatica, left side: Secondary | ICD-10-CM | POA: Diagnosis not present

## 2020-01-29 DIAGNOSIS — M5030 Other cervical disc degeneration, unspecified cervical region: Secondary | ICD-10-CM

## 2020-01-29 DIAGNOSIS — M542 Cervicalgia: Secondary | ICD-10-CM

## 2020-01-29 MED ORDER — PREDNISONE 50 MG PO TABS: ORAL_TABLET | ORAL | 0 refills | Status: DC

## 2020-01-29 NOTE — Assessment & Plan Note (Signed)
Degenerative disc disease.  Likely contributing.  X-rays are pending at the moment.  Patient though does have limited range of motion.  Could consider increasing gabapentin with her only at 200 at night and will hold at the moment.  Also can consider the Cymbalta.  Work with Product/process development scientist to learn home exercises in greater detail.  Follow-up again in 4 to 8 weeks

## 2020-01-29 NOTE — Assessment & Plan Note (Signed)
Significant improvement.  Should be fully resolved in the next weeks.

## 2020-01-29 NOTE — Progress Notes (Signed)
[facility] [place] Phone: [phone] Subjective:   Candice Guerrero, am serving as a scribe for Dr. Hulan Guerrero. This visit occurred during the SARS-CoV-2 public health emergency.  Safety protocols were in place, including screening questions prior to the visit, additional usage of staff PPE, and extensive cleaning of exam room while observing appropriate contact time as indicated for disinfecting solutions.   I'm seeing this patient by the request  of:  Candice Olp, MD  CC: Leg pain follow-up, neck pain new problem  [id number]   12/25/2019 Hamstring tendinitis with some mild inflammation noted.  Patient does have some underlying arthritis of the knee and we will get x-ray to further evaluate but it does appear to be more of the hamstring itself.  Discussed thigh compression sleeve, restart gabapentin in case there is some lumbar radiculopathy that is contributing as well and will help with some of the sleep time.  Discussed topical anti-inflammatories.  Follow-up again in 5 to 6 weeks.  Worsening symptoms consider formal physical therapy.  Update 01/29/2020 Candice Guerrero is a 69 y.o. female coming in with complaint of left thigh pain. Patient states that in the mornings her leg is stiff and painful. Pain improves throughout the day with movement. Does feel like she is progressing.   Also having stiffness in her neck for one month with side bending and rotation.  Patient denies any radiation of pain.  Can catch her though she tries to move her neck too fast.  Denies any radiation down the arm, any weakness.  Does not wake her up at night.  Rates the severity of pain is 5 out of 10      Past Medical History:  Diagnosis Date  . Abnormal mammogram of right breast 07/18/2017  . Allergy    SEASONAL  . Benign hematuria   . Cataract    LEFT EYE  . Dental crowns present   . Depression   . Diabetes mellitus    NIDDM  .  GERD (gastroesophageal reflux disease)   . Headache(784.0)    migraines, sinus  . Hiatal hernia   . Hyperlipidemia   . Hypertension    under control, has been on med. since 2005  . Osteopenia   . Psoriasis    arms, legs, back  . Snores    denies sx. of sleep apnea   Past Surgical History:  Procedure Laterality Date  . BREAST LUMPECTOMY WITH RADIOACTIVE SEED LOCALIZATION Right 07/18/2017   Procedure: RIGHT BREAST LUMPECTOMY WITH RADIOACTIVE SEED LOCALIZATION;  Surgeon: Candice Skates, MD;  Location: [facility];  Service: General;  Laterality: Right;  . COLONOSCOPY    . DILATION AND CURETTAGE OF UTERUS    . HARDWARE REMOVAL  07/11/2012   Procedure: HARDWARE REMOVAL;  Surgeon: Candice Guerrero., MD;  Location: [place];  Service: Orthopedics;  Laterality: Left;  pin removal wire out and debride pisiform  . left wrist surgery     July 9th 2013  . TONSILLECTOMY    . TOOTH EXTRACTION Right 12/13/2016   Procedure: SURGICAL REMOVAL OF ODONTOGENIC TUMOR AND REMOVAL OF IMPACTED TOOTH #1;  Surgeon: Candice Guerrero, DDS;  Location: [place];  Service: Oral Surgery;  Laterality: Right;  . TRIGGER FINGER RELEASE  07/11/2012   Procedure: RELEASE TRIGGER FINGER/A-1 Guerrero;  Surgeon: Candice Guerrero., MD;  Location: [place];  Service: Orthopedics;  Laterality: Left;  LEFT LONG FINGER   Social History   Socioeconomic History  . Marital status: Married    Spouse name: Not on file  . Number of children: Not on file  . Years of education: Not on file  . Highest education level: Not on file  Occupational History  . Not on file  Tobacco Use  . Smoking status: Former Smoker    Packs/day: 0.10    Years: 20.00    Pack years: 2.00    Types: Cigarettes    Quit date: 10/16/1998    Years since quitting: 21.3  . Smokeless tobacco: Never Used  . Tobacco comment: quit smoking in 2000  Substance and Sexual Activity  . Alcohol use: Yes     Comment: socially, weekends  . Drug use: Guerrero  . Sexual activity: Yes  Other Topics Concern  . Not on file  Social History Narrative   Moved to [facility] in 2011 from [place] due to more affordable.       Married 1972 Candice Guerrero). 2 kids. 5 grandkids. 2 in [place], 3 [place].       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       Hobbies: babysitting, casino   Social Determinants of Health   Financial Resource Strain:   . Difficulty of Paying Living Expenses:   Food Insecurity:   . Worried About Charity fundraiser in the Last Year:   . Arboriculturist in the Last Year:   Transportation Needs:   . Film/video editor (Medical):   Marland Kitchen Lack of Transportation (Non-Medical):   Physical Activity:   . Days of Exercise per Week:   . Minutes of Exercise per Session:   Stress:   . Feeling of Stress :   Social Connections:   . Frequency of Communication with Friends and Family:   . Frequency of Social Gatherings with Friends and Family:   . Attends Religious Services:   . Active Member of Clubs or Organizations:   . Attends Archivist Meetings:   Marland Kitchen Marital Status:    Allergies  Allergen Reactions  . Oxycodone Nausea Only  . Altace [Ramipril] Cough   Family History  Problem Relation Age of Onset  . Hyperlipidemia Mother   . Hypertension Mother   . Hypertension Father   . Kidney disease Father   . Anesthesia problems Sister        post-op N/V, hard to wake up post-op  . Diabetes Maternal Uncle     Current Outpatient Medications (Endocrine & Metabolic):  .  metFORMIN (GLUMETZA) 500 MG (MOD) 24 hr tablet, Take 2 tablets (1,000 mg total) by mouth 2 (two) times daily with a meal. .  predniSONE (DELTASONE) 50 MG tablet, 1 tablet by mouth daily  Current Outpatient Medications (Cardiovascular):  .  rosuvastatin (CRESTOR) 40 MG tablet, Take 1 tablet (40 mg total) by mouth daily. .  valsartan-hydrochlorothiazide (DIOVAN-HCT) 80-12.5 MG tablet,  TAKE 1 TABLET DAILY   Current Outpatient Medications (Analgesics):  .  aspirin 81 MG tablet, Take 81 mg by mouth daily.   Marland Kitchen  aspirin-acetaminophen-caffeine (EXCEDRIN MIGRAINE) 250-250-65 MG per tablet, Take 1 tablet by mouth every 6 (six) hours as needed. Reported on 10/21/2015   Current Outpatient Medications (Other):  .  calcipotriene-betamethasone (TACLONEX) ointment, Apply topically daily. .  Calcium Carbonate (CALTRATE 600) 1500 MG TABS, Take by mouth daily.   .  DULoxetine (CYMBALTA) 20 MG capsule, TAKE 1 CAPSULE BY MOUTH  EVERY DAY .  gabapentin (NEURONTIN) 100 MG capsule, Take 2 capsules (200 mg total) by mouth at bedtime. .  gabapentin (NEURONTIN) 300 MG capsule, Take 1 capsule, po, up to TID prn or as directed .  omeprazole (PRILOSEC) 40 MG capsule, TAKE 1 CAPSULE DAILY .  Vitamin D, Ergocalciferol, (DRISDOL) 1.25 MG (50000 UNIT) CAPS capsule, TAKE 1 CAPSULE ONCE EVERY 7 DAYS   Reviewed prior external information including notes and imaging from  primary care provider As well as notes that were available from care everywhere and other healthcare systems.  Past medical history, social, surgical and family history all reviewed in electronic medical record.  Guerrero pertanent information unless stated regarding to the chief complaint.   Review of Systems:  Guerrero headache, visual changes, nausea, vomiting, diarrhea, constipation, dizziness, abdominal pain, skin rash, fevers, chills, night sweats, weight loss, swollen lymph nodes, body aches, joint swelling, chest pain, shortness of breath, mood changes. POSITIVE muscle aches  Objective  Blood pressure 114/80, pulse 92, height 5\' 1"  (1.549 m), weight 164 lb (74.4 kg), SpO2 98 %.   General: Guerrero apparent distress alert and oriented x3 mood and affect normal, dressed appropriately.  HEENT: Pupils equal, extraocular movements intact  Respiratory: Patient's speak in full sentences and does not appear short of breath  Cardiovascular: Guerrero lower  extremity edema, non tender, Guerrero erythema  Neuro: Cranial nerves II through XII are intact, neurovascularly intact in all extremities with 2+ DTRs and 2+ pulses.  Gait normal with good balance and coordination.  MSK: Neck exam shows the patient does have loss of lordosis.  Tender to palpation in the paraspinal musculature more on the parascapular region of the back.  Patient lacks the last 10 degrees of extension of the neck with multiple crepitus.  Negative Spurling's though.  5-5 strength of the upper extremities.  Left knee shows the patient does have mild arthritic changes.  Improvement though noted where significant less tightness of the hamstring.  Minimal tenderness over that Pez anserine area.  Low back exam does show some more tightness noted on straight leg test on the left side.  Patient is tender to palpation over the sacroiliac joint.  Patient does have some arthritic changes.    Impression and Recommendations:     This case required medical decision making of moderate complexity. The above documentation has been reviewed and is accurate and complete Candice Pulley, DO       Note: This dictation was prepared with Dragon dictation along with smaller phrase technology. Any transcriptional errors that result from this process are unintentional.

## 2020-01-29 NOTE — Assessment & Plan Note (Signed)
Chronic problem with mild exacerbation.  Prednisone given today.  Discussed home exercises, continue the Cymbalta at a low dose.  May need to increase in the long run.  Discussed formal physical therapy which patient declined at the moment.  Give patient 6 to 8 weeks and see how patient responds.

## 2020-01-29 NOTE — Patient Instructions (Signed)
Alternate exercises Xray today Prednisone for 5 days  See me again in 6-8 weeks

## 2020-02-05 DIAGNOSIS — R69 Illness, unspecified: Secondary | ICD-10-CM | POA: Diagnosis not present

## 2020-02-11 ENCOUNTER — Encounter: Payer: Self-pay | Admitting: Family Medicine

## 2020-02-24 ENCOUNTER — Other Ambulatory Visit: Payer: Self-pay

## 2020-02-24 MED ORDER — METFORMIN HCL ER 500 MG PO TB24: 500.0000 mg | ORAL_TABLET | Freq: Two times a day (BID) | ORAL | 3 refills | Status: DC

## 2020-03-04 ENCOUNTER — Encounter: Payer: Self-pay | Admitting: Family Medicine

## 2020-03-05 ENCOUNTER — Other Ambulatory Visit: Payer: Self-pay

## 2020-03-05 DIAGNOSIS — L821 Other seborrheic keratosis: Secondary | ICD-10-CM | POA: Diagnosis not present

## 2020-03-05 DIAGNOSIS — D2272 Melanocytic nevi of left lower limb, including hip: Secondary | ICD-10-CM | POA: Diagnosis not present

## 2020-03-05 DIAGNOSIS — D485 Neoplasm of uncertain behavior of skin: Secondary | ICD-10-CM | POA: Diagnosis not present

## 2020-03-05 DIAGNOSIS — D2271 Melanocytic nevi of right lower limb, including hip: Secondary | ICD-10-CM | POA: Diagnosis not present

## 2020-03-05 DIAGNOSIS — L400 Psoriasis vulgaris: Secondary | ICD-10-CM | POA: Diagnosis not present

## 2020-03-05 DIAGNOSIS — D225 Melanocytic nevi of trunk: Secondary | ICD-10-CM | POA: Diagnosis not present

## 2020-03-05 DIAGNOSIS — L814 Other melanin hyperpigmentation: Secondary | ICD-10-CM | POA: Diagnosis not present

## 2020-03-05 DIAGNOSIS — Z86006 Personal history of melanoma in-situ: Secondary | ICD-10-CM | POA: Diagnosis not present

## 2020-03-05 DIAGNOSIS — L578 Other skin changes due to chronic exposure to nonionizing radiation: Secondary | ICD-10-CM | POA: Diagnosis not present

## 2020-03-05 MED ORDER — METFORMIN HCL 1000 MG PO TABS: 1000.0000 mg | ORAL_TABLET | Freq: Two times a day (BID) | ORAL | 3 refills | Status: DC

## 2020-03-16 ENCOUNTER — Ambulatory Visit: Payer: Medicare HMO | Admitting: Family Medicine

## 2020-03-16 DIAGNOSIS — R69 Illness, unspecified: Secondary | ICD-10-CM | POA: Diagnosis not present

## 2020-03-19 DIAGNOSIS — M79672 Pain in left foot: Secondary | ICD-10-CM | POA: Diagnosis not present

## 2020-03-19 DIAGNOSIS — M722 Plantar fascial fibromatosis: Secondary | ICD-10-CM | POA: Diagnosis not present

## 2020-03-19 DIAGNOSIS — R252 Cramp and spasm: Secondary | ICD-10-CM | POA: Diagnosis not present

## 2020-04-03 ENCOUNTER — Other Ambulatory Visit: Payer: Self-pay | Admitting: Family Medicine

## 2020-04-05 DIAGNOSIS — E119 Type 2 diabetes mellitus without complications: Secondary | ICD-10-CM | POA: Diagnosis not present

## 2020-04-05 DIAGNOSIS — H5213 Myopia, bilateral: Secondary | ICD-10-CM | POA: Diagnosis not present

## 2020-04-05 DIAGNOSIS — H524 Presbyopia: Secondary | ICD-10-CM | POA: Diagnosis not present

## 2020-04-05 LAB — HM DIABETES EYE EXAM

## 2020-04-07 ENCOUNTER — Encounter: Payer: Self-pay | Admitting: Family Medicine

## 2020-04-12 DIAGNOSIS — D485 Neoplasm of uncertain behavior of skin: Secondary | ICD-10-CM | POA: Diagnosis not present

## 2020-04-27 DIAGNOSIS — N951 Menopausal and female climacteric states: Secondary | ICD-10-CM | POA: Diagnosis not present

## 2020-04-30 ENCOUNTER — Ambulatory Visit (INDEPENDENT_AMBULATORY_CARE_PROVIDER_SITE_OTHER): Payer: Medicare HMO

## 2020-04-30 ENCOUNTER — Encounter: Payer: Self-pay | Admitting: Neurology

## 2020-04-30 ENCOUNTER — Encounter: Payer: Self-pay | Admitting: Family Medicine

## 2020-04-30 ENCOUNTER — Other Ambulatory Visit: Payer: Self-pay

## 2020-04-30 ENCOUNTER — Ambulatory Visit (INDEPENDENT_AMBULATORY_CARE_PROVIDER_SITE_OTHER): Payer: Medicare HMO | Admitting: Family Medicine

## 2020-04-30 VITALS — BP 138/78 | HR 100 | Temp 98.6°F | Ht 61.0 in | Wt 160.0 lb

## 2020-04-30 DIAGNOSIS — E785 Hyperlipidemia, unspecified: Secondary | ICD-10-CM | POA: Diagnosis not present

## 2020-04-30 DIAGNOSIS — R251 Tremor, unspecified: Secondary | ICD-10-CM

## 2020-04-30 DIAGNOSIS — R69 Illness, unspecified: Secondary | ICD-10-CM | POA: Diagnosis not present

## 2020-04-30 DIAGNOSIS — F418 Other specified anxiety disorders: Secondary | ICD-10-CM

## 2020-04-30 DIAGNOSIS — Z0000 Encounter for general adult medical examination without abnormal findings: Secondary | ICD-10-CM

## 2020-04-30 DIAGNOSIS — I10 Essential (primary) hypertension: Secondary | ICD-10-CM | POA: Diagnosis not present

## 2020-04-30 DIAGNOSIS — E119 Type 2 diabetes mellitus without complications: Secondary | ICD-10-CM

## 2020-04-30 DIAGNOSIS — E1169 Type 2 diabetes mellitus with other specified complication: Secondary | ICD-10-CM | POA: Diagnosis not present

## 2020-04-30 DIAGNOSIS — E1159 Type 2 diabetes mellitus with other circulatory complications: Secondary | ICD-10-CM | POA: Diagnosis not present

## 2020-04-30 DIAGNOSIS — I152 Hypertension secondary to endocrine disorders: Secondary | ICD-10-CM

## 2020-04-30 MED ORDER — CITALOPRAM HYDROBROMIDE 20 MG PO TABS
20.0000 mg | ORAL_TABLET | Freq: Every day | ORAL | 3 refills | Status: DC
Start: 2020-04-30 — End: 2021-04-25

## 2020-04-30 MED ORDER — METFORMIN HCL ER (OSM) 500 MG PO TB24: 1000.0000 mg | ORAL_TABLET | Freq: Two times a day (BID) | ORAL | 3 refills | Status: DC

## 2020-04-30 NOTE — Progress Notes (Signed)
Virtual Visit via Telephone Note  I connected with  Candice Guerrero on 04/30/20 at 10:15 AM EDT by telephone and verified that I am speaking with the correct person using two identifiers.  Medicare Annual Wellness visit completed telephonically due to Covid-19 pandemic.   Persons participating in this call: This Health Coach and this patient.   Location: Patient: Home Provider: Office   I discussed the limitations, risks, security and privacy concerns of performing an evaluation and management service by telephone and the availability of in person appointments. The patient expressed understanding and agreed to proceed.  Unable to perform video visit due to video visit attempted and failed and/or patient does not have video capability.   Some vital signs may be absent or patient reported.   Willette Brace, LPN    Subjective:   Candice Guerrero is a 69 y.o. female who presents for Medicare Annual (Subsequent) preventive examination.  Review of Systems     Cardiac Risk Factors include: advanced age (>26men, >41 women);diabetes mellitus;dyslipidemia;hypertension;obesity (BMI >30kg/m2)     Objective:    There were no vitals filed for this visit. There is no height or weight on file to calculate BMI.  Advanced Directives 04/30/2020 07/18/2017 07/12/2017 12/13/2016 12/08/2016 07/04/2012 04/19/2012  Does Patient Have a Medical Advance Directive? No No No No No Patient does not have advance directive;Patient would like information Patient does not have advance directive;Patient would like information  Does patient want to make changes to medical advance directive? Yes (MAU/Ambulatory/Procedural Areas - Information given) - - - - - -  Would patient like information on creating a medical advance directive? - - - No - Patient declined - - Advance directive packet given    Current Medications (verified) Outpatient Encounter Medications as of 04/30/2020  Medication Sig  . aspirin 81 MG tablet Take 81  mg by mouth daily.    Marland Kitchen aspirin-acetaminophen-caffeine (EXCEDRIN MIGRAINE) 250-250-65 MG per tablet Take 1 tablet by mouth every 6 (six) hours as needed. Reported on 10/21/2015  . calcipotriene-betamethasone (TACLONEX) ointment Apply topically daily.  . Calcium Carbonate (CALTRATE 600) 1500 MG TABS Take by mouth daily.    . DULoxetine (CYMBALTA) 20 MG capsule TAKE 1 CAPSULE BY MOUTH EVERY DAY  . metFORMIN (GLUCOPHAGE) 1000 MG tablet Take 1 tablet (1,000 mg total) by mouth 2 (two) times daily with a meal.  . omeprazole (PRILOSEC) 40 MG capsule TAKE 1 CAPSULE DAILY  . rosuvastatin (CRESTOR) 40 MG tablet Take 1 tablet (40 mg total) by mouth daily.  Marland Kitchen triamcinolone cream (KENALOG) 0.1 % APPLY TO AFFECTED AREA TWICE A DAY  . valsartan-hydrochlorothiazide (DIOVAN-HCT) 80-12.5 MG tablet TAKE 1 TABLET DAILY  . Vitamin D, Ergocalciferol, (DRISDOL) 1.25 MG (50000 UNIT) CAPS capsule TAKE 1 CAPSULE ONCE EVERY 7 DAYS  . [DISCONTINUED] gabapentin (NEURONTIN) 100 MG capsule Take 2 capsules (200 mg total) by mouth at bedtime.  . [DISCONTINUED] gabapentin (NEURONTIN) 300 MG capsule Take 1 capsule, po, up to TID prn or as directed  . [DISCONTINUED] predniSONE (DELTASONE) 50 MG tablet 1 tablet by mouth daily   No facility-administered encounter medications on file as of 04/30/2020.    Allergies (verified) Oxycodone and Altace [ramipril]   History: Past Medical History:  Diagnosis Date  . Abnormal mammogram of right breast 07/18/2017  . Allergy    SEASONAL  . Benign hematuria   . Cataract    LEFT EYE  . Dental crowns present   . Depression   . Diabetes mellitus    NIDDM  .  GERD (gastroesophageal reflux disease)   . Headache(784.0)    migraines, sinus  . Hiatal hernia   . Hyperlipidemia   . Hypertension    under control, has been on med. since 2005  . Osteopenia   . Psoriasis    arms, legs, back  . Snores    denies sx. of sleep apnea   Past Surgical History:  Procedure Laterality Date  .  BREAST LUMPECTOMY WITH RADIOACTIVE SEED LOCALIZATION Right 07/18/2017   Procedure: RIGHT BREAST LUMPECTOMY WITH RADIOACTIVE SEED LOCALIZATION;  Surgeon: Fanny Skates, MD;  Location: [facility];  Service: General;  Laterality: Right;  . COLONOSCOPY    . DILATION AND CURETTAGE OF UTERUS    . HARDWARE REMOVAL  07/11/2012   Procedure: HARDWARE REMOVAL;  Surgeon: Cammie Sickle., MD;  Location: [facility];  Service: Orthopedics;  Laterality: Left;  pin removal wire out and debride pisiform  . left wrist surgery     July 9th 2013  . TONSILLECTOMY    . TOOTH EXTRACTION Right 12/13/2016   Procedure: SURGICAL REMOVAL OF ODONTOGENIC TUMOR AND REMOVAL OF IMPACTED TOOTH #1;  Surgeon: Jannette Fogo, DDS;  Location: [facility];  Service: Oral Surgery;  Laterality: Right;  . TRIGGER FINGER RELEASE  07/11/2012   Procedure: RELEASE TRIGGER FINGER/A-1 PULLEY;  Surgeon: Cammie Sickle., MD;  Location: [facility];  Service: Orthopedics;  Laterality: Left;  LEFT LONG FINGER   Family History  Problem Relation Age of Onset  . Hyperlipidemia Mother   . Hypertension Mother   . Hypertension Father   . Kidney disease Father   . Anesthesia problems Sister        post-op N/V, hard to wake up post-op  . Diabetes Maternal Uncle    Social History   Socioeconomic History  . Marital status: Married    Spouse name: Not on file  . Number of children: Not on file  . Years of education: Not on file  . Highest education level: Not on file  Occupational History  . Occupation: Retired  Tobacco Use  . Smoking status: Former Smoker    Packs/day: 0.10    Years: 20.00    Pack years: 2.00    Types: Cigarettes    Quit date: 10/16/1998    Years since quitting: 21.5  . Smokeless tobacco: Never Used  . Tobacco comment: quit smoking in 2000  Vaping Use  . Vaping Use: Never used  Substance and Sexual Activity  . Alcohol use: Yes    Comment: socially,  weekends  . Drug use: No  . Sexual activity: Yes  Other Topics Concern  . Not on file  Social History Narrative   Moved to [facility] [place] in 2011 from [place] due to more affordable.       Married 1972 Mortimer Fries). 2 kids. 5 grandkids. 2 in [place], 3 [place].       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       Hobbies: babysitting, casino   Social Determinants of Health   Financial Resource Strain: Low Risk   . Difficulty of Paying Living Expenses: Not hard at all  Food Insecurity: No Food Insecurity  . Worried About Charity fundraiser in the Last Year: Never true  . Ran Out of Food in the Last Year: Never true  Transportation Needs: No Transportation Needs  . Lack of Transportation (Medical): No  . Lack of Transportation (Non-Medical):  No  Physical Activity: Inactive  . Days of Exercise per Week: 0 days  . Minutes of Exercise per Session: 0 min  Stress: No Stress Concern Present  . Feeling of Stress : Not at all  Social Connections: Moderately Integrated  . Frequency of Communication with Friends and Family: More than three times a week  . Frequency of Social Gatherings with Friends and Family: More than three times a week  . Attends Religious Services: 1 to 4 times per year  . Active Member of Clubs or Organizations: No  . Attends Archivist Meetings: Never  . Marital Status: Married    Tobacco Counseling Counseling given: Not Answered Comment: quit smoking in 2000   Clinical Intake:  Pre-visit preparation completed: Yes  Pain : No/denies pain     BMI - recorded: 30.76 Nutritional Status: BMI > 30  Obese Diabetes: Yes CBG done?: No Did pt. bring in CBG monitor from home?: No  How often do you need to have someone help you when you read instructions, pamphlets, or other written materials from your doctor or pharmacy?: 1 - Never  Diabetic?yes  Interpreter Needed?: No  Information entered by :: Charlott Rakes,  LPN   Activities of Daily Living In your present state of health, do you have any difficulty performing the following activities: 04/30/2020 12/30/2019  Hearing? N N  Vision? N N  Difficulty concentrating or making decisions? N N  Walking or climbing stairs? N N  Dressing or bathing? N N  Doing errands, shopping? N N  Preparing Food and eating ? N -  Using the Toilet? N -  In the past six months, have you accidently leaked urine? N -  Do you have problems with loss of bowel control? N -  Managing your Medications? N -  Managing your Finances? N -  Housekeeping or managing your Housekeeping? N -  Some recent data might be hidden    Patient Care Team: [name], MD as PCP - General (Family Medicine) Christophe Louis, MD as PCP - OBGYN (Obstetrics and Gynecology) Opthamology, [facility] as Consulting Physician (Ophthalmology)  Indicate any recent Medical Services you may have received from other than [facility] providers in the past year (date may be approximate).     Assessment:   This is a routine wellness examination for Candice Guerrero.  Hearing/Vision screen  Hearing Screening   125Hz  250Hz  500Hz  1000Hz  2000Hz  3000Hz  4000Hz  6000Hz  8000Hz   Right ear:           Left ear:           Comments: Pt denies any difficulty hearing    Dietary issues and exercise activities discussed: Current Exercise Habits: Home exercise routine, Exercise limited by: orthopedic condition(s)  Goals    . Patient Stated     To lose 25lbs this year      Depression Screen PHQ 2/9 Scores 04/30/2020 12/30/2019 12/30/2019 12/30/2019 09/02/2019 04/28/2019 07/23/2018  PHQ - 2 Score 0 6 6 6  0 0 0  PHQ- 9 Score - 12 6 12 2 2  0    Fall Risk Fall Risk  04/30/2020 09/02/2019 04/28/2019 07/23/2018 03/14/2017  Falls in the past year? 0 0 1 No No  Number falls in past yr: 0 0 0 - -  Injury with Fall? 0 0 0 - -  Risk for fall due to : Impaired vision;Orthopedic patient - Impaired balance/gait - -  Follow up - - Education  provided - -    Any stairs  in or around the home? Yes  If so, are there any without handrails? No  Home free of loose throw rugs in walkways, pet beds, electrical cords, etc? Yes  Adequate lighting in your home to reduce risk of falls? Yes   ASSISTIVE DEVICES UTILIZED TO PREVENT FALLS:  Life alert? No  Use of a cane, walker or w/c? No  Grab bars in the bathroom? Yes  Shower chair or bench in shower? No  Elevated toilet seat or a handicapped toilet? No   TIMED UP AND GO:  Was the test performed? No   Cognitive Function:     6CIT Screen 04/30/2020  What Year? 0 points  What month? 0 points  What time? 0 points  Count back from 20 0 points  Months in reverse 0 points  Repeat phrase 0 points  Total Score 0    Immunizations Immunization History  Administered Date(s) Administered  . Influenza Split 08/09/2011, 08/13/2012  . Influenza, High Dose Seasonal PF 08/17/2016, 07/30/2017, 07/23/2018, 07/23/2019  . Influenza,inj,Quad PF,6+ Mos 06/30/2013, 07/24/2014, 08/09/2015  . Influenza-Unspecified 07/23/2019  . PFIZER SARS-COV-2 Vaccination 11/07/2019, 11/28/2019  . Pneumococcal Conjugate-13 12/31/2013  . Pneumococcal Polysaccharide-23 01/26/2015  . Tdap 10/16/2002, 02/24/2013  . Zoster 12/13/2010  . Zoster Recombinat (Shingrix) 03/14/2017, 05/21/2017    TDAP status: Up to date Flu Vaccine status: Up to date Pneumococcal vaccine status: Up to date Covid-19 vaccine status: Completed vaccines  Qualifies for Shingles Vaccine? Yes   Zostavax completed Yes   Shingrix Completed?: Yes  Screening Tests Health Maintenance  Topic Date Due  . INFLUENZA VACCINE  05/16/2020  . HEMOGLOBIN A1C  07/01/2020  . FOOT EXAM  09/01/2020  . OPHTHALMOLOGY EXAM  04/05/2021  . MAMMOGRAM  06/16/2021  . TETANUS/TDAP  02/25/2023  . COLONOSCOPY  04/30/2024  . DEXA SCAN  Completed  . COVID-19 Vaccine  Completed  . Hepatitis C Screening  Completed  . PNA vac Low Risk Adult  Completed     Health Maintenance  There are no preventive care reminders to display for this patient.  Colorectal cancer screening: Completed 04/30/14. Repeat every 10 years Mammogram status: Completed 06/17/19. Repeat every year Bone Density status: Completed 12/18/17. Results reflect: Bone density results: OSTEOPOROSIS. Repeat every 2 years.    Additional Screening:  Hepatitis C Screening: Completed 08/09/15  Vision Screening: Recommended annual ophthalmology exams for early detection of glaucoma and other disorders of the eye. Is the patient up to date with their annual eye exam?  Yes  Who is the provider or what is the name of the office in which the patient attends annual eye exams? Dr Melissa Noon, [facility] Op[facility]  Dental Screening: Recommended annual dental exams for proper oral hygiene  Community Resource Referral / Chronic Care Management: CRR required this visit?  No   CCM required this visit?  No      Plan:     I have personally reviewed and noted the following in the patient's chart:   . Medical and social history . Use of alcohol, tobacco or illicit drugs  . Current medications and supplements . Functional ability and status . Nutritional status . Physical activity . Advanced directives . List of other physicians . Hospitalizations, surgeries, and ER visits in previous 12 months . Vitals . Screenings to include cognitive, depression, and falls . Referrals and appointments  In addition, I have reviewed and discussed with patient certain preventive protocols, quality metrics, and best practice recommendations. A written personalized care plan for preventive services  as well as general preventive health recommendations were provided to patient.     Willette Brace, LPN   [id number]   Nurse Notes: None

## 2020-04-30 NOTE — Addendum Note (Signed)
Addended by: Betti Cruz on: 04/30/2020 03:39 PM   Modules accepted: Orders

## 2020-04-30 NOTE — Patient Instructions (Addendum)
Candice Guerrero , Thank you for taking time to come for your Medicare Wellness Visit. I appreciate your ongoing commitment to your health goals. Please review the following plan we discussed and let me know if I can assist you in the future.   Screening recommendations/referrals: Colonoscopy: Done 04/30/14 Mammogram: Done 06/17/19 Bone Density: Done 12/18/17 Recommended yearly ophthalmology/optometry visit for glaucoma screening and checkup Recommended yearly dental visit for hygiene and checkup  Vaccinations: Influenza vaccine: Up to date Pneumococcal vaccine: Up to date Tdap vaccine: Up to date Shingles vaccine: Completed 5/30 & 05/21/17   Covid-19:Completed 1/22 & 11/28/19  Advanced directives: Advance directive discussed with you today. I have provided a copy for you to complete at home and have notarized. Once this is complete please bring a copy in to our office so we can scan it into your chart.   Conditions/risks identified: To lose weight   Next appointment: Follow up in one year for your annual wellness visit    Preventive Care 65 Years and Older, Female Preventive care refers to lifestyle choices and visits with your health care provider that can promote health and wellness. What does preventive care include?  A yearly physical exam. This is also called an annual well check.  Dental exams once or twice a year.  Routine eye exams. Ask your health care provider how often you should have your eyes checked.  Personal lifestyle choices, including:  Daily care of your teeth and gums.  Regular physical activity.  Eating a healthy diet.  Avoiding tobacco and drug use.  Limiting alcohol use.  Practicing safe sex.  Taking low-dose aspirin every day.  Taking vitamin and mineral supplements as recommended by your health care provider. What happens during an annual well check? The services and screenings done by your health care provider during your annual well check will depend  on your age, overall health, lifestyle risk factors, and family history of disease. Counseling  Your health care provider may ask you questions about your:  Alcohol use.  Tobacco use.  Drug use.  Emotional well-being.  Home and relationship well-being.  Sexual activity.  Eating habits.  History of falls.  Memory and ability to understand (cognition).  Work and work Statistician.  Reproductive health. Screening  You may have the following tests or measurements:  Height, weight, and BMI.  Blood pressure.  Lipid and cholesterol levels. These may be checked every 5 years, or more frequently if you are over 43 years old.  Skin check.  Lung cancer screening. You may have this screening every year starting at age 101 if you have a 30-pack-year history of smoking and currently smoke or have quit within the past 15 years.  Fecal occult blood test (FOBT) of the stool. You may have this test every year starting at age 32.  Flexible sigmoidoscopy or colonoscopy. You may have a sigmoidoscopy every 5 years or a colonoscopy every 10 years starting at age 32.  Hepatitis C blood test.  Hepatitis B blood test.  Sexually transmitted disease (STD) testing.  Diabetes screening. This is done by checking your blood sugar (glucose) after you have not eaten for a while (fasting). You may have this done every 1-3 years.  Bone density scan. This is done to screen for osteoporosis. You may have this done starting at age 3.  Mammogram. This may be done every 1-2 years. Talk to your health care provider about how often you should have regular mammograms. Talk with your health care provider  about your test results, treatment options, and if necessary, the need for more tests. Vaccines  Your health care provider may recommend certain vaccines, such as:  Influenza vaccine. This is recommended every year.  Tetanus, diphtheria, and acellular pertussis (Tdap, Td) vaccine. You may need a Td  booster every 10 years.  Zoster vaccine. You may need this after age 88.  Pneumococcal 13-valent conjugate (PCV13) vaccine. One dose is recommended after age 70.  Pneumococcal polysaccharide (PPSV23) vaccine. One dose is recommended after age 38. Talk to your health care provider about which screenings and vaccines you need and how often you need them. This information is not intended to replace advice given to you by your health care provider. Make sure you discuss any questions you have with your health care provider. Document Released: 10/29/2015 Document Revised: 06/21/2016 Document Reviewed: 08/03/2015 Elsevier Interactive Patient Education  2017 [employer] Prevention in the Home Falls can cause injuries. They can happen to people of all ages. There are many things you can do to make your home safe and to help prevent falls. What can I do on the outside of my home?  Regularly fix the edges of walkways and driveways and fix any cracks.  Remove anything that might make you trip as you walk through a door, such as a raised step or threshold.  Trim any bushes or trees on the path to your home.  Use bright outdoor lighting.  Clear any walking paths of anything that might make someone trip, such as rocks or tools.  Regularly check to see if handrails are loose or broken. Make sure that both sides of any steps have handrails.  Any raised decks and porches should have guardrails on the edges.  Have any leaves, snow, or ice cleared regularly.  Use sand or salt on walking paths during winter.  Clean up any spills in your garage right away. This includes oil or grease spills. What can I do in the bathroom?  Use night lights.  Install grab bars by the toilet and in the tub and shower. Do not use towel bars as grab bars.  Use non-skid mats or decals in the tub or shower.  If you need to sit down in the shower, use a plastic, non-slip stool.  Keep the floor dry. Clean up  any water that spills on the floor as soon as it happens.  Remove soap buildup in the tub or shower regularly.  Attach bath mats securely with double-sided non-slip rug tape.  Do not have throw rugs and other things on the floor that can make you trip. What can I do in the bedroom?  Use night lights.  Make sure that you have a light by your bed that is easy to reach.  Do not use any sheets or blankets that are too big for your bed. They should not hang down onto the floor.  Have a firm chair that has side arms. You can use this for support while you get dressed.  Do not have throw rugs and other things on the floor that can make you trip. What can I do in the kitchen?  Clean up any spills right away.  Avoid walking on wet floors.  Keep items that you use a lot in easy-to-reach places.  If you need to reach something above you, use a strong step stool that has a grab bar.  Keep electrical cords out of the way.  Do not use floor polish or  wax that makes floors slippery. If you must use wax, use non-skid floor wax.  Do not have throw rugs and other things on the floor that can make you trip. What can I do with my stairs?  Do not leave any items on the stairs.  Make sure that there are handrails on both sides of the stairs and use them. Fix handrails that are broken or loose. Make sure that handrails are as long as the stairways.  Check any carpeting to make sure that it is firmly attached to the stairs. Fix any carpet that is loose or worn.  Avoid having throw rugs at the top or bottom of the stairs. If you do have throw rugs, attach them to the floor with carpet tape.  Make sure that you have a light switch at the top of the stairs and the bottom of the stairs. If you do not have them, ask someone to add them for you. What else can I do to help prevent falls?  Wear shoes that:  Do not have high heels.  Have rubber bottoms.  Are comfortable and fit you well.  Are  closed at the toe. Do not wear sandals.  If you use a stepladder:  Make sure that it is fully opened. Do not climb a closed stepladder.  Make sure that both sides of the stepladder are locked into place.  Ask someone to hold it for you, if possible.  Clearly mark and make sure that you can see:  Any grab bars or handrails.  First and last steps.  Where the edge of each step is.  Use tools that help you move around (mobility aids) if they are needed. These include:  Canes.  Walkers.  Scooters.  Crutches.  Turn on the lights when you go into a dark area. Replace any light bulbs as soon as they burn out.  Set up your furniture so you have a clear path. Avoid moving your furniture around.  If any of your floors are uneven, fix them.  If there are any pets around you, be aware of where they are.  Review your medicines with your doctor. Some medicines can make you feel dizzy. This can increase your chance of falling. Ask your doctor what other things that you can do to help prevent falls. This information is not intended to replace advice given to you by your health care provider. Make sure you discuss any questions you have with your health care provider. Document Released: 07/29/2009 Document Revised: 03/09/2016 Document Reviewed: 11/06/2014 Elsevier Interactive Patient Education  2017 [employer].

## 2020-04-30 NOTE — Addendum Note (Signed)
Addended by: Betti Cruz on: 04/30/2020 03:40 PM   Modules accepted: Orders

## 2020-04-30 NOTE — Patient Instructions (Addendum)
#  For first 3 days take cymbalta daily along with citalopram half tablet. After 3rd day stop cymbalta and start full citalopram 20mg .   #head sweats- was told could be caffeine or alcohol by GYN. cymbalta may have stronger links to sweating than celexa so we will see if that helps switching back (issues have been going on abotu 1.5 years and thinks made switch to cymbalta around that time) May also need to try lower dose omeprazole which has also been linked with sweating- she will update me 1-2 months.   Please stop by lab before you go If you have mychart- we will send your results within 3 business days of [place] receiving them.  If you do not have mychart- we will call you about results within 5 business days of [place] receiving them.    Recommended follow up: Return in about 4 months (around 08/31/2020) for physical or sooner if needed.

## 2020-04-30 NOTE — Assessment & Plan Note (Signed)
#   Tremor  S: Issues with tremor for several years (probably 2).  Worse with handwriting or smaller activities.  Not rapidly progressive.  No resting tremor. Has only really noted in left hand. A/P: We discussed possible treatments today.  I strongly suspect essential tremor.  We will verify with referral to neurology-Dr. Tat

## 2020-04-30 NOTE — Progress Notes (Signed)
Phone [phone] In person visit   Subjective:   Candice Guerrero is a 69 y.o. year old very pleasant female patient who presents for/with See problem oriented charting Chief Complaint  Patient presents with  . Diabetes  . Hypertension  . Hyperlipidemia   This visit occurred during the SARS-CoV-2 public health emergency.  Safety protocols were in place, including screening questions prior to the visit, additional usage of staff PPE, and extensive cleaning of exam room while observing appropriate contact time as indicated for disinfecting solutions.   Past Medical History-  Patient Active Problem List   Diagnosis Date Noted  . Diabetes mellitus type II, controlled, with no complications ([place]) 15/40/0867    Priority: High  . Tremor 04/30/2020    Priority: Medium  . Osteoporosis 07/24/2014    Priority: Medium  . IBS (irritable bowel syndrome) 07/24/2014    Priority: Medium  . Psoriasis 12/13/2010    Priority: Medium  . Hypertension associated with diabetes ([place]) 12/13/2010    Priority: Medium  . Depression with anxiety 12/13/2010    Priority: Medium  . Fatty liver 12/13/2010    Priority: Medium  . Hyperlipidemia associated with type 2 diabetes mellitus ([place]) 12/13/2010    Priority: Medium  . Acute midline low back pain with left-sided sciatica 11/06/2018    Priority: Low  . Abnormal mammogram of right breast 07/18/2017    Priority: Low  . Allergic rhinitis 07/24/2014    Priority: Low  . Obesity (BMI 30-39.9) 12/31/2013    Priority: Low  . Benign hematuria 12/13/2010    Priority: Low  . GERD (gastroesophageal reflux disease) 12/13/2010    Priority: Low  . DDD (degenerative disc disease), cervical 01/29/2020  . Hamstring tendinitis of left thigh 12/25/2019    Medications- reviewed and updated Current Outpatient Medications  Medication Sig Dispense Refill  . aspirin 81 MG tablet Take 81 mg by mouth daily.      Marland Kitchen aspirin-acetaminophen-caffeine (EXCEDRIN MIGRAINE)  250-250-65 MG per tablet Take 1 tablet by mouth every 6 (six) hours as needed. Reported on 10/21/2015    . calcipotriene-betamethasone (TACLONEX) ointment Apply topically daily. 60 g 0  . Calcium Carbonate (CALTRATE 600) 1500 MG TABS Take by mouth daily.      . Cyanocobalamin (VITAMIN B 12 PO) Take by mouth.    . DULoxetine (CYMBALTA) 20 MG capsule TAKE 1 CAPSULE BY MOUTH EVERY DAY 90 capsule 2  . metFORMIN (GLUCOPHAGE) 1000 MG tablet Take 1 tablet (1,000 mg total) by mouth 2 (two) times daily with a meal. 180 tablet 3  . omeprazole (PRILOSEC) 40 MG capsule TAKE 1 CAPSULE DAILY 90 capsule 3  . rosuvastatin (CRESTOR) 40 MG tablet Take 1 tablet (40 mg total) by mouth daily. 90 tablet 3  . triamcinolone cream (KENALOG) 0.1 % APPLY TO AFFECTED AREA TWICE A DAY    . valsartan-hydrochlorothiazide (DIOVAN-HCT) 80-12.5 MG tablet TAKE 1 TABLET DAILY 90 tablet 3  . Vitamin D, Ergocalciferol, (DRISDOL) 1.25 MG (50000 UNIT) CAPS capsule TAKE 1 CAPSULE ONCE EVERY 7 DAYS 8 capsule 0  . citalopram (CELEXA) 20 MG tablet Take 1 tablet (20 mg total) by mouth daily. 90 tablet 3   No current facility-administered medications for this visit.     Objective:  BP 138/78   Pulse 100   Temp 98.6 F (37 C) (Temporal)   Ht 5\' 1"  (1.549 m)   Wt 160 lb (72.6 kg)   LMP  (LMP Unknown)   SpO2 94%   BMI 30.23 kg/m  Gen: NAD, resting comfortably CV: RRR no murmurs rubs or gallops Lungs: CTAB no crackles, wheeze, rhonchi Abdomen: soft/nontender/nondistended/normal bowel sounds.  Ext: no edema Skin: warm, dry  Neuro: slight tremor in left hand with intention. Occasional tremulousness of head. No tremor right hand.     Assessment and Plan    # Diabetes S: Medication:metformin  1000 mg BID ER CBGs-  110s for most part.  Exercise and diet-  down 4 lbs from last visit- but wonders if this was metformin low appetite with higher dose.  Lab Results  Component Value Date   HGBA1C 7.7 (H) 12/30/2019   HGBA1C 7.1 (A)  09/02/2019   HGBA1C 7.0 (A) 04/28/2019    A/P: hopefully controlled- update a1c today  #hypertension S: medication: :compliant withValsartan-HCTZ 80-12.5mg  BP Readings from Last 3 Encounters:  04/30/20 138/78  01/29/20 114/80  12/30/19 124/74  A/P: good control-continue current medications  # Tremor - left hand S: Issues with tremor for several years (probably 2).  Worse with handwriting or smaller activities.  Not rapidly progressive.  No resting tremor. Has only really noted in left hand. A/P: We discussed possible treatments today.  I strongly suspect essential tremor.  We will verify with referral to neurology-Dr. Tat -also may have slight tremor in head/neck- also suspect essential tremor  #hyperlipidemia S: Medication:compliant withCrestor 40mg  Lab Results  Component Value Date   CHOL 210 (H) 04/28/2019   HDL 49.30 04/28/2019   LDLCALC 117 (H) 08/17/2016   LDLDIRECT 58.0 12/30/2019   TRIG 226.0 (H) 04/28/2019   CHOLHDL 4 04/28/2019   A/P: Direct LDL was excellent on last check-continue current medications  # Depression S: Medication:compliatn with Cymbalta 20mg . She thinks celexa worked a litle better- slight more irritability Depression screen [facility] 2/9 04/30/2020 04/30/2020 12/30/2019  Decreased Interest 0 0 3  Down, Depressed, Hopeless 0 0 3  PHQ - 2 Score 0 0 6  Altered sleeping 1 - 3  Tired, decreased energy 0 - 3  Change in appetite 0 - 0  Feeling bad or failure about yourself  0 - 0  Trouble concentrating 0 - 0  Moving slowly or fidgety/restless 0 - 0  Suicidal thoughts 0 - 0  PHQ-9 Score 1 - 12  Difficult doing work/chores Not difficult at all - Not difficult at all  Some recent data might be hidden  A/P: Remains in remission- did better on citalopram- will change back as per avs  #pinched nerve in back- has not had issues since injection.   #head sweats- was told could be caffeine or alcohol by GYN. cymbalta may have stronger links to sweating than celexa  so we will see if that helps switching back (issues have been going on abotu 1.5 years and thinks made switch to cymbalta around that time) May also need to try lower dose omeprazole which has also been linked with sweating- she will update me 1-2 months.   #hamstring tendonitis/left knee and hamstring pain- she continues to have issues- had seen Dr. Tamala Julian. She will be seeing [facility]- 28th of this month. Has pain around lateral knee and into hamstring- worse if sedentary and then gets up.   #osteoporosis- sees gyn later this year and they will order repeat DEXA- she will send to me  Recommended follow up: Return in about 4 months (around 08/31/2020) for physical or sooner if needed. Future Appointments  Date Time Provider [place]  06/22/2020 11:00 AM [facility] 3 [facility]  05/06/2021 10:15  AM [facility]    Lab/Order associations:   ICD-10-CM   1. Controlled type 2 diabetes mellitus without complication, without long-term current use of insulin (HCC)  E11.9   2. Hypertension associated with diabetes ([place])  E11.59    I10   3. Hyperlipidemia associated with type 2 diabetes mellitus ([place])  E11.69    E78.5   4. Tremor  R25.1 Ambulatory referral to Neurology    Meds ordered this encounter  Medications  . citalopram (CELEXA) 20 MG tablet    Sig: Take 1 tablet (20 mg total) by mouth daily.    Dispense:  90 tablet    Refill:  3   Return precautions advised.  [name], MD

## 2020-05-01 LAB — LIPID PANEL
Cholesterol: 118 mg/dL (ref <200)
HDL: 43 mg/dL — ABNORMAL LOW (ref >=50)
LDL Cholesterol (Calc): 48 mg/dL (calc)
Non-HDL Cholesterol (Calc): 75 mg/dL (calc) (ref <130)
Total CHOL/HDL Ratio: 2.7 (calc) (ref <5.0)
Triglycerides: 195 mg/dL — ABNORMAL HIGH (ref <150)

## 2020-05-01 LAB — HEMOGLOBIN A1C
Hgb A1c MFr Bld: 7.3 % of total Hgb — ABNORMAL HIGH (ref <5.7)
Mean Plasma Glucose: 163 (calc)
eAG (mmol/L): 9 (calc)

## 2020-05-01 LAB — COMPREHENSIVE METABOLIC PANEL
AG Ratio: 1.7 (calc) (ref 1.0–2.5)
ALT: 32 U/L — ABNORMAL HIGH (ref 6–29)
AST: 27 U/L (ref 10–35)
Albumin: 4.5 g/dL (ref 3.6–5.1)
Alkaline phosphatase (APISO): 82 U/L (ref 37–153)
BUN: 18 mg/dL (ref 7–25)
CO2: 25 mmol/L (ref 20–32)
Calcium: 10.1 mg/dL (ref 8.6–10.4)
Chloride: 101 mmol/L (ref 98–110)
Creat: 0.79 mg/dL (ref 0.60–0.93)
Globulin: 2.7 g/dL (calc) (ref 1.9–3.7)
Glucose, Bld: 82 mg/dL (ref 65–99)
Potassium: 4.6 mmol/L (ref 3.5–5.3)
Sodium: 142 mmol/L (ref 135–146)
Total Bilirubin: 0.4 mg/dL (ref 0.2–1.2)
Total Protein: 7.2 g/dL (ref 6.1–8.1)

## 2020-05-04 NOTE — Progress Notes (Signed)
Assessment/Plan:   1.  Dystonic tremor  -Patient symptoms most consistent with a dystonic tremor.  Tremor notable in the head and hands, left more so than right.  We discussed nature and pathophysiology.  We discussed treatments, including Botox.  Discussed medication treatments, although this can be somewhat medication resistant.  Ultimately, after a discussion of risks, benefits, and side effects, she ultimately decided to hold off, stating that it was not all that bothersome.  If new medical issues arise or it becomes more bothersome, she was instructed to call me and make a follow-up appointment.  She was given patient information/education today.  Subjective:   Candice Guerrero was seen today in the movement disorders clinic for neurologic consultation at the request of [name], MD.  The consultation is for the evaluation of L hand intention tremor. Outside records that were made available to me were reviewed.  Tremor: Yes.     How long has it been going on? Dx noted in records at least since 2015 but pt reports been going on for one year  At rest or with activation?  Activation - she is L handed  When is it noted the most?  Holding glass and writing  Fam hx of tremor?  Yes.  , mom with tremor (no known dx)  Located where?  L hand and family notes head tremor  Affected by caffeine:  No. (only drinks 1 cup coffee/day - she is diabetic and if doesn't eat will note more tremor)  Affected by alcohol:  Yes.  , improved  Affected by stress:  Yes.    Affected by fatigue:  No.  Spills soup if on spoon:  No.  Spills glass of liquid if full:  No.  Affects ADL's (tying shoes, brushing teeth, etc):  No.  Tremor inducing meds:  No.   Other Specific Symptoms:  Voice: some "croakiness" but better than in the past when had trouble Sleep:   Vivid Dreams:  Yes.    Acting out dreams:  occ sleep talk Wet Pillows: a little Postural symptoms:  Yes.   , "a little due to pinched nerve" - saw Dr.  Ernestina Patches  Falls?  No. Bradykinesia symptoms: no bradykinesia noted Loss of smell:  No. Loss of taste:  No. Urinary Incontinence:  No. Difficulty Swallowing:  No. Depression:  No. Memory changes:  No. N/V:  No. Lightheaded:  No.  Syncope: No. Diplopia:  No. Dyskinesia:  No.  Neuroimaging of the brain has not previously been performed in our system but states that years ago had one done (mri) in [place] - probably 15 years ago.    ALLERGIES:   Allergies  Allergen Reactions  . Oxycodone Nausea Only  . Altace [Ramipril] Cough    CURRENT MEDICATIONS:  Current Outpatient Medications  Medication Instructions  . aspirin-acetaminophen-caffeine (EXCEDRIN MIGRAINE) 250-250-65 MG per tablet 1 tablet, Oral, Every 6 hours PRN, Reported on 10/21/2015  . aspirin 81 mg, Daily  . atorvastatin (LIPITOR) 40 mg, Oral, Daily  . calcipotriene-betamethasone (TACLONEX) ointment Topical, Daily  . Calcium Carbonate (CALTRATE 600) 1500 MG TABS Daily  . citalopram (CELEXA) 20 mg, Oral, Daily  . Cyanocobalamin (VITAMIN B 12 PO) Oral  . metFORMIN (GLUCOPHAGE) 1,000 mg, Oral, 2 times daily with meals  . omeprazole (PRILOSEC) 40 MG capsule TAKE 1 CAPSULE DAILY  . triamcinolone cream (KENALOG) 0.1 % APPLY TO AFFECTED AREA TWICE A DAY  . valsartan-hydrochlorothiazide (DIOVAN-HCT) 80-12.5 MG tablet TAKE 1 TABLET DAILY  . Vitamin  D, Ergocalciferol, (DRISDOL) 1.25 MG (50000 UNIT) CAPS capsule TAKE 1 CAPSULE ONCE EVERY 7 DAYS    Objective:   PHYSICAL EXAMINATION:    VITALS:   Vitals:   05/18/20 0843  BP: 135/82  Pulse: (!) 104  SpO2: 98%  Weight: 157 lb (71.2 kg)  Height: 5' 3.75" (1.619 m)    GEN:  The patient appears stated age and is in NAD. HEENT:  Normocephalic, atraumatic.  The mucous membranes are moist. The superficial temporal arteries are without ropiness or tenderness. CV:  RRR Lungs:  CTAB Neck/HEME:  There are no carotid bruits bilaterally.  Neck is turned to the right.     Neurological examination:  Orientation: The patient is alert and oriented x3.  Cranial nerves: There is good facial symmetry.  Extraocular muscles are intact. The visual fields are full to confrontational testing. The speech is fluent and clear. Soft palate rises symmetrically and there is no tongue deviation. Hearing is intact to conversational tone. Sensation: Sensation is intact to light touch throughout (facial, trunk, extremities). Vibration is intact at the bilateral big toe. There is no extinction with double simultaneous stimulation.  Motor: Strength is 5/5 in the bilateral upper and lower extremities.   Shoulder shrug is equal and symmetric.  There is no pronator drift. Deep tendon reflexes: Deep tendon reflexes are 0-1/4 at the bilateral biceps, triceps, brachioradialis, patella and achilles. Plantar responses are downgoing bilaterally.  Movement examination: Tone: There is normal tone in the bilateral upper extremities.  The tone in the lower extremities is normal.  Abnormal movements: she has head tremor in the "no" direction.  It is irregular.  She also has irregular tremor of the left hand.  It is noted at rest when she is drawing with the right hand (she is left-hand dominant).  She has some tremor with Archimedes spirals when drawing with the left hand.  She has no postural tremor.  No tremors noted when approximating the 2 hands together.  She has some tremor when pouring water from 1 glass to another, but does not spill much. Coordination:  There is no decremation with RAM's, with any form of RAMS, including alternating supination and pronation of the forearm, hand opening and closing, finger taps, heel taps and toe taps. Gait and Station: The patient has no difficulty arising out of a deep-seated chair without the use of the hands. The patient is wide-based.  She is steady. I have reviewed and interpreted the following labs independently   Chemistry      Component Value  Date/Time   NA 142 04/30/2020 1540   K 4.6 04/30/2020 1540   CL 101 04/30/2020 1540   CO2 25 04/30/2020 1540   BUN 18 04/30/2020 1540   CREATININE 0.79 04/30/2020 1540      Component Value Date/Time   CALCIUM 10.1 04/30/2020 1540   ALKPHOS 93 12/30/2019 1132   AST 27 04/30/2020 1540   ALT 32 (H) 04/30/2020 1540   BILITOT 0.4 04/30/2020 1540      Lab Results  Component Value Date   TSH 2.00 12/30/2019   Lab Results  Component Value Date   WBC 7.2 12/30/2019   HGB 13.7 12/30/2019   HCT 42.8 12/30/2019   MCV 85.1 12/30/2019   PLT 313.0 12/30/2019      Total time spent on today's visit was 45 minutes, including both face-to-face time and nonface-to-face time.  Time included that spent on review of records (prior notes available to me/labs/imaging if pertinent), discussing  treatment and goals, answering patient's questions and coordinating care.  Cc:  [name], MD

## 2020-05-10 DIAGNOSIS — M25562 Pain in left knee: Secondary | ICD-10-CM | POA: Diagnosis not present

## 2020-05-10 DIAGNOSIS — M7052 Other bursitis of knee, left knee: Secondary | ICD-10-CM | POA: Diagnosis not present

## 2020-05-12 ENCOUNTER — Telehealth: Payer: Self-pay | Admitting: Family Medicine

## 2020-05-12 NOTE — Progress Notes (Signed)
  Chronic Care Management   Note  05/12/2020 Name: Candice Guerrero MRN: [id number] DOB: [birth year]  Candice Guerrero is a 69 y.o. year old female who is a primary care patient of [name], MD. I reached out to Deondra Labrador by phone today in response to a referral sent by Candice Guerrero's PCP, [name], MD.   Candice Guerrero was given information about Chronic Care Management services today including:  1. CCM service includes personalized support from designated clinical staff supervised by her physician, including individualized plan of care and coordination with other care providers 2. 24/7 contact phone numbers for assistance for urgent and routine care needs. 3. Service will only be billed when office clinical staff spend 20 minutes or more in a month to coordinate care. 4. Only one practitioner may furnish and bill the service in a calendar month. 5. The patient may stop CCM services at any time (effective at the end of the month) by phone call to the office staff.   Patient agreed to services and verbal consent obtained.   Follow up plan:   Earney [place] [employer] Scheduler

## 2020-05-18 ENCOUNTER — Encounter: Payer: Self-pay | Admitting: Neurology

## 2020-05-18 ENCOUNTER — Ambulatory Visit: Payer: Medicare HMO | Admitting: Neurology

## 2020-05-18 ENCOUNTER — Other Ambulatory Visit: Payer: Self-pay

## 2020-05-18 VITALS — BP 135/82 | HR 104 | Ht 63.75 in | Wt 157.0 lb

## 2020-05-18 DIAGNOSIS — G249 Dystonia, unspecified: Secondary | ICD-10-CM | POA: Diagnosis not present

## 2020-05-18 DIAGNOSIS — G243 Spasmodic torticollis: Secondary | ICD-10-CM

## 2020-05-18 NOTE — Patient Instructions (Signed)
You have dystonic tremor.  We have discussed medications, botox and others, and you have decided to hold on those for now.  Let me know if you change your mind or if new symptoms develop and we can re-evaluate you.  It was my pleasure seeing you today!  The physicians and staff at [facility] are committed to providing excellent care. You may receive a survey requesting feedback about your experience at our office. We strive to receive "very good" responses to the survey questions. If you feel that your experience would prevent you from giving the office a "very good " response, please contact our office to try to remedy the situation. We may be reached at [phone]. Thank you for taking the time out of your busy day to complete the survey.

## 2020-05-21 ENCOUNTER — Other Ambulatory Visit: Payer: Self-pay | Admitting: Family Medicine

## 2020-05-21 NOTE — Telephone Encounter (Signed)
Called l/m to call office need to see what dose of the metformin she is taking

## 2020-05-24 ENCOUNTER — Other Ambulatory Visit: Payer: Self-pay | Admitting: Family Medicine

## 2020-05-25 DIAGNOSIS — M7052 Other bursitis of knee, left knee: Secondary | ICD-10-CM | POA: Diagnosis not present

## 2020-05-31 ENCOUNTER — Ambulatory Visit: Payer: Medicare HMO

## 2020-06-01 DIAGNOSIS — R69 Illness, unspecified: Secondary | ICD-10-CM | POA: Diagnosis not present

## 2020-06-01 DIAGNOSIS — M7052 Other bursitis of knee, left knee: Secondary | ICD-10-CM | POA: Diagnosis not present

## 2020-06-02 DIAGNOSIS — R69 Illness, unspecified: Secondary | ICD-10-CM | POA: Diagnosis not present

## 2020-06-08 DIAGNOSIS — M7052 Other bursitis of knee, left knee: Secondary | ICD-10-CM | POA: Diagnosis not present

## 2020-06-15 DIAGNOSIS — M25562 Pain in left knee: Secondary | ICD-10-CM | POA: Diagnosis not present

## 2020-06-15 DIAGNOSIS — M7052 Other bursitis of knee, left knee: Secondary | ICD-10-CM | POA: Diagnosis not present

## 2020-06-22 ENCOUNTER — Ambulatory Visit
Admission: RE | Admit: 2020-06-22 | Discharge: 2020-06-22 | Disposition: A | Discharge status: Home / Self Care | Payer: Medicare HMO | Source: Ambulatory Visit | Attending: Family Medicine | Admitting: Family Medicine

## 2020-06-22 ENCOUNTER — Other Ambulatory Visit: Payer: Self-pay

## 2020-06-22 DIAGNOSIS — Z1231 Encounter for screening mammogram for malignant neoplasm of breast: Secondary | ICD-10-CM | POA: Diagnosis not present

## 2020-06-28 DIAGNOSIS — M25562 Pain in left knee: Secondary | ICD-10-CM | POA: Diagnosis not present

## 2020-07-12 ENCOUNTER — Other Ambulatory Visit: Payer: Self-pay | Admitting: Family Medicine

## 2020-07-12 DIAGNOSIS — M7052 Other bursitis of knee, left knee: Secondary | ICD-10-CM | POA: Diagnosis not present

## 2020-07-12 DIAGNOSIS — M1712 Unilateral primary osteoarthritis, left knee: Secondary | ICD-10-CM | POA: Diagnosis not present

## 2020-07-23 ENCOUNTER — Encounter: Payer: Self-pay | Admitting: Family Medicine

## 2020-07-23 ENCOUNTER — Other Ambulatory Visit: Payer: Self-pay

## 2020-07-23 MED ORDER — METFORMIN HCL ER (OSM) 500 MG PO TB24
1000.0000 mg | ORAL_TABLET | Freq: Two times a day (BID) | ORAL | 3 refills | Status: DC
Start: 2020-07-23 — End: 2021-05-13

## 2020-08-03 DIAGNOSIS — R69 Illness, unspecified: Secondary | ICD-10-CM | POA: Diagnosis not present

## 2020-08-06 DIAGNOSIS — R69 Illness, unspecified: Secondary | ICD-10-CM | POA: Diagnosis not present

## 2020-08-11 ENCOUNTER — Telehealth: Payer: Self-pay

## 2020-08-11 NOTE — Telephone Encounter (Signed)
Left L5 TF on 03/13/2019. Ok to repeat if helped, same problem/side, and no new injury?

## 2020-08-11 NOTE — Telephone Encounter (Signed)
ok 

## 2020-08-11 NOTE — Telephone Encounter (Signed)
Patient called in wanting to get sch with dr newton for lumbar injection

## 2020-08-11 NOTE — Telephone Encounter (Signed)
Needs auth for [phone]. Patient has not been scheduled.

## 2020-08-12 ENCOUNTER — Telehealth: Payer: Self-pay

## 2020-08-12 NOTE — Telephone Encounter (Signed)
Called back and sch 11/3

## 2020-08-12 NOTE — Telephone Encounter (Signed)
patient called in wanting to know if she got approved from insurance for injection

## 2020-08-18 ENCOUNTER — Ambulatory Visit: Payer: Self-pay

## 2020-08-18 ENCOUNTER — Ambulatory Visit (INDEPENDENT_AMBULATORY_CARE_PROVIDER_SITE_OTHER): Payer: Medicare HMO | Admitting: Physical Medicine and Rehabilitation

## 2020-08-18 ENCOUNTER — Other Ambulatory Visit: Payer: Self-pay

## 2020-08-18 ENCOUNTER — Encounter: Payer: Self-pay | Admitting: Physical Medicine and Rehabilitation

## 2020-08-18 VITALS — BP 134/90 | HR 97

## 2020-08-18 DIAGNOSIS — G8929 Other chronic pain: Secondary | ICD-10-CM

## 2020-08-18 DIAGNOSIS — M5416 Radiculopathy, lumbar region: Secondary | ICD-10-CM | POA: Diagnosis not present

## 2020-08-18 DIAGNOSIS — M5116 Intervertebral disc disorders with radiculopathy, lumbar region: Secondary | ICD-10-CM

## 2020-08-18 MED ORDER — METHYLPREDNISOLONE ACETATE 80 MG/ML IJ SUSP
80.0000 mg | Freq: Once | INTRAMUSCULAR | Status: AC
  Administered 2020-08-18: 80 mg

## 2020-08-18 NOTE — Progress Notes (Signed)
Pt state lower back pain that travels down her left thigh to her foot. Pt state when cleaning house makes the pain worse. Pt state she takes pain meds and use ice to ease the pain.  Numeric Pain Rating Scale and Functional Assessment Average Pain 7   In the last MONTH (on 0-10 scale) has pain interfered with the following?  1. General activity like being  able to carry out your everyday physical activities such as walking, climbing stairs, carrying groceries, or moving a chair?  Rating(10)   +Driver, -BT, -Dye Allergies.

## 2020-08-27 DIAGNOSIS — Z86006 Personal history of melanoma in-situ: Secondary | ICD-10-CM | POA: Diagnosis not present

## 2020-08-27 DIAGNOSIS — D2271 Melanocytic nevi of right lower limb, including hip: Secondary | ICD-10-CM | POA: Diagnosis not present

## 2020-08-27 DIAGNOSIS — D2272 Melanocytic nevi of left lower limb, including hip: Secondary | ICD-10-CM | POA: Diagnosis not present

## 2020-08-27 DIAGNOSIS — L814 Other melanin hyperpigmentation: Secondary | ICD-10-CM | POA: Diagnosis not present

## 2020-08-27 DIAGNOSIS — L578 Other skin changes due to chronic exposure to nonionizing radiation: Secondary | ICD-10-CM | POA: Diagnosis not present

## 2020-08-27 DIAGNOSIS — D225 Melanocytic nevi of trunk: Secondary | ICD-10-CM | POA: Diagnosis not present

## 2020-08-27 DIAGNOSIS — L400 Psoriasis vulgaris: Secondary | ICD-10-CM | POA: Diagnosis not present

## 2020-08-27 DIAGNOSIS — L821 Other seborrheic keratosis: Secondary | ICD-10-CM | POA: Diagnosis not present

## 2020-08-31 ENCOUNTER — Other Ambulatory Visit: Payer: Self-pay

## 2020-08-31 ENCOUNTER — Telehealth: Payer: Self-pay | Admitting: Physical Medicine and Rehabilitation

## 2020-08-31 ENCOUNTER — Ambulatory Visit (INDEPENDENT_AMBULATORY_CARE_PROVIDER_SITE_OTHER): Payer: Medicare HMO | Admitting: Family Medicine

## 2020-08-31 ENCOUNTER — Encounter: Payer: Self-pay | Admitting: Physical Medicine and Rehabilitation

## 2020-08-31 ENCOUNTER — Encounter: Payer: Self-pay | Admitting: Family Medicine

## 2020-08-31 VITALS — BP 132/80 | HR 82 | Temp 97.5°F | Resp 18 | Ht 64.0 in | Wt 152.0 lb

## 2020-08-31 DIAGNOSIS — E119 Type 2 diabetes mellitus without complications: Secondary | ICD-10-CM

## 2020-08-31 DIAGNOSIS — Z79899 Other long term (current) drug therapy: Secondary | ICD-10-CM | POA: Diagnosis not present

## 2020-08-31 DIAGNOSIS — Z0000 Encounter for general adult medical examination without abnormal findings: Secondary | ICD-10-CM

## 2020-08-31 DIAGNOSIS — E1159 Type 2 diabetes mellitus with other circulatory complications: Secondary | ICD-10-CM

## 2020-08-31 DIAGNOSIS — E1169 Type 2 diabetes mellitus with other specified complication: Secondary | ICD-10-CM | POA: Diagnosis not present

## 2020-08-31 DIAGNOSIS — E785 Hyperlipidemia, unspecified: Secondary | ICD-10-CM

## 2020-08-31 DIAGNOSIS — I152 Hypertension secondary to endocrine disorders: Secondary | ICD-10-CM | POA: Diagnosis not present

## 2020-08-31 MED ORDER — OMEPRAZOLE 20 MG PO CPDR: 20.0000 mg | DELAYED_RELEASE_CAPSULE | Freq: Every day | ORAL | 3 refills | Status: DC

## 2020-08-31 NOTE — Progress Notes (Signed)
Phone [phone]   Subjective:  Patient presents today for their annual physical. Chief complaint-noted.   See problem oriented charting- ROS- full  review of systems was completed and negative except for: long term head sweats, near sighted, low back pain, seasonal allergies, tremor left hand, numbness left leg  The following were reviewed and entered/updated in epic: Past Medical History:  Diagnosis Date  . Abnormal mammogram of right breast 07/18/2017  . Allergy    SEASONAL  . Benign hematuria   . Cataract    LEFT EYE  . Dental crowns present   . Depression   . Diabetes mellitus    NIDDM  . GERD (gastroesophageal reflux disease)   . Headache(784.0)    migraines, sinus  . Hiatal hernia   . Hyperlipidemia   . Hypertension    under control, has been on med. since 2005  . Osteopenia   . Psoriasis    arms, legs, back  . Snores    denies sx. of sleep apnea   Patient Active Problem List   Diagnosis Date Noted  . Diabetes mellitus type II, controlled, with no complications ([place]) 93/23/5573    Priority: High  . Tremor 04/30/2020    Priority: Medium  . DDD (degenerative disc disease), cervical 01/29/2020    Priority: Medium  . Osteoporosis 07/24/2014    Priority: Medium  . IBS (irritable bowel syndrome) 07/24/2014    Priority: Medium  . Psoriasis 12/13/2010    Priority: Medium  . Hypertension associated with diabetes ([place]) 12/13/2010    Priority: Medium  . Depression with anxiety 12/13/2010    Priority: Medium  . Fatty liver 12/13/2010    Priority: Medium  . Hyperlipidemia associated with type 2 diabetes mellitus ([place]) 12/13/2010    Priority: Medium  . Acute midline low back pain with left-sided sciatica 11/06/2018    Priority: Low  . Abnormal mammogram of right breast 07/18/2017    Priority: Low  . Allergic rhinitis 07/24/2014    Priority: Low  . Obesity (BMI 30-39.9) 12/31/2013    Priority: Low  . Benign hematuria 12/13/2010    Priority: Low  . GERD  (gastroesophageal reflux disease) 12/13/2010    Priority: Low  . Hamstring tendinitis of left thigh 12/25/2019   Past Surgical History:  Procedure Laterality Date  . BREAST LUMPECTOMY WITH RADIOACTIVE SEED LOCALIZATION Right 07/18/2017   Procedure: RIGHT BREAST LUMPECTOMY WITH RADIOACTIVE SEED LOCALIZATION;  Surgeon: Fanny Skates, MD;  Location: [facility];  Service: General;  Laterality: Right;  . COLONOSCOPY    . DILATION AND CURETTAGE OF UTERUS    . HARDWARE REMOVAL  07/11/2012   Procedure: HARDWARE REMOVAL;  Surgeon: Cammie Sickle., MD;  Location: [facility];  Service: Orthopedics;  Laterality: Left;  pin removal wire out and debride pisiform  . left wrist surgery     July 9th 2013  . TONSILLECTOMY    . TOOTH EXTRACTION Right 12/13/2016   Procedure: SURGICAL REMOVAL OF ODONTOGENIC TUMOR AND REMOVAL OF IMPACTED TOOTH #1;  Surgeon: Jannette Fogo, DDS;  Location: [facility];  Service: Oral Surgery;  Laterality: Right;  . TRIGGER FINGER RELEASE  07/11/2012   Procedure: RELEASE TRIGGER FINGER/A-1 PULLEY;  Surgeon: Cammie Sickle., MD;  Location: [facility];  Service: Orthopedics;  Laterality: Left;  LEFT LONG FINGER    Family History  Problem Relation Age of Onset  . Hyperlipidemia Mother   . Hypertension Mother   . Tremor Mother   .  Hypertension Father   . Kidney disease Father   . Renal Disease Father   . Anesthesia problems Sister        post-op N/V, hard to wake up post-op  . Diabetes Maternal Uncle   . High blood pressure Brother   . Prostate cancer Brother   . Healthy Son   . Healthy Son     Medications- reviewed and updated Current Outpatient Medications  Medication Sig Dispense Refill  . aspirin-acetaminophen-caffeine (EXCEDRIN MIGRAINE) 250-250-65 MG per tablet Take 1 tablet by mouth every 6 (six) hours as needed. Reported on 10/21/2015    . atorvastatin (LIPITOR) 40 MG tablet Take 40 mg by mouth daily.     . calcipotriene-betamethasone (TACLONEX) ointment Apply topically daily. 60 g 0  . Calcium Carbonate (CALTRATE 600) 1500 MG TABS Take by mouth daily.      . citalopram (CELEXA) 20 MG tablet Take 1 tablet (20 mg total) by mouth daily. 90 tablet 3  . Cyanocobalamin (VITAMIN B 12 PO) Take by mouth.    . metformin (FORTAMET) 500 MG (OSM) 24 hr tablet Take 2 tablets (1,000 mg total) by mouth 2 (two) times daily with a meal. 360 tablet 3  . triamcinolone cream (KENALOG) 0.1 % APPLY TO AFFECTED AREA TWICE A DAY    . valsartan-hydrochlorothiazide (DIOVAN-HCT) 80-12.5 MG tablet TAKE 1 TABLET DAILY 90 tablet 3  . Vitamin D, Ergocalciferol, (DRISDOL) 1.25 MG (50000 UNIT) CAPS capsule TAKE 1 CAPSULE ONCE EVERY 7 DAYS 8 capsule 0  . omeprazole (PRILOSEC) 20 MG capsule Take 1 capsule (20 mg total) by mouth daily. 90 capsule 3   No current facility-administered medications for this visit.    Allergies-reviewed and updated Allergies  Allergen Reactions  . Oxycodone Nausea Only  . Altace [Ramipril] Cough    Social History   Social History Narrative   Moved to [place] in 2011 from [place] due to more affordable.       Married 1972 Mortimer Fries). 2 kids. 5 grandkids. 2 in [place], 3 [place].       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       Hobbies: babysitting, casino      Left handed   Objective  Objective:  BP 132/80   Pulse 82   Temp (!) 97.5 F (36.4 C) (Temporal)   Resp 18   Ht 5\' 4"  (1.626 m)   Wt 152 lb (68.9 kg)   LMP  (LMP Unknown)   SpO2 98%   BMI 26.09 kg/m  Gen: NAD, resting comfortably, sweat in scalp noted HEENT: Mucous membranes are moist. Oropharynx normal Neck: no thyromegaly CV: RRR no murmurs rubs or gallops Lungs: CTAB no crackles, wheeze, rhonchi Abdomen: soft/nontender/nondistended/normal bowel sounds. No rebound or guarding.  Ext: no edema Skin: warm, dry Neuro: grossly normal, moves all extremities, PERRLA   Assessment  and Plan   69 y.o. female presenting for annual physical.  Health Maintenance counseling: 1. Anticipatory guidance: Patient counseled regarding regular dental exams q6 months, eye exams,  avoiding smoking and second hand smoke, limiting alcohol to 1 beverage per day(weekends perhaps).   2. Risk factor reduction:  Advised patient of need for regular exercise and diet rich and fruits and vegetables to reduce risk of heart attack and stroke. Exercise- walking more regularly. Diet- cut down on portion sizes. Down 8 lbs since july! Congratulated her on progress Wt Readings from Last 3 Encounters:  08/31/20 152 lb (68.9 kg)  05/18/20 157  lb (71.2 kg)  04/30/20 160 lb (72.6 kg)  3. Immunizations/screenings/ancillary studies- holding off on booster for now- wants to wait until after the holidays (had sweats, woke up laying on the ground not in pain and didn't remember getting down on the floor, and out of it the next day)- will get before [place]th [place] trip. Had flu shot 2 weeks ago Immunization History  Administered Date(s) Administered  . Fluad Quad(high Dose 65+) 08/17/2020  . Influenza Split 08/09/2011, 08/13/2012  . Influenza, High Dose Seasonal PF 08/17/2016, 07/30/2017, 07/23/2018, 07/23/2019  . Influenza,inj,Quad PF,6+ Mos 06/30/2013, 07/24/2014, 08/09/2015  . Influenza-Unspecified 07/23/2019  . PFIZER SARS-COV-2 Vaccination 11/07/2019, 11/28/2019  . Pneumococcal Conjugate-13 12/31/2013  . Pneumococcal Polysaccharide-23 01/26/2015  . Tdap 10/16/2002, 02/24/2013  . Zoster 12/13/2010  . Zoster Recombinat (Shingrix) 03/14/2017, 05/21/2017   4. Cervical cancer screening- Dr. Landry Mellow gynecology- she still gets pelvic exams 5. Breast cancer screening-  breast exam with GYN and mammogram - 06/22/20 6. Colon cancer screening - 04/30/14 with 10 year repeat planned 7. Skin cancer screening- saw dermatology last week. advised regular sunscreen use. Denies worrisome, changing, or new skin lesions.   8. Birth control/STD check- postmenopause/monogamous  9. Osteoporosis screening at 39- this is followed by gynecology.actonel 5 years in past- now just calcium and vitamin D (once a week high dose). Walks for weight bearing -Former smoker- quit in 20000. Prior blood in urine was diagnosed with benign hematuria. Small benign cyst on kidney may be contributing. Will defer urine testing for now. Urine microscopic last year negative.   Status of chronic or acute concerns   # Diabetes S: Medication:Metformin 1000 mg twice daily extended release. Ws not able to get all of these from insurance- does ER in AM and IR in PM. Lab Results  Component Value Date   HGBA1C 7.3 (H) 04/30/2020   HGBA1C 7.7 (H) 12/30/2019   HGBA1C 7.1 (A) 09/02/2019   A/P: hopefully controlled- update a1c with labs today. Continue current meds for now.   #hypertension S: medication: valsartan Hydrochlorothiazide 80-12.5 mg.s BP Readings from Last 3 Encounters:  08/31/20 132/80  08/18/20 134/90  05/18/20 135/82  A/P: Stable. Continue current medications.   #hyperlipidemia S: Medication: atorvastatin 40 mg Lab Results  Component Value Date   CHOL 118 04/30/2020   HDL 43 (L) 04/30/2020   LDLCALC 48 04/30/2020   LDLDIRECT 58.0 12/30/2019   TRIG 195 (H) 04/30/2020   CHOLHDL 2.7 04/30/2020   A/P: controlled last check- check LDL - discussed stopping aspirin for primary prevention  # Depression S: Medication:Citalopram 20 mg.  Cymbalta was not as effective.  Patient with history of sweatiness on hands-seem to be worse on Cymbalta as well.-Omeprazole has also been linked to sweating.   Depression screen [facility] 2/9 08/31/2020 04/30/2020 04/30/2020  Decreased Interest 0 0 0  Down, Depressed, Hopeless 0 0 0  PHQ - 2 Score 0 0 0  Altered sleeping 0 1 -  Tired, decreased energy 0 0 -  Change in appetite - 0 -  Feeling bad or failure about yourself  0 0 -  Trouble concentrating 0 0 -  Moving slowly or fidgety/restless 0 0  -  Suicidal thoughts 0 0 -  PHQ-9 Score 0 1 -  Difficult doing work/chores - Not difficult at all -  Some recent data might be hidden  A/P: full remission. Discussed possibly 10 mg trial- she would like to maintain current dose  # fatty liver- numbers stable last check. Noted on  ultrasoudn 2014- continue to work on lifestyle- see weight loss above. Lab Results  Component Value Date   ALT 32 (H) 04/30/2020   AST 27 04/30/2020   ALKPHOS 93 12/30/2019   BILITOT 0.4 04/30/2020   #Tremor-possibly essential tremor-referred to Dr. Carles Collet at last visit per patient request.  Also may have slight tremor in the head/neck potentially essential tremor. Was told dystolinc tremor but she wants to hold off on meds for now   #working with Dr. Ernestina Patches with left leg issues including left foot tingling- considering injections at s1.  - Husband is not doing great after his neck surgery- they are both discouraged  # GERD S:Medication: prilosec 40mg . No breakthrough symptoms.   B12 levels related to PPI use: Lab Results  Component Value Date   VITAMINB12 222 04/28/2019  A/P: doing well- trial 20mg  dose. Can resend 40mg  if significant worsening.   - may need b12 supplement- prefer over 400 at this point  Recommended follow up: Return in about 4 months (around 12/29/2020) for follow up- or sooner if needed. Future Appointments  Date Time Provider [place]  05/06/2021 10:15 AM [facility]ACH [facility]   Lab/Order associations:non fasting   ICD-10-CM   1. Preventative health care  Z00.00 CBC With Differential/Platelet    COMPLETE METABOLIC PANEL WITH GFR    Hemoglobin A1c    LDL cholesterol, direct    Vitamin B12  2. Controlled type 2 diabetes mellitus without complication, without long-term current use of insulin (HCC)  E11.9 CBC With Differential/Platelet    COMPLETE METABOLIC PANEL WITH GFR    Hemoglobin A1c    CANCELED: Lipid Panel w/reflex Direct LDL  3. Hypertension associated  with diabetes ([place])  E11.59 CBC With Differential/Platelet   A44.9 COMPLETE METABOLIC PANEL WITH GFR    CANCELED: Lipid Panel w/reflex Direct LDL  4. Hyperlipidemia associated with type 2 diabetes mellitus (HCC)  E11.69 CBC With Differential/Platelet   P53.0 COMPLETE METABOLIC PANEL WITH GFR    LDL cholesterol, direct    CANCELED: Lipid Panel w/reflex Direct LDL  5. High risk medication use  Z79.899 Vitamin B12    Meds ordered this encounter  Medications  . omeprazole (PRILOSEC) 20 MG capsule    Sig: Take 1 capsule (20 mg total) by mouth daily.    Dispense:  90 capsule    Refill:  3    Return precautions advised.  [name], MD

## 2020-08-31 NOTE — Telephone Encounter (Signed)
Patient called returning Courtney's call. Please call patient at [phone].

## 2020-08-31 NOTE — Telephone Encounter (Signed)
See patient advice request. Patient scheduled.

## 2020-08-31 NOTE — Patient Instructions (Addendum)
Please stop by lab before you go If you have mychart- we will send your results within 3 business days of [place] receiving them.  If you do not have mychart- we will call you about results within 5 business days of [place] receiving them.  *please note we are currently using [facility] which has a longer processing time than [facility] typically so labs may not come back as quickly as in the past *please also note that you will see labs on mychart as soon as they post. I will later go in and write notes on them- will say "notes from Dr. Yong Channel"  Try 20 mg omeprazole when you run out of 40mg   We may start b12/cyanocoblamin 1000 mcg once a week or daily if numbers low normal. If truly low may consider injections  Recommended follow up: Return in about 4 months (around 12/29/2020) for follow up- or sooner if needed.

## 2020-09-01 ENCOUNTER — Encounter: Payer: Self-pay | Admitting: Family Medicine

## 2020-09-01 ENCOUNTER — Other Ambulatory Visit: Payer: Self-pay

## 2020-09-01 DIAGNOSIS — D72829 Elevated white blood cell count, unspecified: Secondary | ICD-10-CM

## 2020-09-01 LAB — CBC WITH DIFFERENTIAL/PLATELET
Absolute Monocytes: 1178 cells/uL — ABNORMAL HIGH (ref 200–950)
Basophils Absolute: 62 cells/uL (ref 0–200)
Basophils Relative: 0.5 %
Eosinophils Absolute: 124 cells/uL (ref 15–500)
Eosinophils Relative: 1 %
HCT: 44.6 % (ref 35.0–45.0)
Hemoglobin: 14.5 g/dL (ref 11.7–15.5)
Lymphs Abs: 3745 cells/uL (ref 850–3900)
MCH: 28.2 pg (ref 27.0–33.0)
MCHC: 32.5 g/dL (ref 32.0–36.0)
MCV: 86.8 fL (ref 80.0–100.0)
MPV: 13 fL — ABNORMAL HIGH (ref 7.5–12.5)
Monocytes Relative: 9.5 %
Neutro Abs: 7291 cells/uL (ref 1500–7800)
Neutrophils Relative %: 58.8 %
Platelets: 349 10*3/uL (ref 140–400)
RBC: 5.14 10*6/uL — ABNORMAL HIGH (ref 3.80–5.10)
RDW: 13.4 % (ref 11.0–15.0)
Total Lymphocyte: 30.2 %
WBC: 12.4 10*3/uL — ABNORMAL HIGH (ref 3.8–10.8)

## 2020-09-01 LAB — COMPLETE METABOLIC PANEL WITH GFR
AG Ratio: 1.8 (calc) (ref 1.0–2.5)
ALT: 28 U/L (ref 6–29)
AST: 29 U/L (ref 10–35)
Albumin: 4.6 g/dL (ref 3.6–5.1)
Alkaline phosphatase (APISO): 81 U/L (ref 37–153)
BUN: 20 mg/dL (ref 7–25)
CO2: 33 mmol/L — ABNORMAL HIGH (ref 20–32)
Calcium: 10.7 mg/dL — ABNORMAL HIGH (ref 8.6–10.4)
Chloride: 99 mmol/L (ref 98–110)
Creat: 0.9 mg/dL (ref 0.60–0.93)
GFR, Est African American: 75 mL/min/{1.73_m2} (ref >=60)
GFR, Est Non African American: 64 mL/min/{1.73_m2} (ref >=60)
Globulin: 2.5 g/dL (calc) (ref 1.9–3.7)
Glucose, Bld: 91 mg/dL (ref 65–99)
Potassium: 4.3 mmol/L (ref 3.5–5.3)
Sodium: 143 mmol/L (ref 135–146)
Total Bilirubin: 0.3 mg/dL (ref 0.2–1.2)
Total Protein: 7.1 g/dL (ref 6.1–8.1)

## 2020-09-01 LAB — HEMOGLOBIN A1C
Hgb A1c MFr Bld: 6.9 % of total Hgb — ABNORMAL HIGH (ref <5.7)
Mean Plasma Glucose: 151 (calc)
eAG (mmol/L): 8.4 (calc)

## 2020-09-01 LAB — LDL CHOLESTEROL, DIRECT: Direct LDL: 54 mg/dL (ref <100)

## 2020-09-01 LAB — VITAMIN B12: Vitamin B-12: 441 pg/mL (ref 200–1100)

## 2020-09-14 ENCOUNTER — Other Ambulatory Visit: Payer: Medicare HMO

## 2020-09-14 ENCOUNTER — Other Ambulatory Visit: Payer: Self-pay

## 2020-09-14 DIAGNOSIS — D72829 Elevated white blood cell count, unspecified: Secondary | ICD-10-CM

## 2020-09-15 ENCOUNTER — Ambulatory Visit (INDEPENDENT_AMBULATORY_CARE_PROVIDER_SITE_OTHER): Payer: Medicare HMO | Admitting: Physical Medicine and Rehabilitation

## 2020-09-15 ENCOUNTER — Other Ambulatory Visit: Payer: Self-pay

## 2020-09-15 ENCOUNTER — Ambulatory Visit: Payer: Self-pay

## 2020-09-15 ENCOUNTER — Encounter: Payer: Self-pay | Admitting: Physical Medicine and Rehabilitation

## 2020-09-15 VITALS — BP 128/78 | HR 93

## 2020-09-15 DIAGNOSIS — M48061 Spinal stenosis, lumbar region without neurogenic claudication: Secondary | ICD-10-CM

## 2020-09-15 DIAGNOSIS — M5416 Radiculopathy, lumbar region: Secondary | ICD-10-CM

## 2020-09-15 LAB — CBC WITH DIFFERENTIAL/PLATELET
Absolute Monocytes: 722 cells/uL (ref 200–950)
Basophils Absolute: 43 cells/uL (ref 0–200)
Basophils Relative: 0.5 %
Eosinophils Absolute: 95 cells/uL (ref 15–500)
Eosinophils Relative: 1.1 %
HCT: 43 % (ref 35.0–45.0)
Hemoglobin: 13.9 g/dL (ref 11.7–15.5)
Lymphs Abs: 2313 cells/uL (ref 850–3900)
MCH: 28.4 pg (ref 27.0–33.0)
MCHC: 32.3 g/dL (ref 32.0–36.0)
MCV: 87.8 fL (ref 80.0–100.0)
MPV: 13.4 fL — ABNORMAL HIGH (ref 7.5–12.5)
Monocytes Relative: 8.4 %
Neutro Abs: 5427 cells/uL (ref 1500–7800)
Neutrophils Relative %: 63.1 %
Platelets: 309 10*3/uL (ref 140–400)
RBC: 4.9 10*6/uL (ref 3.80–5.10)
RDW: 13 % (ref 11.0–15.0)
Total Lymphocyte: 26.9 %
WBC: 8.6 10*3/uL (ref 3.8–10.8)

## 2020-09-15 LAB — BASIC METABOLIC PANEL
BUN: 18 mg/dL (ref 7–25)
CO2: 32 mmol/L (ref 20–32)
Calcium: 9.9 mg/dL (ref 8.6–10.4)
Chloride: 100 mmol/L (ref 98–110)
Creat: 0.92 mg/dL (ref 0.60–0.93)
Glucose, Bld: 211 mg/dL — ABNORMAL HIGH (ref 65–99)
Potassium: 3.9 mmol/L (ref 3.5–5.3)
Sodium: 142 mmol/L (ref 135–146)

## 2020-09-15 MED ORDER — METHYLPREDNISOLONE ACETATE 80 MG/ML IJ SUSP
80.0000 mg | Freq: Once | INTRAMUSCULAR | Status: AC
  Administered 2020-09-15: 80 mg

## 2020-09-15 NOTE — Progress Notes (Signed)
Pt state lower back pain that travels down her left thigh that feels numb at times. Pt state that the pain travels to the side of her left foot she feels tingling. Pt state it hard for her to sleep at night. Pt state standing for a long time makes the pain worse. Pt state she will sit down to help ease the pain. Pt has hx of inj on 11/3 pt state that the inj was great but took two weeks to kick-in.  Numeric Pain Rating Scale and Functional Assessment Average Pain 2   In the last MONTH (on 0-10 scale) has pain interfered with the following?  1. General activity like being  able to carry out your everyday physical activities such as walking, climbing stairs, carrying groceries, or moving a chair?  Rating(4)   +Driver, -BT, -Dye Allergies.

## 2020-09-26 ENCOUNTER — Encounter: Payer: Self-pay | Admitting: Family Medicine

## 2020-10-04 ENCOUNTER — Other Ambulatory Visit: Payer: Self-pay | Admitting: Family Medicine

## 2020-10-17 ENCOUNTER — Encounter: Payer: Self-pay | Admitting: Physical Medicine and Rehabilitation

## 2020-10-17 NOTE — Progress Notes (Signed)
Candice Guerrero - 70 y.o. female MRN [id number]  Date of birth: 26-Sep-1951  Office Visit Note: Visit Date: 09/15/2020 PCP: Shelva Majestic, MD Referred by: Shelva Majestic, MD  Subjective: Chief Complaint  Patient presents with  . Lower Back - Pain  . Left Thigh - Numbness  . Left Foot - Pain   HPI:  Candice Guerrero is a 70 y.o. female who comes in today for planned repeat Left S1-2 Lumbar epidural steroid injection with fluoroscopic guidance.  The patient has failed conservative care including home exercise, medications, time and activity modification.  This injection will be diagnostic and hopefully therapeutic.  Please see requesting physician notes for further details and justification. Patient received more than 50% pain relief from prior injection.  Her symptoms are little bit more of an S1 distribution posterior lateral into the lateral foot and anterior foot.  We will complete S1 injection today as a differential approach to getting some medication through this area.  If she does not get good relief with this we may have to look at updated MRI of the lumbar spine versus different trial of medication.  She has had gabapentin in the past.  Referring: Gaspar Bidding, DO and Tana Conch, MD  ROS Otherwise per HPI.  Assessment & Plan: Visit Diagnoses:    ICD-10-CM   1. Lumbar radiculopathy  M54.16 XR C-ARM NO REPORT    Epidural Steroid injection    methylPREDNISolone acetate (DEPO-MEDROL) injection 80 mg  2. Bilateral stenosis of lateral recess of lumbar spine  M48.061 XR C-ARM NO REPORT    Epidural Steroid injection    methylPREDNISolone acetate (DEPO-MEDROL) injection 80 mg    Plan: No additional findings.   Meds & Orders:  Meds ordered this encounter  Medications  . methylPREDNISolone acetate (DEPO-MEDROL) injection 80 mg    Orders Placed This Encounter  Procedures  . XR C-ARM NO REPORT  . Epidural Steroid injection    Follow-up: Return if symptoms worsen or fail to  improve.   Procedures: No procedures performed  S1 Lumbosacral Transforaminal Epidural Steroid Injection - Sub-Pedicular Approach with Fluoroscopic Guidance   Patient: Candice Guerrero      Date of Birth: April 17, 1951 MRN: [id number] PCP: Shelva Majestic, MD      Visit Date: 09/15/2020   Universal Protocol:    Date/Time: 01/02/221:28 PM  Consent Given By: the patient  Position:  PRONE  Additional Comments: Vital signs were monitored before and after the procedure. Patient was prepped and draped in the usual sterile fashion. The correct patient, procedure, and site was verified.   Injection Procedure Details:  Procedure Site One Meds Administered:  Meds ordered this encounter  Medications  . methylPREDNISolone acetate (DEPO-MEDROL) injection 80 mg    Laterality: Left  Location/Site:  S1 Foramen   Needle size: 22 ga.  Needle type: Spinal  Needle Placement: Transforaminal  Findings:   -Comments: Excellent flow of contrast along the nerve, nerve root and into the epidural space.  Epidurogram: Contrast epidurogram showed nerve root cut off and restricted flow pattern.  Procedure Details: After squaring off the sacral end-plate to get a true AP view, the C-arm was positioned so that the best possible view of the S1 foramen was visualized. The soft tissues overlying this structure were infiltrated with 2-3 ml. of 1% Lidocaine without Epinephrine.    The spinal needle was inserted toward the target using a "trajectory" view along the fluoroscope beam.  Under AP and lateral visualization, the needle was advanced  so it did not puncture dura. Biplanar projections were used to confirm position. Aspiration was confirmed to be negative for CSF and/or blood. A 1-2 ml. volume of Isovue-250 was injected and flow of contrast was noted at each level. Radiographs were obtained for documentation purposes.   After attaining the desired flow of contrast documented above, a 0.5 to 1.0 ml  test dose of 0.25% Marcaine was injected into each respective transforaminal space.  The patient was observed for 90 seconds post injection.  After no sensory deficits were reported, and normal lower extremity motor function was noted,   the above injectate was administered so that equal amounts of the injectate were placed at each foramen (level) into the transforaminal epidural space.   Additional Comments:  The patient tolerated the procedure well Dressing: Band-Aid with 2 x 2 sterile gauze    Post-procedure details: Patient was observed during the procedure. Post-procedure instructions were reviewed.  Patient left the clinic in stable condition.     Clinical History: MRI LUMBAR SPINE WITHOUT CONTRAST  TECHNIQUE: Multiplanar, multisequence MR imaging of the lumbar spine was performed. No intravenous contrast was administered.  COMPARISON:  [facility] 11/06/2018.  FINDINGS: Segmentation:  Standard.  Alignment:  Anatomic.  Vertebrae: No worrisome osseous lesion. Endplate reactive changes L5-S1.  Conus medullaris and cauda equina: Conus extends to the L1 level. Conus and cauda equina appear normal.  Paraspinal and other soft tissues: Unremarkable.  Disc levels:  L1-L2: Disc space narrowing. Central and rightward protrusion. No compressive subarticular zone or foraminal zone narrowing.  L2-L3:  Disc desiccation.  Central protrusion.  No impingement.  L3-L4: Unremarkable disc space. Mild facet arthropathy. No impingement.  L4-L5: Good disc height and signal. Central and leftward disc extrusion. Caudally migrated free fragment on the LEFT. Facet arthropathy is superimposed. LEFT L5 neural impingement is likely. No compressive foraminal narrowing.  L5-S1: Advanced disc space narrowing. Osseous spurring. Facet arthropathy. No impingement.  IMPRESSION: Central and leftward disc extrusion with caudally migrated free fragment at L4-5 on the LEFT. LEFT L5  neural impingement is likely.  Minor lumbar spondylosis elsewhere, not clearly compressive.   Electronically Signed   By: Staci Righter M.D.   On: 11/21/2018 10:01     Objective:  VS:  HT:    WT:   BMI:     BP:128/78  HR:93bpm  TEMP: ( )  RESP:  Physical Exam Constitutional:      General: She is not in acute distress.    Appearance: Normal appearance. She is not ill-appearing.  HENT:     Head: Normocephalic and atraumatic.     Right Ear: External ear normal.     Left Ear: External ear normal.  Eyes:     Extraocular Movements: Extraocular movements intact.  Cardiovascular:     Rate and Rhythm: Normal rate.     Pulses: Normal pulses.  Musculoskeletal:        General: Tenderness present.     Right lower leg: No edema.     Left lower leg: No edema.     Comments: Patient has good distal strength with no pain over the greater trochanters.  No clonus or focal weakness.  Skin:    Findings: No erythema, lesion or rash.  Neurological:     General: No focal deficit present.     Mental Status: She is alert and oriented to person, place, and time.     Sensory: Sensory deficit present.     Motor: No weakness or abnormal muscle tone.  Coordination: Coordination normal.  Psychiatric:        Mood and Affect: Mood normal.        Behavior: Behavior normal.      Imaging: No results found.

## 2020-10-17 NOTE — Procedures (Signed)
Lumbosacral Transforaminal Epidural Steroid Injection - Sub-Pedicular Approach with Fluoroscopic Guidance  Patient: Candice Guerrero      Date of Birth: [birth year] MRN: [id number] PCP: Shelva Majestic, MD      Visit Date: 08/18/2020   Universal Protocol:    Date/Time: 08/18/2020  Consent Given By: the patient  Position: PRONE  Additional Comments: Vital signs were monitored before and after the procedure. Patient was prepped and draped in the usual sterile fashion. The correct patient, procedure, and site was verified.   Injection Procedure Details:   Procedure diagnoses: Lumbar radiculopathy [M54.16]    Meds Administered:  Meds ordered this encounter  Medications  . methylPREDNISolone acetate (DEPO-MEDROL) injection 80 mg    Laterality: Left  Location/Site:  L5-S1  Needle:5.0 in., 22 ga.  Short bevel or Quincke spinal needle  Needle Placement: Transforaminal  Findings:    -Comments: Excellent flow of contrast along the nerve, nerve root and into the epidural space.  Procedure Details: After squaring off the end-plates to get a true AP view, the C-arm was positioned so that an oblique view of the foramen as noted above was visualized. The target area is just inferior to the "nose of the scotty dog" or sub pedicular. The soft tissues overlying this structure were infiltrated with 2-3 ml. of 1% Lidocaine without Epinephrine.  The spinal needle was inserted toward the target using a "trajectory" view along the fluoroscope beam.  Under AP and lateral visualization, the needle was advanced so it did not puncture dura and was located close the 6 O'Clock position of the pedical in AP tracterory. Biplanar projections were used to confirm position. Aspiration was confirmed to be negative for CSF and/or blood. A 1-2 ml. volume of Isovue-250 was injected and flow of contrast was noted at each level. Radiographs were obtained for documentation purposes.   After attaining the desired  flow of contrast documented above, a 0.5 to 1.0 ml test dose of 0.25% Marcaine was injected into each respective transforaminal space.  The patient was observed for 90 seconds post injection.  After no sensory deficits were reported, and normal lower extremity motor function was noted,   the above injectate was administered so that equal amounts of the injectate were placed at each foramen (level) into the transforaminal epidural space.   Additional Comments:  The patient tolerated the procedure well Dressing: 2 x 2 sterile gauze and Band-Aid    Post-procedure details: Patient was observed during the procedure. Post-procedure instructions were reviewed.  Patient left the clinic in stable condition.

## 2020-10-17 NOTE — Procedures (Signed)
S1 Lumbosacral Transforaminal Epidural Steroid Injection - Sub-Pedicular Approach with Fluoroscopic Guidance   Patient: Candice Guerrero      Date of Birth: 1951-07-06 MRN: [id number] PCP: Shelva Majestic, MD      Visit Date: 09/15/2020   Universal Protocol:    Date/Time: 01/02/221:28 PM  Consent Given By: the patient  Position:  PRONE  Additional Comments: Vital signs were monitored before and after the procedure. Patient was prepped and draped in the usual sterile fashion. The correct patient, procedure, and site was verified.   Injection Procedure Details:  Procedure Site One Meds Administered:  Meds ordered this encounter  Medications  . methylPREDNISolone acetate (DEPO-MEDROL) injection 80 mg    Laterality: Left  Location/Site:  S1 Foramen   Needle size: 22 ga.  Needle type: Spinal  Needle Placement: Transforaminal  Findings:   -Comments: Excellent flow of contrast along the nerve, nerve root and into the epidural space.  Epidurogram: Contrast epidurogram showed nerve root cut off and restricted flow pattern.  Procedure Details: After squaring off the sacral end-plate to get a true AP view, the C-arm was positioned so that the best possible view of the S1 foramen was visualized. The soft tissues overlying this structure were infiltrated with 2-3 ml. of 1% Lidocaine without Epinephrine.    The spinal needle was inserted toward the target using a "trajectory" view along the fluoroscope beam.  Under AP and lateral visualization, the needle was advanced so it did not puncture dura. Biplanar projections were used to confirm position. Aspiration was confirmed to be negative for CSF and/or blood. A 1-2 ml. volume of Isovue-250 was injected and flow of contrast was noted at each level. Radiographs were obtained for documentation purposes.   After attaining the desired flow of contrast documented above, a 0.5 to 1.0 ml test dose of 0.25% Marcaine was injected into each  respective transforaminal space.  The patient was observed for 90 seconds post injection.  After no sensory deficits were reported, and normal lower extremity motor function was noted,   the above injectate was administered so that equal amounts of the injectate were placed at each foramen (level) into the transforaminal epidural space.   Additional Comments:  The patient tolerated the procedure well Dressing: Band-Aid with 2 x 2 sterile gauze    Post-procedure details: Patient was observed during the procedure. Post-procedure instructions were reviewed.  Patient left the clinic in stable condition.

## 2020-10-17 NOTE — Progress Notes (Signed)
Candice Guerrero - 70 y.o. female MRN [id number]  Date of birth: [birth year]  Office Visit Note: Visit Date: 08/18/2020 PCP: [name], MD Referred by: [name], MD  Subjective: Chief Complaint  Patient presents with  . Lower Back - Pain  . Left Leg - Pain   HPI: Candice Guerrero is a 70 y.o. female who comes in today For evaluation management of low back pain with radicular left posterior lateral thigh and foot pain.  No right-sided complaints.  Her history is well-documented in the fact that she did have disc herniation seen on MRI with report below.  [facility] care physician Dr. [name].  From an orthopedic standpoint she is also followed by Dr. Rod Can at [facility] mainly for her knees.  We completed a left L5 transforaminal epidural steroid injection in May of last year after she had a similar injection performed at [facility] in February of that year.  She had pretty good relief up until just recently.  No new injury.  Pain is 7 out of 10 despite pain medication and ice and rest.  She gets worsening with standing and ambulating and housecleaning and bending forward.  Her case is complicated by diabetes.  She does not report any red flag complaints of focal weakness etc.  Just all of a sudden increased pain without any provocation.  Pain is severe.  There is paresthesias.  Pretty classic L5 distribution.  No prior history of lumbar surgery.  Review of Systems  Musculoskeletal: Positive for back pain and joint pain.  Neurological: Positive for tingling.  All other systems reviewed and are negative.  Otherwise per HPI.  Assessment & Plan: Visit Diagnoses:    ICD-10-CM   1. Lumbar radiculopathy  M54.16 XR C-ARM NO REPORT    Epidural Steroid injection    methylPREDNISolone acetate (DEPO-MEDROL) injection 80 mg  2. Radiculopathy due to lumbar intervertebral disc disorder  M51.16   3. Chronic bilateral  low back pain with left-sided sciatica  M54.42    G89.29      Plan: Findings:  Reexacerbation of chronic low back pain with left radicular L5 radiculopathy.  She does have some sensation change or paresthesia in this region in a classic L5 distribution which does fit with MRI of disc herniation at L4-5.  MRI reviewed again with the patient today along with alternative treatments.  She will continue with home exercises has had physical therapy.  Recent onset exacerbation without any really inciting problems other than just continued normal daily activities which are getting harder to do.  We talked about natural history of disc herniations once again also we talked about microdiscectomy etc.  We will complete left L5 transforaminal injection again today.  This will be with fluoroscopic guidance.  Patient does have access to comprehensive management program including medication management and biopsychosocial counseling if needed.    Meds & Orders:  Meds ordered this encounter  Medications  . methylPREDNISolone acetate (DEPO-MEDROL) injection 80 mg    Orders Placed This Encounter  Procedures  . XR C-ARM NO REPORT  . Epidural Steroid injection    Follow-up: Return if symptoms worsen or fail to improve.   Procedures: No procedures performed  Lumbosacral Transforaminal Epidural Steroid Injection - Sub-Pedicular Approach with Fluoroscopic Guidance  Patient: Candice Guerrero      Date of Birth: 01-30-1951 MRN: [id number] PCP: [name], MD      Visit Date:  08/18/2020   Universal Protocol:    Date/Time: 08/18/2020  Consent Given By: the patient  Position: PRONE  Additional Comments: Vital signs were monitored before and after the procedure. Patient was prepped and draped in the usual sterile fashion. The correct patient, procedure, and site was verified.   Injection Procedure Details:   Procedure diagnoses: Lumbar radiculopathy [M54.16]    Meds Administered:  Meds ordered this  encounter  Medications  . methylPREDNISolone acetate (DEPO-MEDROL) injection 80 mg    Laterality: Left  Location/Site:  L5-S1  Needle:5.0 in., 22 ga.  Short bevel or Quincke spinal needle  Needle Placement: Transforaminal  Findings:    -Comments: Excellent flow of contrast along the nerve, nerve root and into the epidural space.  Procedure Details: After squaring off the end-plates to get a true AP view, the C-arm was positioned so that an oblique view of the foramen as noted above was visualized. The target area is just inferior to the "nose of the scotty dog" or sub pedicular. The soft tissues overlying this structure were infiltrated with 2-3 ml. of 1% Lidocaine without Epinephrine.  The spinal needle was inserted toward the target using a "trajectory" view along the fluoroscope beam.  Under AP and lateral visualization, the needle was advanced so it did not puncture dura and was located close the 6 O'Clock position of the pedical in AP tracterory. Biplanar projections were used to confirm position. Aspiration was confirmed to be negative for CSF and/or blood. A 1-2 ml. volume of Isovue-250 was injected and flow of contrast was noted at each level. Radiographs were obtained for documentation purposes.   After attaining the desired flow of contrast documented above, a 0.5 to 1.0 ml test dose of 0.25% Marcaine was injected into each respective transforaminal space.  The patient was observed for 90 seconds post injection.  After no sensory deficits were reported, and normal lower extremity motor function was noted,   the above injectate was administered so that equal amounts of the injectate were placed at each foramen (level) into the transforaminal epidural space.   Additional Comments:  The patient tolerated the procedure well Dressing: 2 x 2 sterile gauze and Band-Aid    Post-procedure details: Patient was observed during the procedure. Post-procedure instructions were  reviewed.  Patient left the clinic in stable condition.      Clinical History: MRI LUMBAR SPINE WITHOUT CONTRAST  TECHNIQUE: Multiplanar, multisequence MR imaging of the lumbar spine was performed. No intravenous contrast was administered.  COMPARISON:  [place] 11/06/2018.  FINDINGS: Segmentation:  Standard.  Alignment:  Anatomic.  Vertebrae: No worrisome osseous lesion. Endplate reactive changes L5-S1.  Conus medullaris and cauda equina: Conus extends to the L1 level. Conus and cauda equina appear normal.  Paraspinal and other soft tissues: Unremarkable.  Disc levels:  L1-L2: Disc space narrowing. Central and rightward protrusion. No compressive subarticular zone or foraminal zone narrowing.  L2-L3:  Disc desiccation.  Central protrusion.  No impingement.  L3-L4: Unremarkable disc space. Mild facet arthropathy. No impingement.  L4-L5: Good disc height and signal. Central and leftward disc extrusion. Caudally migrated free fragment on the LEFT. Facet arthropathy is superimposed. LEFT L5 neural impingement is likely. No compressive foraminal narrowing.  L5-S1: Advanced disc space narrowing. Osseous spurring. Facet arthropathy. No impingement.  IMPRESSION: Central and leftward disc extrusion with caudally migrated free fragment at L4-5 on the LEFT. LEFT L5 neural impingement is likely.  Minor lumbar spondylosis elsewhere, not clearly compressive.   Electronically Signed   By:  Staci Righter M.D.   On: 11/21/2018 10:01   She reports that she quit smoking about 22 years ago. Her smoking use included cigarettes. She has a 2.00 pack-year smoking history. She has never used smokeless tobacco.  Recent Labs    12/30/19 1132 04/30/20 1540 08/31/20 1347  HGBA1C 7.7* 7.3* 6.9*    Objective:  VS:  HT:    WT:   BMI:     BP:134/90  HR:97bpm  TEMP: ( )  RESP:  Physical Exam Constitutional:      General: She is not in acute distress.     Appearance: Normal appearance. She is not ill-appearing.  HENT:     Head: Normocephalic and atraumatic.     Right Ear: External ear normal.     Left Ear: External ear normal.  Eyes:     Extraocular Movements: Extraocular movements intact.  Cardiovascular:     Rate and Rhythm: Normal rate.     Pulses: Normal pulses.  Musculoskeletal:     Right lower leg: No edema.     Left lower leg: No edema.     Comments: Patient has good distal strength with no pain over the greater trochanters.  No clonus or focal weakness.  Patient has positive slump test on the left.  Skin:    Findings: No erythema, lesion or rash.  Neurological:     General: No focal deficit present.     Mental Status: She is alert and oriented to person, place, and time.     Cranial Nerves: No cranial nerve deficit.     Sensory: Sensory deficit present.     Motor: No weakness or abnormal muscle tone.     Coordination: Coordination normal.     Gait: Gait abnormal.  Psychiatric:        Mood and Affect: Mood normal.        Behavior: Behavior normal.     Ortho Exam  Imaging: No results found.  Past Medical/Family/Surgical/Social History: Medications & Allergies reviewed per EMR, new medications updated. Patient Active Problem List   Diagnosis Date Noted  . Tremor 04/30/2020  . DDD (degenerative disc disease), cervical 01/29/2020  . Hamstring tendinitis of left thigh 12/25/2019  . Acute midline low back pain with left-sided sciatica 11/06/2018  . Abnormal mammogram of right breast 07/18/2017  . Osteoporosis 07/24/2014  . IBS (irritable bowel syndrome) 07/24/2014  . Allergic rhinitis 07/24/2014  . Obesity (BMI 30-39.9) 12/31/2013  . Psoriasis 12/13/2010  . Benign hematuria 12/13/2010  . Diabetes mellitus type II, controlled, with no complications ([place]) [id number]  . Hypertension associated with diabetes ([place]) 12/13/2010  . GERD (gastroesophageal reflux disease) 12/13/2010  . Depression with anxiety 12/13/2010  .  Fatty liver 12/13/2010  . Hyperlipidemia associated with type 2 diabetes mellitus ([place]) 12/13/2010   Past Medical History:  Diagnosis Date  . Abnormal mammogram of right breast 07/18/2017  . Allergy    SEASONAL  . Benign hematuria   . Cataract    LEFT EYE  . Dental crowns present   . Depression   . Diabetes mellitus    NIDDM  . GERD (gastroesophageal reflux disease)   . Headache(784.0)    migraines, sinus  . Hiatal hernia   . Hyperlipidemia   . Hypertension    under control, has been on med. since 2005  . Osteopenia   . Psoriasis    arms, legs, back  . Snores    denies sx. of sleep apnea   Family History  Problem  Relation Age of Onset  . Hyperlipidemia Mother   . Hypertension Mother   . Tremor Mother   . Hypertension Father   . Kidney disease Father   . Renal Disease Father   . Anesthesia problems Sister        post-op N/V, hard to wake up post-op  . Diabetes Maternal Uncle   . High blood pressure Brother   . Prostate cancer Brother   . Healthy Son   . Healthy Son    Past Surgical History:  Procedure Laterality Date  . BREAST LUMPECTOMY WITH RADIOACTIVE SEED LOCALIZATION Right 07/18/2017   Procedure: RIGHT BREAST LUMPECTOMY WITH RADIOACTIVE SEED LOCALIZATION;  Surgeon: Fanny Skates, MD;  Location: [facility];  Service: General;  Laterality: Right;  . COLONOSCOPY    . DILATION AND CURETTAGE OF UTERUS    . HARDWARE REMOVAL  07/11/2012   Procedure: HARDWARE REMOVAL;  Surgeon: Cammie Sickle., MD;  Location: [facility];  Service: Orthopedics;  Laterality: Left;  pin removal wire out and debride pisiform  . left wrist surgery     July 9th 2013  . TONSILLECTOMY    . TOOTH EXTRACTION Right 12/13/2016   Procedure: SURGICAL REMOVAL OF ODONTOGENIC TUMOR AND REMOVAL OF IMPACTED TOOTH #1;  Surgeon: Jannette Fogo, DDS;  Location: [facility];  Service: Oral Surgery;  Laterality: Right;  . TRIGGER FINGER RELEASE  07/11/2012    Procedure: RELEASE TRIGGER FINGER/A-1 PULLEY;  Surgeon: Cammie Sickle., MD;  Location: [facility];  Service: Orthopedics;  Laterality: Left;  LEFT LONG FINGER   Social History   Occupational History  . Occupation: Retired  Tobacco Use  . Smoking status: Former Smoker    Packs/day: 0.10    Years: 20.00    Pack years: 2.00    Types: Cigarettes    Quit date: 10/16/1998    Years since quitting: 22.0  . Smokeless tobacco: Never Used  . Tobacco comment: quit smoking in 2000  Vaping Use  . Vaping Use: Never used  Substance and Sexual Activity  . Alcohol use: Yes    Comment: socially, weekends  . Drug use: No  . Sexual activity: Yes

## 2020-10-22 ENCOUNTER — Ambulatory Visit: Payer: Medicare HMO

## 2020-11-27 ENCOUNTER — Other Ambulatory Visit: Payer: Self-pay | Admitting: Family Medicine

## 2020-12-29 NOTE — Patient Instructions (Incomplete)
Health Maintenance Due  Topic Date Due  . COVID-19 Vaccine (3 - Booster for Pfizer series) 05/27/2020  . FOOT EXAM  09/01/2020   Depression screen [facility] 2/9 08/31/2020 04/30/2020 04/30/2020  Decreased Interest 0 0 0  Down, Depressed, Hopeless 0 0 0  PHQ - 2 Score 0 0 0  Altered sleeping 0 1 -  Tired, decreased energy 0 0 -  Change in appetite - 0 -  Feeling bad or failure about yourself  0 0 -  Trouble concentrating 0 0 -  Moving slowly or fidgety/restless 0 0 -  Suicidal thoughts 0 0 -  PHQ-9 Score 0 1 -  Difficult doing work/chores - Not difficult at all -  Some recent data might be hidden

## 2020-12-29 NOTE — Progress Notes (Deleted)
Phone [phone] In person visit   Subjective:   Candice Guerrero is a 70 y.o. year old very pleasant female patient who presents for/with See problem oriented charting No chief complaint on file.   This visit occurred during the SARS-CoV-2 public health emergency.  Safety protocols were in place, including screening questions prior to the visit, additional usage of staff PPE, and extensive cleaning of exam room while observing appropriate contact time as indicated for disinfecting solutions.   Past Medical History-  Patient Active Problem List   Diagnosis Date Noted  . Tremor 04/30/2020  . DDD (degenerative disc disease), cervical 01/29/2020  . Hamstring tendinitis of left thigh 12/25/2019  . Acute midline low back pain with left-sided sciatica 11/06/2018  . Abnormal mammogram of right breast 07/18/2017  . Osteoporosis 07/24/2014  . IBS (irritable bowel syndrome) 07/24/2014  . Allergic rhinitis 07/24/2014  . Obesity (BMI 30-39.9) 12/31/2013  . Psoriasis 12/13/2010  . Benign hematuria 12/13/2010  . Diabetes mellitus type II, controlled, with no complications ([place]) 82/80/0349  . Hypertension associated with diabetes ([place]) 12/13/2010  . GERD (gastroesophageal reflux disease) 12/13/2010  . Depression with anxiety 12/13/2010  . Fatty liver 12/13/2010  . Hyperlipidemia associated with type 2 diabetes mellitus ([place]) 12/13/2010    Medications- reviewed and updated Current Outpatient Medications  Medication Sig Dispense Refill  . aspirin-acetaminophen-caffeine (EXCEDRIN MIGRAINE) 250-250-65 MG per tablet Take 1 tablet by mouth every 6 (six) hours as needed. Reported on 10/21/2015 (Patient not taking: Reported on 09/15/2020)    . atorvastatin (LIPITOR) 40 MG tablet Take 40 mg by mouth daily.    . calcipotriene-betamethasone (TACLONEX) ointment Apply topically daily. 60 g 0  . Calcium Carbonate (CALTRATE 600) 1500 MG TABS Take by mouth daily.      . citalopram (CELEXA) 20 MG tablet Take 1  tablet (20 mg total) by mouth daily. 90 tablet 3  . metformin (FORTAMET) 500 MG (OSM) 24 hr tablet Take 2 tablets (1,000 mg total) by mouth 2 (two) times daily with a meal. 360 tablet 3  . omeprazole (PRILOSEC) 20 MG capsule Take 1 capsule (20 mg total) by mouth daily. 90 capsule 3  . triamcinolone cream (KENALOG) 0.1 % APPLY TO AFFECTED AREA TWICE A DAY    . valsartan-hydrochlorothiazide (DIOVAN-HCT) 80-12.5 MG tablet TAKE 1 TABLET DAILY 90 tablet 3  . Vitamin D, Ergocalciferol, (DRISDOL) 1.25 MG (50000 UNIT) CAPS capsule TAKE 1 CAPSULE ONCE EVERY 7 DAYS 8 capsule 0   No current facility-administered medications for this visit.     Objective:  LMP  (LMP Unknown)  Gen: NAD, resting comfortably CV: RRR no murmurs rubs or gallops Lungs: CTAB no crackles, wheeze, rhonchi Abdomen: soft/nontender/nondistended/normal bowel sounds. No rebound or guarding.  Ext: no edema Skin: warm, dry Neuro: grossly normal, moves all extremities  ***    Assessment and Plan   #hypertension S: medication: Diovan 80-12.5Mg  Home readings #s: *** BP Readings from Last 3 Encounters:  09/15/20 128/78  08/31/20 132/80  08/18/20 134/90  A/P: ***  # GERD S:Medication: Prilosec 20Mg  B12 levels related to PPI use: Lab Results  Component Value Date   VITAMINB12 441 08/31/2020   A/P: ***   #hyperlipidemia S: Medication: Atorvastatin 40Mg  Lab Results  Component Value Date   CHOL 118 04/30/2020   HDL 43 (L) 04/30/2020   LDLCALC 48 04/30/2020   LDLDIRECT 54 08/31/2020   TRIG 195 (H) 04/30/2020   CHOLHDL 2.7 04/30/2020   A/P: ***  # Diabetes S: Medication: Metformin 500Mg , CBGs- ***  Exercise and diet- *** Lab Results  Component Value Date   HGBA1C 6.9 (H) 08/31/2020   HGBA1C 7.3 (H) 04/30/2020   HGBA1C 7.7 (H) 12/30/2019    A/P: ***   04/30/20 awv *** 04/28/2019 cpe ***  No problem-specific Assessment & Plan notes found for this encounter.   Recommended follow up: ***No follow-ups on  file. Future Appointments  Date Time Provider [place]  01/03/2021  1:40 PM [name], MD [facility]  05/06/2021 10:15 AM [facility]    Lab/Order associations: No diagnosis found.  No orders of the defined types were placed in this encounter.   Time Spent: *** minutes of total time (7:49 PM***- 7:49 PM***) was spent on the date of the encounter performing the following actions: chart review prior to seeing the patient, obtaining history, performing a medically necessary exam, counseling on the treatment plan, placing orders, and documenting in our EHR.   Return precautions advised.  Clyde Lundborg, CMA

## 2021-01-03 ENCOUNTER — Ambulatory Visit: Payer: Medicare HMO | Admitting: Family Medicine

## 2021-01-03 DIAGNOSIS — E1169 Type 2 diabetes mellitus with other specified complication: Secondary | ICD-10-CM

## 2021-01-03 DIAGNOSIS — K219 Gastro-esophageal reflux disease without esophagitis: Secondary | ICD-10-CM

## 2021-01-03 DIAGNOSIS — E1159 Type 2 diabetes mellitus with other circulatory complications: Secondary | ICD-10-CM

## 2021-01-03 DIAGNOSIS — E119 Type 2 diabetes mellitus without complications: Secondary | ICD-10-CM

## 2021-01-06 ENCOUNTER — Other Ambulatory Visit: Payer: Self-pay | Admitting: Family Medicine

## 2021-01-12 ENCOUNTER — Ambulatory Visit: Payer: Medicare HMO | Admitting: Family Medicine
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
## 2021-01-13 NOTE — Patient Instructions (Addendum)
Please stop by lab before you go If you have mychart- we will send your results within 3 business days of [place] receiving them.  If you do not have mychart- we will call you about results within 5 business days of [place] receiving them.  *please also note that you will see labs on mychart as soon as they post. I will later go in and write notes on them- will say "notes from Dr. Yong Channel"  No changes today unless labs lead [place] to make changes  Recommended follow up: Return in about 4 months (around 05/16/2021) for follow up- or sooner if needed.

## 2021-01-14 ENCOUNTER — Other Ambulatory Visit: Payer: Self-pay

## 2021-01-14 ENCOUNTER — Encounter: Payer: Self-pay | Admitting: Family Medicine

## 2021-01-14 ENCOUNTER — Ambulatory Visit (INDEPENDENT_AMBULATORY_CARE_PROVIDER_SITE_OTHER): Payer: Medicare HMO | Admitting: Family Medicine

## 2021-01-14 VITALS — BP 130/88 | HR 76 | Temp 97.6°F | Ht 64.0 in | Wt 149.4 lb

## 2021-01-14 DIAGNOSIS — K219 Gastro-esophageal reflux disease without esophagitis: Secondary | ICD-10-CM | POA: Diagnosis not present

## 2021-01-14 DIAGNOSIS — E1159 Type 2 diabetes mellitus with other circulatory complications: Secondary | ICD-10-CM | POA: Diagnosis not present

## 2021-01-14 DIAGNOSIS — R69 Illness, unspecified: Secondary | ICD-10-CM | POA: Diagnosis not present

## 2021-01-14 DIAGNOSIS — E1169 Type 2 diabetes mellitus with other specified complication: Secondary | ICD-10-CM

## 2021-01-14 DIAGNOSIS — E559 Vitamin D deficiency, unspecified: Secondary | ICD-10-CM | POA: Diagnosis not present

## 2021-01-14 DIAGNOSIS — I152 Hypertension secondary to endocrine disorders: Secondary | ICD-10-CM

## 2021-01-14 DIAGNOSIS — E785 Hyperlipidemia, unspecified: Secondary | ICD-10-CM | POA: Diagnosis not present

## 2021-01-14 DIAGNOSIS — E119 Type 2 diabetes mellitus without complications: Secondary | ICD-10-CM

## 2021-01-14 DIAGNOSIS — F418 Other specified anxiety disorders: Secondary | ICD-10-CM

## 2021-01-14 LAB — CBC WITH DIFFERENTIAL/PLATELET
Basophils Absolute: 0 10*3/uL (ref 0.0–0.1)
Basophils Relative: 0.5 % (ref 0.0–3.0)
Eosinophils Absolute: 0.1 10*3/uL (ref 0.0–0.7)
Eosinophils Relative: 1.1 % (ref 0.0–5.0)
HCT: 41.1 % (ref 36.0–46.0)
Hemoglobin: 13.4 g/dL (ref 12.0–15.0)
Lymphocytes Relative: 30.7 % (ref 12.0–46.0)
Lymphs Abs: 2.5 10*3/uL (ref 0.7–4.0)
MCHC: 32.6 g/dL (ref 30.0–36.0)
MCV: 86 fl (ref 78.0–100.0)
Monocytes Absolute: 0.7 10*3/uL (ref 0.1–1.0)
Monocytes Relative: 8.5 % (ref 3.0–12.0)
Neutro Abs: 4.8 10*3/uL (ref 1.4–7.7)
Neutrophils Relative %: 59.2 % (ref 43.0–77.0)
Platelets: 286 10*3/uL (ref 150.0–400.0)
RBC: 4.78 Mil/uL (ref 3.87–5.11)
RDW: 13.9 % (ref 11.5–15.5)
WBC: 8.2 10*3/uL (ref 4.0–10.5)

## 2021-01-14 LAB — COMPREHENSIVE METABOLIC PANEL
ALT: 17 U/L (ref 0–35)
AST: 20 U/L (ref 0–37)
Albumin: 4.3 g/dL (ref 3.5–5.2)
Alkaline Phosphatase: 65 U/L (ref 39–117)
BUN: 17 mg/dL (ref 6–23)
CO2: 32 mEq/L (ref 19–32)
Calcium: 10 mg/dL (ref 8.4–10.5)
Chloride: 99 mEq/L (ref 96–112)
Creatinine, Ser: 0.75 mg/dL (ref 0.40–1.20)
GFR: 79.83 mL/min (ref >60.00)
Glucose, Bld: 108 mg/dL — ABNORMAL HIGH (ref 70–99)
Potassium: 4.6 mEq/L (ref 3.5–5.1)
Sodium: 141 mEq/L (ref 135–145)
Total Bilirubin: 0.4 mg/dL (ref 0.2–1.2)
Total Protein: 6.7 g/dL (ref 6.0–8.3)

## 2021-01-14 LAB — VITAMIN D 25 HYDROXY (VIT D DEFICIENCY, FRACTURES): VITD: 64.75 ng/mL (ref 30.00–100.00)

## 2021-01-14 LAB — HEMOGLOBIN A1C: Hgb A1c MFr Bld: 6.9 % — ABNORMAL HIGH (ref 4.6–6.5)

## 2021-01-14 MED ORDER — SILVER SULFADIAZINE 1 % EX CREA: 1.0000 "application " | TOPICAL_CREAM | Freq: Every day | CUTANEOUS | 0 refills | Status: AC

## 2021-01-27 ENCOUNTER — Other Ambulatory Visit: Payer: Self-pay

## 2021-01-27 ENCOUNTER — Telehealth: Payer: Self-pay

## 2021-01-27 MED ORDER — ROSUVASTATIN CALCIUM 40 MG PO TABS: 40.0000 mg | ORAL_TABLET | Freq: Every day | ORAL | 3 refills | Status: DC

## 2021-01-27 NOTE — Telephone Encounter (Signed)
-----   Message from [name], MD sent at 01/27/2021  9:56 AM EDT ----- Regarding: RE: SUPD CMS Measure If you just reply all they will be included- I added them . Team please send in  ----- Message ----- From: Rowland Lathe, [facility] Sent: 01/26/2021   4:44 PM EDT To: [name], MD, [name] Rx Refill Subject: RE: SUPD CMS Measure                           Hi,  I appreciate your help! Please let me know if I need to fix how this message should be forwarded to the team. I apologize in advance if done incorrectly!   Thanks,   Kristeen Miss, PharmD ----- Message ----- From: [name], MD Sent: 01/26/2021   4:12 PM EDT To: Rowland Lathe, RPH, [facility] [employer] Subject: RE: SUPD CMS Measure                           No patient attached (a common thing that happens to me when I send staff messags). Please send this back with patient attached to [facility] team and they can refill rosuvastatin for patient #90 with 3 refills  [name]  ----- Message ----- From: Rowland Lathe, [facility] Sent: 01/26/2021   2:38 PM EDT To: [name], MD Subject: SUPD CMS Measure                               Hi Dr. Yong Channel,   I am a [facility] clinical pharmacist that reviews patients for statin quality initiatives.     Per review of chart and payor information, patient has a diagnosis of diabetes and has been on rosuvastatin 40 mg QD - last filled 10/09/2020 for 90 DS with no refills. This places patient into the SUPD (Statin Use in Patients with Diabetes)  measure for CMS. The patient was recently seen on 01/14/2021 and there was no renewal for rosuvastatin. 05/05/2020 lipid panel- TC 118; HDL 43: TG 195: LDL 48; LFT on 01/14/21 WNL. Next appointment with you is on 05/23/2021.  Please consider renewing rosuvastatin if deemed clinically appropriate.   Thank you for allowing pharmacy to be a part of this patient's care.  Kristeen Miss, PharmD Clinical Pharmacist [place] Cell: [phone]

## 2021-01-27 NOTE — Telephone Encounter (Signed)
Medication sent in. 

## 2021-02-07 DIAGNOSIS — M8580 Other specified disorders of bone density and structure, unspecified site: Secondary | ICD-10-CM | POA: Diagnosis not present

## 2021-02-07 DIAGNOSIS — Z01419 Encounter for gynecological examination (general) (routine) without abnormal findings: Secondary | ICD-10-CM | POA: Diagnosis not present

## 2021-02-09 ENCOUNTER — Other Ambulatory Visit: Payer: Self-pay | Admitting: Obstetrics and Gynecology

## 2021-02-09 DIAGNOSIS — M8580 Other specified disorders of bone density and structure, unspecified site: Secondary | ICD-10-CM

## 2021-02-22 ENCOUNTER — Other Ambulatory Visit: Payer: Self-pay | Admitting: Family Medicine

## 2021-02-22 DIAGNOSIS — Z1231 Encounter for screening mammogram for malignant neoplasm of breast: Secondary | ICD-10-CM

## 2021-02-23 ENCOUNTER — Ambulatory Visit
Admission: RE | Admit: 2021-02-23 | Discharge: 2021-02-23 | Disposition: A | Discharge status: Home / Self Care | Payer: Medicare HMO | Source: Ambulatory Visit | Attending: Obstetrics and Gynecology | Admitting: Obstetrics and Gynecology

## 2021-02-23 ENCOUNTER — Other Ambulatory Visit: Payer: Self-pay

## 2021-02-23 DIAGNOSIS — M8580 Other specified disorders of bone density and structure, unspecified site: Secondary | ICD-10-CM

## 2021-02-23 DIAGNOSIS — Z780 Asymptomatic menopausal state: Secondary | ICD-10-CM | POA: Diagnosis not present

## 2021-02-23 DIAGNOSIS — M8589 Other specified disorders of bone density and structure, multiple sites: Secondary | ICD-10-CM | POA: Diagnosis not present

## 2021-03-01 DIAGNOSIS — D485 Neoplasm of uncertain behavior of skin: Secondary | ICD-10-CM | POA: Diagnosis not present

## 2021-03-01 DIAGNOSIS — L814 Other melanin hyperpigmentation: Secondary | ICD-10-CM | POA: Diagnosis not present

## 2021-03-01 DIAGNOSIS — D225 Melanocytic nevi of trunk: Secondary | ICD-10-CM | POA: Diagnosis not present

## 2021-03-01 DIAGNOSIS — Z86006 Personal history of melanoma in-situ: Secondary | ICD-10-CM | POA: Diagnosis not present

## 2021-03-01 DIAGNOSIS — L400 Psoriasis vulgaris: Secondary | ICD-10-CM | POA: Diagnosis not present

## 2021-03-01 DIAGNOSIS — D2261 Melanocytic nevi of right upper limb, including shoulder: Secondary | ICD-10-CM | POA: Diagnosis not present

## 2021-03-01 DIAGNOSIS — L821 Other seborrheic keratosis: Secondary | ICD-10-CM | POA: Diagnosis not present

## 2021-03-01 DIAGNOSIS — L578 Other skin changes due to chronic exposure to nonionizing radiation: Secondary | ICD-10-CM | POA: Diagnosis not present

## 2021-03-01 DIAGNOSIS — D2271 Melanocytic nevi of right lower limb, including hip: Secondary | ICD-10-CM | POA: Diagnosis not present

## 2021-03-01 DIAGNOSIS — D2272 Melanocytic nevi of left lower limb, including hip: Secondary | ICD-10-CM | POA: Diagnosis not present

## 2021-04-06 DIAGNOSIS — H5213 Myopia, bilateral: Secondary | ICD-10-CM | POA: Diagnosis not present

## 2021-04-06 DIAGNOSIS — E119 Type 2 diabetes mellitus without complications: Secondary | ICD-10-CM | POA: Diagnosis not present

## 2021-04-06 LAB — HM DIABETES EYE EXAM

## 2021-04-07 ENCOUNTER — Encounter: Payer: Self-pay | Admitting: Family Medicine

## 2021-04-23 ENCOUNTER — Other Ambulatory Visit: Payer: Self-pay | Admitting: Family Medicine

## 2021-04-24 ENCOUNTER — Other Ambulatory Visit: Payer: Self-pay | Admitting: Family Medicine

## 2021-05-06 ENCOUNTER — Ambulatory Visit: Payer: Medicare HMO

## 2021-05-12 ENCOUNTER — Telehealth: Payer: Self-pay

## 2021-05-12 ENCOUNTER — Encounter: Payer: Self-pay | Admitting: Family Medicine

## 2021-05-12 NOTE — Telephone Encounter (Signed)
Can you please schedule patient and her husband with someone

## 2021-05-12 NOTE — Telephone Encounter (Signed)
Patient and husband tested positive for COVID earlier this week. Wanting to let Dr. Yong Channel know and to see if any medications can be sent to the pharmacy.

## 2021-05-12 NOTE — Telephone Encounter (Signed)
Pt and husband would need a virtual visit in order to have meds sent in.

## 2021-05-13 ENCOUNTER — Other Ambulatory Visit: Payer: Self-pay

## 2021-05-13 ENCOUNTER — Telehealth (INDEPENDENT_AMBULATORY_CARE_PROVIDER_SITE_OTHER): Payer: Medicare HMO | Admitting: Family Medicine

## 2021-05-13 ENCOUNTER — Encounter: Payer: Self-pay | Admitting: Family Medicine

## 2021-05-13 ENCOUNTER — Other Ambulatory Visit: Payer: Self-pay | Admitting: Family Medicine

## 2021-05-13 VITALS — Temp 97.0°F | Ht 64.0 in | Wt 165.0 lb

## 2021-05-13 DIAGNOSIS — E1169 Type 2 diabetes mellitus with other specified complication: Secondary | ICD-10-CM

## 2021-05-13 DIAGNOSIS — U071 COVID-19: Secondary | ICD-10-CM | POA: Diagnosis not present

## 2021-05-13 DIAGNOSIS — E119 Type 2 diabetes mellitus without complications: Secondary | ICD-10-CM

## 2021-05-13 DIAGNOSIS — E785 Hyperlipidemia, unspecified: Secondary | ICD-10-CM

## 2021-05-13 MED ORDER — ATORVASTATIN CALCIUM 40 MG PO TABS: 40.0000 mg | ORAL_TABLET | Freq: Every day | ORAL | 3 refills | Status: DC

## 2021-05-13 MED ORDER — METFORMIN HCL ER 500 MG PO TB24: 1000.0000 mg | ORAL_TABLET | Freq: Two times a day (BID) | ORAL | 3 refills | Status: DC

## 2021-05-13 MED ORDER — NIRMATRELVIR/RITONAVIR (PAXLOVID)TABLET: 3.0000 | ORAL_TABLET | Freq: Two times a day (BID) | ORAL | 0 refills | Status: AC

## 2021-05-13 NOTE — Telephone Encounter (Signed)
Patient and husband has been scheduled for virtual visits.
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
## 2021-05-17 ENCOUNTER — Encounter: Payer: Self-pay | Admitting: Family Medicine

## 2021-05-18 NOTE — Telephone Encounter (Signed)
Patient is scheduled   

## 2021-05-23 ENCOUNTER — Ambulatory Visit: Payer: Medicare HMO | Admitting: Family Medicine

## 2021-05-23 ENCOUNTER — Encounter: Payer: Self-pay | Admitting: Family Medicine

## 2021-05-23 MED ORDER — BENZONATATE 100 MG PO CAPS: 100.0000 mg | ORAL_CAPSULE | Freq: Two times a day (BID) | ORAL | 0 refills | Status: DC | PRN

## 2021-06-01 ENCOUNTER — Encounter: Payer: Self-pay | Admitting: Family Medicine

## 2021-06-02 ENCOUNTER — Encounter: Payer: Self-pay | Admitting: Family Medicine

## 2021-06-06 ENCOUNTER — Ambulatory Visit (INDEPENDENT_AMBULATORY_CARE_PROVIDER_SITE_OTHER): Payer: Medicare HMO

## 2021-06-06 DIAGNOSIS — Z0000 Encounter for general adult medical examination without abnormal findings: Secondary | ICD-10-CM | POA: Diagnosis not present

## 2021-06-06 NOTE — Progress Notes (Signed)
Virtual Visit via Telephone Note  I connected with  Blaike Tell on 06/06/21 at  1:45 PM EDT by telephone and verified that I am speaking with the correct person using two identifiers.  Medicare Annual Wellness visit completed telephonically due to Covid-19 pandemic.   Persons participating in this call: This Health Coach and this patient.  Location: Patient: Home Provider: office   I discussed the limitations, risks, security and privacy concerns of performing an evaluation and management service by telephone and the availability of in person appointments. The patient expressed understanding and agreed to proceed.  Unable to perform video visit due to video visit attempted and failed and/or patient does not have video capability.   Some vital signs may be absent or patient reported.   Willette Brace, LPN   Subjective:   Talon Semelsberger is a 70 y.o. female who presents for Medicare Annual (Subsequent) preventive examination.  Review of Systems     Cardiac Risk Factors include: advanced age (>6mn, >>3women);hypertension;dyslipidemia     Objective:    There were no vitals filed for this visit. There is no height or weight on file to calculate BMI.  Advanced Directives 06/06/2021 05/18/2020 04/30/2020 07/18/2017 07/12/2017 12/13/2016 12/08/2016  Does Patient Have a Medical Advance Directive? No No No No No No No  Does patient want to make changes to medical advance directive? - - Yes (MAU/Ambulatory/Procedural Areas - Information given) - - - -  Would patient like information on creating a medical advance directive? Yes (MAU/Ambulatory/Procedural Areas - Information given) - - - - No - Patient declined -    Current Medications (verified) Outpatient Encounter Medications as of 06/06/2021  Medication Sig   atorvastatin (LIPITOR) 40 MG tablet Take 1 tablet (40 mg total) by mouth daily.   calcipotriene-betamethasone (TACLONEX) ointment Apply topically daily.   calcium carbonate (OSCAL)  1500 (600 Ca) MG TABS tablet Take by mouth daily.   citalopram (CELEXA) 20 MG tablet TAKE 1 TABLET BY MOUTH EVERY DAY   metFORMIN (GLUCOPHAGE-XR) 500 MG 24 hr tablet Take 2 tablets (1,000 mg total) by mouth in the morning and at bedtime.   omeprazole (PRILOSEC) 20 MG capsule Take 1 capsule (20 mg total) by mouth daily.   silver sulfADIAZINE (SILVADENE) 1 % cream Apply 1 application topically daily.   triamcinolone cream (KENALOG) 0.1 % APPLY TO AFFECTED AREA TWICE A DAY   valsartan-hydrochlorothiazide (DIOVAN-HCT) 80-12.5 MG tablet TAKE 1 TABLET DAILY   [DISCONTINUED] benzonatate (TESSALON) 100 MG capsule Take 1 capsule (100 mg total) by mouth 2 (two) times daily as needed for cough. (Patient not taking: Reported on 06/06/2021)   No facility-administered encounter medications on file as of 06/06/2021.    Allergies (verified) Oxycodone and Altace [ramipril]   History: Past Medical History:  Diagnosis Date   Abnormal mammogram of right breast 07/18/2017   Allergy    SEASONAL   Benign hematuria    Cataract    LEFT EYE   Dental crowns present    Depression    Diabetes mellitus    NIDDM   GERD (gastroesophageal reflux disease)    Headache(784.0)    migraines, sinus   Hiatal hernia    Hyperlipidemia    Hypertension    under control, has been on med. since 2005   Osteopenia    Psoriasis    arms, legs, back   Snores    denies sx. of sleep apnea   Past Surgical History:  Procedure Laterality Date   BREAST LUMPECTOMY  WITH RADIOACTIVE SEED LOCALIZATION Right 07/18/2017   Procedure: RIGHT BREAST LUMPECTOMY WITH RADIOACTIVE SEED LOCALIZATION;  Surgeon: Fanny Skates, MD;  Location: [place];  Service: General;  Laterality: Right;   COLONOSCOPY     DILATION AND CURETTAGE OF UTERUS     HARDWARE REMOVAL  07/11/2012   Procedure: HARDWARE REMOVAL;  Surgeon: Cammie Sickle., MD;  Location: [place];  Service: Orthopedics;  Laterality: Left;  pin removal  wire out and debride pisiform   left wrist surgery     July 9th 2013   TONSILLECTOMY     TOOTH EXTRACTION Right 12/13/2016   Procedure: SURGICAL REMOVAL OF ODONTOGENIC TUMOR AND REMOVAL OF IMPACTED TOOTH #1;  Surgeon: Jannette Fogo, DDS;  Location: [place];  Service: Oral Surgery;  Laterality: Right;   TRIGGER FINGER RELEASE  07/11/2012   Procedure: RELEASE TRIGGER FINGER/A-1 PULLEY;  Surgeon: Cammie Sickle., MD;  Location: [place];  Service: Orthopedics;  Laterality: Left;  LEFT LONG FINGER   Family History  Problem Relation Age of Onset   Hyperlipidemia Mother    Hypertension Mother    Tremor Mother    Hypertension Father    Kidney disease Father    Renal Disease Father    Anesthesia problems Sister        post-op N/V, hard to wake up post-op   Diabetes Maternal Uncle    High blood pressure Brother    Prostate cancer Brother    Healthy Son    Healthy Son    Social History   Socioeconomic History   Marital status: Married    Spouse name: Not on file   Number of children: 2   Years of education: Not on file   Highest education level: Not on file  Occupational History   Occupation: Retired  Tobacco Use   Smoking status: Former    Packs/day: 0.10    Years: 20.00    Pack years: 2.00    Types: Cigarettes    Quit date: 10/16/1998    Years since quitting: 22.6   Smokeless tobacco: Never   Tobacco comments:    quit smoking in 2000  Vaping Use   Vaping Use: Never used  Substance and Sexual Activity   Alcohol use: Yes    Comment: socially, weekends   Drug use: No   Sexual activity: Yes  Other Topics Concern   Not on file  Social History Narrative   Moved to [place] in 2011 from [place] due to more affordable.       Married 1972 Mortimer Fries). 2 kids. 5 grandkids. 2 in [place], 3 [place].       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       Hobbies: babysitting, casino      Left handed   Social  Determinants of Health   Financial Resource Strain: Low Risk    Difficulty of Paying Living Expenses: Not hard at all  Food Insecurity: No Food Insecurity   Worried About Charity fundraiser in the Last Year: Never true   Arboriculturist in the Last Year: Never true  Transportation Needs: No Transportation Needs   Lack of Transportation (Medical): No   Lack of Transportation (Non-Medical): No  Physical Activity: Inactive   Days of Exercise per Week: 0 days   Minutes of Exercise per Session: 0 min  Stress: No Stress Concern Present   Feeling of  Stress : Only a little  Social Connections: Moderately Integrated   Frequency of Communication with Friends and Family: More than three times a week   Frequency of Social Gatherings with Friends and Family: Twice a week   Attends Religious Services: More than 4 times per year   Active Member of [employer] or Organizations: No   Attends Music therapist: Never   Marital Status: Married    Tobacco Counseling Counseling given: Not Answered Tobacco comments: quit smoking in 2000   Clinical Intake:  Pre-visit preparation completed: Yes  Pain : No/denies pain     BMI - recorded: 28.32 Nutritional Status: BMI 25 -29 Overweight Nutritional Risks: None Diabetes: Yes CBG done?: No Did pt. bring in CBG monitor from home?: No  How often do you need to have someone help you when you read instructions, pamphlets, or other written materials from your doctor or pharmacy?: 1 - Never  Diabetic?Nutrition Risk Assessment:  Has the patient had any N/V/D within the last 2 months?  No  Does the patient have any non-healing wounds?  No  Has the patient had any unintentional weight loss or weight gain?  No   Diabetes:  Is the patient diabetic?  Yes  If diabetic, was a CBG obtained today?  No  Did the patient bring in their glucometer from home?  No  How often do you monitor your CBG's? N/A .   Financial Strains and Diabetes  Management:  Are you having any financial strains with the device, your supplies or your medication? No .  Does the patient want to be seen by Chronic Care Management for management of their diabetes?  No  Would the patient like to be referred to a Nutritionist or for Diabetic Management?  No   Diabetic Exams:  Diabetic Eye Exam: Completed 04/06/21 Diabetic Foot Exam: Completed 01/14/21    Interpreter Needed?: No  Information entered by :: Charlott Rakes, LPN   Activities of Daily Living In your present state of health, do you have any difficulty performing the following activities: 06/06/2021  Hearing? N  Vision? N  Difficulty concentrating or making decisions? N  Walking or climbing stairs? N  Dressing or bathing? N  Doing errands, shopping? N  Preparing Food and eating ? N  Using the Toilet? N  In the past six months, have you accidently leaked urine? N  Do you have problems with loss of bowel control? N  Managing your Medications? N  Managing your Finances? N  Housekeeping or managing your Housekeeping? N  Some recent data might be hidden    Patient Care Team: [name], MD as PCP - General (Family Medicine) Christophe Louis, MD as PCP - OBGYN (Obstetrics and Gynecology) Opthamology, [facility] as Consulting Physician (Ophthalmology) Madelin Rear, [facility] as Pharmacist (Pharmacist)  Indicate any recent Medical Services you may have received from other than [facility] providers in the past year (date may be approximate).     Assessment:   This is a routine wellness examination for Jacob.  Hearing/Vision screen Hearing Screening - Comments:: Pt  denies any hearing issues  Vision Screening - Comments:: Pt follow up annually With [facility]  Dr Delman Cheadle for exams   Dietary issues and exercise activities discussed: Current Exercise Habits: The patient does not participate in regular exercise at present   Goals Addressed             This Visit's Progress     Patient Stated  Working on keeping A1c Down        Depression Screen PHQ 2/9 Scores 06/06/2021 01/14/2021 08/31/2020 04/30/2020 04/30/2020 12/30/2019 12/30/2019  PHQ - 2 Score 0 0 0 0 0 6 6  PHQ- 9 Score - 1 0 1 - 12 6    Fall Risk Fall Risk  06/06/2021 01/14/2021 05/18/2020 04/30/2020 09/02/2019  Falls in the past year? 0 0 0 0 0  Number falls in past yr: 0 0 0 0 0  Injury with Fall? 0 0 0 0 0  Risk for fall due to : Impaired vision;Impaired balance/gait;Impaired mobility - - Impaired vision;Orthopedic patient -  Follow up Falls prevention discussed - - - -    FALL RISK PREVENTION PERTAINING TO THE HOME:  Any stairs in or around the home? Yes  If so, are there any without handrails? No  Home free of loose throw rugs in walkways, pet beds, electrical cords, etc? Yes  Adequate lighting in your home to reduce risk of falls? Yes   ASSISTIVE DEVICES UTILIZED TO PREVENT FALLS:  Life alert? No  Use of a cane, walker or w/c? No  Grab bars in the bathroom? Yes  Shower chair or bench in shower? No  Elevated toilet seat or a handicapped toilet? No   TIMED UP AND GO:  Was the test performed? No .   Cognitive Function:     6CIT Screen 06/06/2021 04/30/2020  What Year? 0 points 0 points  What month? 0 points 0 points  What time? 0 points 0 points  Count back from 20 0 points 0 points  Months in reverse 0 points 0 points  Repeat phrase 0 points 0 points  Total Score 0 0    Immunizations Immunization History  Administered Date(s) Administered   Fluad Quad(high Dose 65+) 08/17/2020   Influenza Split 08/09/2011, 08/13/2012   Influenza, High Dose Seasonal PF 08/17/2016, 07/30/2017, 07/23/2018, 07/23/2019   Influenza,inj,Quad PF,6+ Mos 06/30/2013, 07/24/2014, 08/09/2015   Influenza-Unspecified 07/23/2019   PFIZER(Purple Top)SARS-COV-2 Vaccination 11/07/2019, 11/28/2019, 10/25/2020   Pneumococcal Conjugate-13 12/31/2013   Pneumococcal Polysaccharide-23 01/26/2015   Tdap 10/16/2002,  02/24/2013   Zoster Recombinat (Shingrix) 03/14/2017, 05/21/2017   Zoster, Live 12/13/2010    TDAP status: Up to date  Flu Vaccine status: Due, Education has been provided regarding the importance of this vaccine. Advised may receive this vaccine at local pharmacy or Health Dept. Aware to provide a copy of the vaccination record if obtained from local pharmacy or Health Dept. Verbalized acceptance and understanding.  Pneumococcal vaccine status: Up to date  Covid-19 vaccine status: Completed vaccines  Qualifies for Shingles Vaccine? Yes   Zostavax completed Yes   Shingrix Completed?: Yes  Screening Tests Health Maintenance  Topic Date Due   COVID-19 Vaccine (4 - Booster for Pfizer series) 01/23/2021   INFLUENZA VACCINE  05/16/2021   HEMOGLOBIN A1C  07/16/2021   FOOT EXAM  01/14/2022   OPHTHALMOLOGY EXAM  04/06/2022   MAMMOGRAM  06/22/2022   TETANUS/TDAP  02/25/2023   COLONOSCOPY (Pts 45-29yr Insurance coverage will need to be confirmed)  04/30/2024   DEXA SCAN  Completed   Hepatitis C Screening  Completed   PNA vac Low Risk Adult  Completed   Zoster Vaccines- Shingrix  Completed   HPV VACCINES  Aged Out    Health Maintenance  Health Maintenance Due  Topic Date Due   COVID-19 Vaccine (4 - Booster for Pfizer series) 01/23/2021   INFLUENZA VACCINE  05/16/2021    Colorectal cancer screening: Type  of screening: Colonoscopy. Completed 04/30/14. Repeat every 10 years  Mammogram status: Completed 06/22/20. Repeat every year  Bone Density status: Completed 02/23/21. Results reflect: Bone density results: OSTEOPOROSIS. Repeat every 2 years.   Additional Screening:  Hepatitis C Screening:  Completed 02/23/21  Vision Screening: Recommended annual ophthalmology exams for early detection of glaucoma and other disorders of the eye. Is the patient up to date with their annual eye exam?  Yes  Who is the provider or what is the name of the office in which the patient attends annual  eye exams? Dr Delman Cheadle If pt is not established with a provider, would they like to be referred to a provider to establish care? No .   Dental Screening: Recommended annual dental exams for proper oral hygiene  Community Resource Referral / Chronic Care Management: CRR required this visit?  No   CCM required this visit?  No      Plan:     I have personally reviewed and noted the following in the patient's chart:   Medical and social history Use of alcohol, tobacco or illicit drugs  Current medications and supplements including opioid prescriptions.  Functional ability and status Nutritional status Physical activity Advanced directives List of other physicians Hospitalizations, surgeries, and ER visits in previous 12 months Vitals Screenings to include cognitive, depression, and falls Referrals and appointments  In addition, I have reviewed and discussed with patient certain preventive protocols, quality metrics, and best practice recommendations. A written personalized care plan for preventive services as well as general preventive health recommendations were provided to patient.     Willette Brace, LPN   [id number]   Nurse Notes: None

## 2021-06-06 NOTE — Patient Instructions (Signed)
[name] , Thank you for taking time to come for your Medicare Wellness Visit. I appreciate your ongoing commitment to your health goals. Please review the following plan we discussed and let me know if I can assist you in the future.   Screening recommendations/referrals: Colonoscopy: Done 04/30/14 repeat every 10 years due 04/30/24 Mammogram: Done 06/22/20 repeat every year  Bone Density: Done 02/23/21 repeat every 2 years  Recommended yearly ophthalmology/optometry visit for glaucoma screening and checkup Recommended yearly dental visit for hygiene and checkup  Vaccinations: Influenza vaccine: Due  Pneumococcal vaccine: Completed  Tdap vaccine: Done 02/24/13 repeat every 10 years 02/25/23 Shingles vaccine: Completed 03/14/17 & 05/21/17   Covid-19:Completed 1/22, 11/28/19, & 10/25/20  Advanced directives: Please bring a copy of your health care power of attorney and living will to the office at your convenience.  Conditions/risks identified: Get A1C lower  Next appointment: Follow up in one year for your annual wellness visit    Preventive Care 65 Years and Older, Female Preventive care refers to lifestyle choices and visits with your health care provider that can promote health and wellness. What does preventive care include? A yearly physical exam. This is also called an annual well check. Dental exams once or twice a year. Routine eye exams. Ask your health care provider how often you should have your eyes checked. Personal lifestyle choices, including: Daily care of your teeth and gums. Regular physical activity. Eating a healthy diet. Avoiding tobacco and drug use. Limiting alcohol use. Practicing safe sex. Taking low-dose aspirin every day. Taking vitamin and mineral supplements as recommended by your health care provider. What happens during an annual well check? The services and screenings done by your health care provider during your annual well check will depend on your age,  overall health, lifestyle risk factors, and family history of disease. Counseling  Your health care provider may ask you questions about your: Alcohol use. Tobacco use. Drug use. Emotional well-being. Home and relationship well-being. Sexual activity. Eating habits. History of falls. Memory and ability to understand (cognition). Work and work Statistician. Reproductive health. Screening  You may have the following tests or measurements: Height, weight, and BMI. Blood pressure. Lipid and cholesterol levels. These may be checked every 5 years, or more frequently if you are over 26 years old. Skin check. Lung cancer screening. You may have this screening every year starting at age 72 if you have a 30-pack-year history of smoking and currently smoke or have quit within the past 15 years. Fecal occult blood test (FOBT) of the stool. You may have this test every year starting at age 62. Flexible sigmoidoscopy or colonoscopy. You may have a sigmoidoscopy every 5 years or a colonoscopy every 10 years starting at age 11. Hepatitis C blood test. Hepatitis B blood test. Sexually transmitted disease (STD) testing. Diabetes screening. This is done by checking your blood sugar (glucose) after you have not eaten for a while (fasting). You may have this done every 1-3 years. Bone density scan. This is done to screen for osteoporosis. You may have this done starting at age 27. Mammogram. This may be done every 1-2 years. Talk to your health care provider about how often you should have regular mammograms. Talk with your health care provider about your test results, treatment options, and if necessary, the need for more tests. Vaccines  Your health care provider may recommend certain vaccines, such as: Influenza vaccine. This is recommended every year. Tetanus, diphtheria, and acellular pertussis (Tdap, Td)  vaccine. You may need a Td booster every 10 years. Zoster vaccine. You may need this after age  50. Pneumococcal 13-valent conjugate (PCV13) vaccine. One dose is recommended after age 73. Pneumococcal polysaccharide (PPSV23) vaccine. One dose is recommended after age 58. Talk to your health care provider about which screenings and vaccines you need and how often you need them. This information is not intended to replace advice given to you by your health care provider. Make sure you discuss any questions you have with your health care provider. Document Released: 10/29/2015 Document Revised: 06/21/2016 Document Reviewed: 08/03/2015 Elsevier Interactive Patient Education  2017 [place] Prevention in the Home Falls can cause injuries. They can happen to people of all ages. There are many things you can do to make your home safe and to help prevent falls. What can I do on the outside of my home? Regularly fix the edges of walkways and driveways and fix any cracks. Remove anything that might make you trip as you walk through a door, such as a raised step or threshold. Trim any bushes or trees on the path to your home. Use bright outdoor lighting. Clear any walking paths of anything that might make someone trip, such as rocks or tools. Regularly check to see if handrails are loose or broken. Make sure that both sides of any steps have handrails. Any raised decks and porches should have guardrails on the edges. Have any leaves, snow, or ice cleared regularly. Use sand or salt on walking paths during winter. Clean up any spills in your garage right away. This includes oil or grease spills. What can I do in the bathroom? Use night lights. Install grab bars by the toilet and in the tub and shower. Do not use towel bars as grab bars. Use non-skid mats or decals in the tub or shower. If you need to sit down in the shower, use a plastic, non-slip stool. Keep the floor dry. Clean up any water that spills on the floor as soon as it happens. Remove soap buildup in the tub or shower  regularly. Attach bath mats securely with double-sided non-slip rug tape. Do not have throw rugs and other things on the floor that can make you trip. What can I do in the bedroom? Use night lights. Make sure that you have a light by your bed that is easy to reach. Do not use any sheets or blankets that are too big for your bed. They should not hang down onto the floor. Have a firm chair that has side arms. You can use this for support while you get dressed. Do not have throw rugs and other things on the floor that can make you trip. What can I do in the kitchen? Clean up any spills right away. Avoid walking on wet floors. Keep items that you use a lot in easy-to-reach places. If you need to reach something above you, use a strong step stool that has a grab bar. Keep electrical cords out of the way. Do not use floor polish or wax that makes floors slippery. If you must use wax, use non-skid floor wax. Do not have throw rugs and other things on the floor that can make you trip. What can I do with my stairs? Do not leave any items on the stairs. Make sure that there are handrails on both sides of the stairs and use them. Fix handrails that are broken or loose. Make sure that handrails are as long  as the stairways. Check any carpeting to make sure that it is firmly attached to the stairs. Fix any carpet that is loose or worn. Avoid having throw rugs at the top or bottom of the stairs. If you do have throw rugs, attach them to the floor with carpet tape. Make sure that you have a light switch at the top of the stairs and the bottom of the stairs. If you do not have them, ask someone to add them for you. What else can I do to help prevent falls? Wear shoes that: Do not have high heels. Have rubber bottoms. Are comfortable and fit you well. Are closed at the toe. Do not wear sandals. If you use a stepladder: Make sure that it is fully opened. Do not climb a closed stepladder. Make sure that  both sides of the stepladder are locked into place. Ask someone to hold it for you, if possible. Clearly mark and make sure that you can see: Any grab bars or handrails. First and last steps. Where the edge of each step is. Use tools that help you move around (mobility aids) if they are needed. These include: Canes. Walkers. Scooters. Crutches. Turn on the lights when you go into a dark area. Replace any light bulbs as soon as they burn out. Set up your furniture so you have a clear path. Avoid moving your furniture around. If any of your floors are uneven, fix them. If there are any pets around you, be aware of where they are. Review your medicines with your doctor. Some medicines can make you feel dizzy. This can increase your chance of falling. Ask your doctor what other things that you can do to help prevent falls. This information is not intended to replace advice given to you by your health care provider. Make sure you discuss any questions you have with your health care provider. Document Released: 07/29/2009 Document Revised: 03/09/2016 Document Reviewed: 11/06/2014 Elsevier Interactive Patient Education  2017 [employer].
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
## 2021-06-08 NOTE — Telephone Encounter (Signed)
Spoke to pt told her Alyssa said we can try a round of steroids. Ok to call in Prednisone 20 mg 1 tab po BID x 5 days (#10 NR). Please note that sugar levels may be elevated from the use of steroids.  Pt verbalized understanding and said she has an appt on Friday 8/26 with Dr. Yong Channel and will wait til the. Told her okay and that Post-viral cough, could linger for weeks unfortunately. Pt verbalized understanding and said she has had this for one month.

## 2021-06-10 ENCOUNTER — Ambulatory Visit (INDEPENDENT_AMBULATORY_CARE_PROVIDER_SITE_OTHER): Payer: Medicare HMO | Admitting: Family Medicine

## 2021-06-10 ENCOUNTER — Other Ambulatory Visit: Payer: Self-pay

## 2021-06-10 ENCOUNTER — Encounter: Payer: Self-pay | Admitting: Family Medicine

## 2021-06-10 VITALS — BP 124/79 | HR 71 | Temp 97.7°F | Ht 64.0 in | Wt 152.4 lb

## 2021-06-10 DIAGNOSIS — I152 Hypertension secondary to endocrine disorders: Secondary | ICD-10-CM | POA: Diagnosis not present

## 2021-06-10 DIAGNOSIS — R69 Illness, unspecified: Secondary | ICD-10-CM | POA: Diagnosis not present

## 2021-06-10 DIAGNOSIS — E119 Type 2 diabetes mellitus without complications: Secondary | ICD-10-CM

## 2021-06-10 DIAGNOSIS — F418 Other specified anxiety disorders: Secondary | ICD-10-CM

## 2021-06-10 DIAGNOSIS — E559 Vitamin D deficiency, unspecified: Secondary | ICD-10-CM | POA: Diagnosis not present

## 2021-06-10 DIAGNOSIS — E1159 Type 2 diabetes mellitus with other circulatory complications: Secondary | ICD-10-CM | POA: Diagnosis not present

## 2021-06-10 DIAGNOSIS — E1169 Type 2 diabetes mellitus with other specified complication: Secondary | ICD-10-CM

## 2021-06-10 DIAGNOSIS — K219 Gastro-esophageal reflux disease without esophagitis: Secondary | ICD-10-CM | POA: Diagnosis not present

## 2021-06-10 DIAGNOSIS — E785 Hyperlipidemia, unspecified: Secondary | ICD-10-CM

## 2021-06-10 LAB — COMPREHENSIVE METABOLIC PANEL
ALT: 16 U/L (ref 0–35)
AST: 22 U/L (ref 0–37)
Albumin: 4 g/dL (ref 3.5–5.2)
Alkaline Phosphatase: 65 U/L (ref 39–117)
BUN: 13 mg/dL (ref 6–23)
CO2: 31 mEq/L (ref 19–32)
Calcium: 9.3 mg/dL (ref 8.4–10.5)
Chloride: 102 mEq/L (ref 96–112)
Creatinine, Ser: 0.84 mg/dL (ref 0.40–1.20)
GFR: 69.48 mL/min (ref >60.00)
Glucose, Bld: 96 mg/dL (ref 70–99)
Potassium: 4.2 mEq/L (ref 3.5–5.1)
Sodium: 142 mEq/L (ref 135–145)
Total Bilirubin: 0.4 mg/dL (ref 0.2–1.2)
Total Protein: 6.9 g/dL (ref 6.0–8.3)

## 2021-06-10 LAB — LDL CHOLESTEROL, DIRECT: Direct LDL: 47 mg/dL

## 2021-06-10 LAB — HEMOGLOBIN A1C: Hgb A1c MFr Bld: 6.9 % — ABNORMAL HIGH (ref 4.6–6.5)

## 2021-06-10 NOTE — Patient Instructions (Addendum)
Health Maintenance Due  Topic Date Due   COVID-19 Vaccine (4 - Booster for [employer] series)  - consider getting the new variant specific COVID-19 vaccination perhaps at least 4 months after having covid 01/23/2021   INFLUENZA VACCINE   - Please consider getting your flu shot in the Fall. If you get this outside of our office, please let us know.  05/16/2021   Please stop by lab before you go If you have mychart- we will send your results within 3 business days of [place] receiving them.  If you do not have mychart- we will call you about results within 5 business days of [place] receiving them.  *please also note that you will see labs on mychart as soon as they post. I will later go in and write notes on them- will say "notes from Dr. Yong Channel"  Let us know if cough fails to continue to improve- we can still order x-ray at later date  Recommended follow up: No follow-ups on file.

## 2021-06-29 ENCOUNTER — Other Ambulatory Visit: Payer: Self-pay

## 2021-06-29 ENCOUNTER — Ambulatory Visit
Admission: RE | Admit: 2021-06-29 | Discharge: 2021-06-29 | Disposition: A | Discharge status: Home / Self Care | Payer: Medicare HMO | Source: Ambulatory Visit | Attending: Family Medicine | Admitting: Family Medicine

## 2021-06-29 DIAGNOSIS — Z1231 Encounter for screening mammogram for malignant neoplasm of breast: Secondary | ICD-10-CM | POA: Diagnosis not present

## 2021-07-22 DIAGNOSIS — M67912 Unspecified disorder of synovium and tendon, left shoulder: Secondary | ICD-10-CM | POA: Diagnosis not present

## 2021-07-22 DIAGNOSIS — M67911 Unspecified disorder of synovium and tendon, right shoulder: Secondary | ICD-10-CM | POA: Diagnosis not present

## 2021-08-03 ENCOUNTER — Ambulatory Visit (INDEPENDENT_AMBULATORY_CARE_PROVIDER_SITE_OTHER): Payer: Medicare HMO

## 2021-08-03 ENCOUNTER — Other Ambulatory Visit: Payer: Self-pay

## 2021-08-03 ENCOUNTER — Encounter: Payer: Self-pay | Admitting: Podiatry

## 2021-08-03 ENCOUNTER — Ambulatory Visit: Payer: Medicare HMO | Admitting: Podiatry

## 2021-08-03 DIAGNOSIS — M779 Enthesopathy, unspecified: Secondary | ICD-10-CM | POA: Diagnosis not present

## 2021-08-03 DIAGNOSIS — M76811 Anterior tibial syndrome, right leg: Secondary | ICD-10-CM

## 2021-08-03 MED ORDER — TRIAMCINOLONE ACETONIDE 10 MG/ML IJ SUSP
10.0000 mg | Freq: Once | INTRAMUSCULAR | Status: AC
Start: 2021-08-03 — End: 2021-08-03
  Administered 2021-08-03: 10 mg

## 2021-08-03 NOTE — Progress Notes (Signed)
Subjective:   Patient ID: Candice Guerrero, female   DOB: 70 y.o.   MRN: [id number]   HPI Patient presents with a lot of pain on top of her right foot stating its been going on for at least 2 weeks and she does not remember injury.  Patient did work for a Art therapist for 40 years in [place] does not smoke likes to be active   Review of Systems  All other systems reviewed and are negative.      Objective:  Physical Exam Vitals and nursing note reviewed.  Constitutional:      Appearance: She is well-developed.  Pulmonary:     Effort: Pulmonary effort is normal.  Musculoskeletal:        General: Normal range of motion.  Skin:    General: Skin is warm.  Neurological:     Mental Status: She is alert.    Neurovascular status intact muscle strength adequate range of motion adequate with inflammation of the anterior tibial tendon right as it comes across the ankle with no indication of tendon dysfunction when tested.  Patient has good digital perfusion well oriented x3 in mild discomfort in the outside of the foot     Assessment:  .  Anterior tibial tendinitis right with inflammation along with probable compensatory outside pain no indication of muscle tear     Plan:  H&P reviewed condition careful sterile prep and did sheath injection right anterior tib 3 mg Kenalog 5 mg Xylocaine advised on ice and utilization of brace to provide stability and did discuss possible boot usage in future if symptoms persist  X-rays indicate no signs of fracture with this no signs of bony injury in the area of pain

## 2021-08-17 ENCOUNTER — Ambulatory Visit: Payer: Medicare HMO | Admitting: Podiatry

## 2021-08-22 ENCOUNTER — Other Ambulatory Visit: Payer: Self-pay | Admitting: Family Medicine

## 2021-08-25 ENCOUNTER — Encounter: Payer: Self-pay | Admitting: Podiatry

## 2021-08-25 ENCOUNTER — Ambulatory Visit: Payer: Medicare HMO | Admitting: Podiatry

## 2021-08-25 ENCOUNTER — Other Ambulatory Visit: Payer: Self-pay

## 2021-08-25 DIAGNOSIS — M76811 Anterior tibial syndrome, right leg: Secondary | ICD-10-CM | POA: Diagnosis not present

## 2021-08-25 NOTE — Progress Notes (Signed)
Subjective:   Patient ID: Candice Guerrero, female   DOB: 70 y.o.   MRN: [id number]   HPI Patient presents stating she has a lot of pain in the anterior tib and stated it did get better for a period of time afterwards but is reoccurred again   ROS      Objective:  Physical Exam  Neurovascular status intact anterior tibial tendon functioning well but quite inflamed as it crosses the ankle localized to this area with no indications currently of tendon dysfunction     Assessment:  Appears to be inflammatory anterior tibial tendinitis right that did not respond to conservative treatment     Plan:  H&P reviewed condition and may have to consider MRI but we are going to first try immobilization with a air fracture walker ice therapy and considerations for physical therapy that I educated her on today.  Boot dispensed fits well will be seen back to recheck

## 2021-08-29 DIAGNOSIS — L821 Other seborrheic keratosis: Secondary | ICD-10-CM | POA: Diagnosis not present

## 2021-08-29 DIAGNOSIS — Z23 Encounter for immunization: Secondary | ICD-10-CM | POA: Diagnosis not present

## 2021-08-29 DIAGNOSIS — L578 Other skin changes due to chronic exposure to nonionizing radiation: Secondary | ICD-10-CM | POA: Diagnosis not present

## 2021-08-29 DIAGNOSIS — L988 Other specified disorders of the skin and subcutaneous tissue: Secondary | ICD-10-CM | POA: Diagnosis not present

## 2021-08-29 DIAGNOSIS — Z86006 Personal history of melanoma in-situ: Secondary | ICD-10-CM | POA: Diagnosis not present

## 2021-08-29 DIAGNOSIS — D225 Melanocytic nevi of trunk: Secondary | ICD-10-CM | POA: Diagnosis not present

## 2021-08-29 DIAGNOSIS — L400 Psoriasis vulgaris: Secondary | ICD-10-CM | POA: Diagnosis not present

## 2021-08-29 DIAGNOSIS — D485 Neoplasm of uncertain behavior of skin: Secondary | ICD-10-CM | POA: Diagnosis not present

## 2021-08-29 DIAGNOSIS — L814 Other melanin hyperpigmentation: Secondary | ICD-10-CM | POA: Diagnosis not present

## 2021-08-29 DIAGNOSIS — D229 Melanocytic nevi, unspecified: Secondary | ICD-10-CM | POA: Diagnosis not present

## 2021-08-29 DIAGNOSIS — D1721 Benign lipomatous neoplasm of skin and subcutaneous tissue of right arm: Secondary | ICD-10-CM | POA: Diagnosis not present

## 2021-08-29 DIAGNOSIS — D2272 Melanocytic nevi of left lower limb, including hip: Secondary | ICD-10-CM | POA: Diagnosis not present

## 2021-09-02 DIAGNOSIS — M7671 Peroneal tendinitis, right leg: Secondary | ICD-10-CM | POA: Diagnosis not present

## 2021-09-02 DIAGNOSIS — M79671 Pain in right foot: Secondary | ICD-10-CM | POA: Diagnosis not present
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
## 2021-11-18 ENCOUNTER — Encounter: Payer: Self-pay | Admitting: Family Medicine

## 2021-11-18 ENCOUNTER — Ambulatory Visit (INDEPENDENT_AMBULATORY_CARE_PROVIDER_SITE_OTHER): Payer: Medicare HMO | Admitting: Family Medicine

## 2021-11-18 ENCOUNTER — Other Ambulatory Visit: Payer: Self-pay

## 2021-11-18 VITALS — BP 122/78 | HR 89 | Temp 97.5°F | Ht 61.0 in | Wt 155.0 lb

## 2021-11-18 DIAGNOSIS — R69 Illness, unspecified: Secondary | ICD-10-CM | POA: Diagnosis not present

## 2021-11-18 DIAGNOSIS — K219 Gastro-esophageal reflux disease without esophagitis: Secondary | ICD-10-CM | POA: Diagnosis not present

## 2021-11-18 DIAGNOSIS — E559 Vitamin D deficiency, unspecified: Secondary | ICD-10-CM

## 2021-11-18 DIAGNOSIS — Z0000 Encounter for general adult medical examination without abnormal findings: Secondary | ICD-10-CM | POA: Diagnosis not present

## 2021-11-18 DIAGNOSIS — E1169 Type 2 diabetes mellitus with other specified complication: Secondary | ICD-10-CM

## 2021-11-18 DIAGNOSIS — F418 Other specified anxiety disorders: Secondary | ICD-10-CM

## 2021-11-18 DIAGNOSIS — I10 Essential (primary) hypertension: Secondary | ICD-10-CM

## 2021-11-18 DIAGNOSIS — E119 Type 2 diabetes mellitus without complications: Secondary | ICD-10-CM | POA: Diagnosis not present

## 2021-11-18 DIAGNOSIS — E785 Hyperlipidemia, unspecified: Secondary | ICD-10-CM | POA: Diagnosis not present

## 2021-11-18 NOTE — Patient Instructions (Addendum)
Please stop by lab before you go If you have mychart- we will send your results within 3 business days of [place] receiving them.  If you do not have mychart- we will call you about results within 5 business days of [place] receiving them.  *please also note that you will see labs on mychart as soon as they post. I will later go in and write notes on them- will say "notes from Dr. Yong Channel"   No changes today unless labs lead [place] to make changes  Let me know if you change your mind and get bivalent booster  Recommended follow up: Return in about 6 months (around 05/18/2022) for follow up- or sooner if needed.

## 2021-11-19 ENCOUNTER — Encounter: Payer: Self-pay | Admitting: Family Medicine

## 2021-11-19 LAB — COMPREHENSIVE METABOLIC PANEL
AG Ratio: 1.7 (calc) (ref 1.0–2.5)
ALT: 26 U/L (ref 6–29)
AST: 27 U/L (ref 10–35)
Albumin: 4.5 g/dL (ref 3.6–5.1)
Alkaline phosphatase (APISO): 75 U/L (ref 37–153)
BUN: 15 mg/dL (ref 7–25)
CO2: 27 mmol/L (ref 20–32)
Calcium: 10.4 mg/dL (ref 8.6–10.4)
Chloride: 100 mmol/L (ref 98–110)
Creat: 0.9 mg/dL (ref 0.60–1.00)
Globulin: 2.7 g/dL (calc) (ref 1.9–3.7)
Glucose, Bld: 71 mg/dL (ref 65–99)
Potassium: 4.4 mmol/L (ref 3.5–5.3)
Sodium: 142 mmol/L (ref 135–146)
Total Bilirubin: 0.4 mg/dL (ref 0.2–1.2)
Total Protein: 7.2 g/dL (ref 6.1–8.1)

## 2021-11-19 LAB — CBC WITH DIFFERENTIAL/PLATELET
Absolute Monocytes: 1009 cells/uL — ABNORMAL HIGH (ref 200–950)
Basophils Absolute: 52 cells/uL (ref 0–200)
Basophils Relative: 0.5 %
Eosinophils Absolute: 82 cells/uL (ref 15–500)
Eosinophils Relative: 0.8 %
HCT: 42.1 % (ref 35.0–45.0)
Hemoglobin: 13.5 g/dL (ref 11.7–15.5)
Lymphs Abs: 3718 cells/uL (ref 850–3900)
MCH: 26.7 pg — ABNORMAL LOW (ref 27.0–33.0)
MCHC: 32.1 g/dL (ref 32.0–36.0)
MCV: 83.4 fL (ref 80.0–100.0)
MPV: 13.2 fL — ABNORMAL HIGH (ref 7.5–12.5)
Monocytes Relative: 9.8 %
Neutro Abs: 5438 cells/uL (ref 1500–7800)
Neutrophils Relative %: 52.8 %
Platelets: 291 10*3/uL (ref 140–400)
RBC: 5.05 10*6/uL (ref 3.80–5.10)
RDW: 13 % (ref 11.0–15.0)
Total Lymphocyte: 36.1 %
WBC: 10.3 10*3/uL (ref 3.8–10.8)

## 2021-11-19 LAB — LIPID PANEL
Cholesterol: 114 mg/dL (ref <200)
HDL: 48 mg/dL — ABNORMAL LOW (ref >=50)
LDL Cholesterol (Calc): 40 mg/dL (calc)
Non-HDL Cholesterol (Calc): 66 mg/dL (calc) (ref <130)
Total CHOL/HDL Ratio: 2.4 (calc) (ref <5.0)
Triglycerides: 180 mg/dL — ABNORMAL HIGH (ref <150)

## 2021-11-19 LAB — HEMOGLOBIN A1C
Hgb A1c MFr Bld: 6.9 % of total Hgb — ABNORMAL HIGH (ref <5.7)
Mean Plasma Glucose: 151 mg/dL
eAG (mmol/L): 8.4 mmol/L

## 2021-11-19 LAB — VITAMIN D 25 HYDROXY (VIT D DEFICIENCY, FRACTURES): Vit D, 25-Hydroxy: 58 ng/mL (ref 30–100)

## 2021-11-23 NOTE — Progress Notes (Signed)
Patient viewed results via [facility].   Seen by patient Candice Guerrero on 11/22/2021 12:19 PM

## 2022-01-11 ENCOUNTER — Other Ambulatory Visit: Payer: Self-pay | Admitting: Family Medicine

## 2022-02-27 DIAGNOSIS — L578 Other skin changes due to chronic exposure to nonionizing radiation: Secondary | ICD-10-CM | POA: Diagnosis not present

## 2022-02-27 DIAGNOSIS — L409 Psoriasis, unspecified: Secondary | ICD-10-CM | POA: Diagnosis not present

## 2022-02-27 DIAGNOSIS — L821 Other seborrheic keratosis: Secondary | ICD-10-CM | POA: Diagnosis not present

## 2022-02-27 DIAGNOSIS — D2272 Melanocytic nevi of left lower limb, including hip: Secondary | ICD-10-CM | POA: Diagnosis not present

## 2022-02-27 DIAGNOSIS — D225 Melanocytic nevi of trunk: Secondary | ICD-10-CM | POA: Diagnosis not present

## 2022-02-27 DIAGNOSIS — L814 Other melanin hyperpigmentation: Secondary | ICD-10-CM | POA: Diagnosis not present

## 2022-02-27 DIAGNOSIS — Z86006 Personal history of melanoma in-situ: Secondary | ICD-10-CM | POA: Diagnosis not present

## 2022-03-07 ENCOUNTER — Other Ambulatory Visit: Payer: Self-pay | Admitting: Family Medicine

## 2022-04-10 DIAGNOSIS — H5213 Myopia, bilateral: Secondary | ICD-10-CM | POA: Diagnosis not present

## 2022-04-10 DIAGNOSIS — E119 Type 2 diabetes mellitus without complications: Secondary | ICD-10-CM | POA: Diagnosis not present

## 2022-04-10 DIAGNOSIS — H25813 Combined forms of age-related cataract, bilateral: Secondary | ICD-10-CM | POA: Diagnosis not present

## 2022-04-10 LAB — HM DIABETES EYE EXAM

## 2022-04-11 ENCOUNTER — Encounter: Payer: Self-pay | Admitting: Family Medicine

## 2022-04-12 ENCOUNTER — Ambulatory Visit (INDEPENDENT_AMBULATORY_CARE_PROVIDER_SITE_OTHER): Payer: Medicare HMO | Admitting: Family Medicine

## 2022-04-12 VITALS — BP 128/74 | HR 99 | Temp 97.7°F | Ht 61.0 in | Wt 151.0 lb

## 2022-04-12 DIAGNOSIS — B349 Viral infection, unspecified: Secondary | ICD-10-CM | POA: Diagnosis not present

## 2022-04-12 DIAGNOSIS — J029 Acute pharyngitis, unspecified: Secondary | ICD-10-CM | POA: Diagnosis not present

## 2022-04-12 LAB — POC COVID19 BINAXNOW: SARS Coronavirus 2 Ag: NEGATIVE

## 2022-04-12 MED ORDER — PROMETHAZINE HCL 25 MG PO TABS: 25.0000 mg | ORAL_TABLET | Freq: Four times a day (QID) | ORAL | 0 refills | Status: DC | PRN

## 2022-04-12 NOTE — Patient Instructions (Signed)
Please follow up if symptoms do not improve or as needed.    Viral Illness, Adult Viruses are tiny germs that can get into a person's body and cause illness. There are many different types of viruses, and they cause many types of illness. Viral illnesses can range from mild to severe. They can affect various parts of the body. Short-term conditions that are caused by a virus include colds and the flu (influenza). Long-term conditions that are caused by a virus include herpes, shingles, and HIV (human immunodeficiency virus) infection. A few viruses have been linked to certain cancers. What are the causes? Many types of viruses can cause illness. Viruses invade cells in your body, multiply, and cause the infected cells to work abnormally or die. When these cells die, they release more of the virus. When this happens, you develop symptoms of the illness, and the virus continues to spread to other cells. If the virus takes over the function of the cell, it can cause the cell to divide and grow out of control. This happens when a virus causes cancer. Different viruses get into the body in different ways. You can get a virus by: Swallowing food or water that has come in contact with the virus (is contaminated). Breathing in droplets that have been coughed or sneezed into the air by an infected person. Touching a surface that has been contaminated with the virus and then touching your eyes, nose, or mouth. Being bitten by an insect or animal that carries the virus. Having sexual contact with a person who is infected with the virus. Being exposed to blood or fluids that contain the virus, either through an open cut or during a transfusion. If a virus enters your body, your body's defense system (immune system) will try to fight the virus. You may be at higher risk for a viral illness if your immune system is weak. What are the signs or symptoms? You may have these symptoms, depending on the type of virus  and the location of the cells that it invades: Cold and flu viruses: Fever. Headache. Sore throat. Muscle aches. Stuffy nose (nasal congestion). Cough. Digestive system (gastrointestinal) viruses: Fever. Pain in the abdomen. Nausea. Diarrhea. Liver viruses (hepatitis): Loss of appetite. Tiredness. Skin or the white parts of your eyes turning yellow (jaundice). Brain and spinal cord viruses: Fever. Headache. Stiff neck. Nausea and vomiting. Confusion or sleepiness. Skin viruses: Warts. Itching. Rash. Sexually transmitted viruses: Discharge. Swelling. Redness. Rash. How is this diagnosed? This condition may be diagnosed based on one or more of the following: Symptoms. Medical history. Physical exam. Blood test, sample of mucus from your lungs (sputum sample), stool sample, or a swab of body fluids or a skin sore (lesion). How is this treated? Viruses can be hard to treat because they live within cells. Antibiotic medicines do not treat viruses because these medicines do not get inside cells. Treatment for a viral illness may include: Resting and drinking plenty of fluids. Medicines to relieve symptoms. These can include over-the-counter medicine for pain and fever, medicines for cough or congestion, and medicines to relieve diarrhea. Antiviral medicines. These medicines are available only for certain types of viruses. Some viral illnesses can be prevented with vaccinations. A common example is the flu shot. Follow these instructions at home: Medicines Take over-the-counter and prescription medicines only as told by your health care provider. If you were prescribed an antiviral medicine, take it as told by your health care provider. Do not stop taking the  antiviral even if you start to feel better. Be aware of when antibiotics are needed and when they are not needed. Antibiotics do not treat viruses. You may get an antibiotic if your health care provider thinks that you  may have, or are at risk for, a bacterial infection and you have a viral infection. Do not ask for an antibiotic prescription if you have been diagnosed with a viral illness. Antibiotics will not make your illness go away faster. Frequently taking antibiotics when they are not needed can lead to antibiotic resistance. When this develops, the medicine no longer works against the bacteria that it normally fights. General instructions  Drink enough fluids to keep your urine pale yellow. Rest as much as possible. Return to your normal activities as told by your health care provider. Ask your health care provider what activities are safe for you. Keep all follow-up visits as told by your health care provider. This is important. How is this prevented? To reduce your risk of viral illness: Wash your hands often with soap and water for at least 20 seconds. If soap and water are not available, use hand sanitizer. Avoid touching your nose, eyes, and mouth, especially if you have not washed your hands recently. If anyone in your household has a viral infection, clean all household surfaces that may have been in contact with the virus. Use soap and hot water. You may also use bleach that you have added water to (diluted). Stay away from people who are sick with symptoms of a viral infection. Do not share items such as toothbrushes and water bottles with other people. Keep your vaccinations up to date. This includes getting a yearly flu shot. Eat a healthy diet and get plenty of rest. Contact a health care provider if: You have symptoms of a viral illness that do not go away. Your symptoms come back after going away. Your symptoms get worse. Get help right away if you have: Trouble breathing. A severe headache or a stiff neck. Severe vomiting or pain in your abdomen. These symptoms may represent a serious problem that is an emergency. Do not wait to see if the symptoms will go away. Get medical help  right away. Call your local emergency services (911 in the [place] Do not drive yourself to the hospital. Summary Viruses are types of germs that can get into a person's body and cause illness. Viral illnesses can range from mild to severe. They can affect various parts of the body. Viruses can be hard to treat. There are medicines to relieve symptoms, and there are some antiviral medicines. If you were prescribed an antiviral medicine, take it as told by your health care provider. Do not stop taking the antiviral even if you start to feel better. Contact a health care provider if you have symptoms of a viral illness that do not go away. This information is not intended to replace advice given to you by your health care provider. Make sure you discuss any questions you have with your health care provider. Document Revised: 02/16/2020 Document Reviewed: 08/12/2019 Elsevier Patient Education  [place].

## 2022-04-12 NOTE — Progress Notes (Signed)
Subjective   CC:  Chief Complaint  Patient presents with   Sore Throat    Pt stated that she has a sore throat, headache and cough for the past 4 days. COVID test was taken 04/10/2022 and it was Neg.    Same day acute visit; PCP not available. New pt to me. Chart reviewed.   HPI: Candice Guerrero is a 71 y.o. female who presents to the office today to address the problems listed above in the chief complaint. Patient complains of flu like symptoms including myalgias, ST, mild cough and some congestion. Also has generalized headache and nausea. Sxs have been present for 4 days. She has tried to alleviate the sxs with over-the-counter medicines with mild relief. No high risk factors for influenza complications or high risk household contacts are present. No SOB or CP are present. Taking in fluids adequately; decreased appetite bt no significant v/d. She is a diabetic, well controlled.   Assessment  1. Sore throat   2. Viral syndrome      Plan  Viral syndrome: Recommend supportive care with fluids, Phenergan, Advil for headache myalgias and sore throat.  Follow up: If unimproved  Orders Placed This Encounter  Procedures   POC COVID-19   Meds ordered this encounter  Medications   promethazine (PHENERGAN) 25 MG tablet    Sig: Take 1 tablet (25 mg total) by mouth every 6 (six) hours as needed for nausea or vomiting.    Dispense:  20 tablet    Refill:  0     I reviewed the patients updated PMH, FH, and SocHx.    Patient Active Problem List   Diagnosis Date Noted   Vitamin D deficiency 01/14/2021   Tremor 04/30/2020   DDD (degenerative disc disease), cervical 01/29/2020   Hamstring tendinitis of left thigh 12/25/2019   Acute midline low back pain with left-sided sciatica 11/06/2018   Abnormal mammogram of right breast 07/18/2017   Osteoporosis 07/24/2014   IBS (irritable bowel syndrome) 07/24/2014   Allergic rhinitis 07/24/2014   Obesity (BMI 30-39.9) 12/31/2013   Psoriasis  12/13/2010   Benign hematuria 12/13/2010   Diabetes mellitus type II, controlled, with no complications ([place]) 26/71/2458   Hypertension associated with diabetes ([place]) 12/13/2010   GERD (gastroesophageal reflux disease) 12/13/2010   Depression with anxiety 12/13/2010   Fatty liver 12/13/2010   Hyperlipidemia associated with type 2 diabetes mellitus ([place]) 12/13/2010   Current Meds  Medication Sig   atorvastatin (LIPITOR) 40 MG tablet Take 1 tablet (40 mg total) by mouth daily.   calcium carbonate (OSCAL) 1500 (600 Ca) MG TABS tablet Take by mouth daily.   citalopram (CELEXA) 20 MG tablet TAKE 1 TABLET BY MOUTH EVERY DAY   metFORMIN (GLUCOPHAGE-XR) 500 MG 24 hr tablet Take 2 tablets (1,000 mg total) by mouth in the morning and at bedtime.   omeprazole (PRILOSEC) 20 MG capsule TAKE 1 CAPSULE BY MOUTH EVERY DAY   promethazine (PHENERGAN) 25 MG tablet Take 1 tablet (25 mg total) by mouth every 6 (six) hours as needed for nausea or vomiting.   rosuvastatin (CRESTOR) 40 MG tablet TAKE 1 TABLET BY MOUTH EVERY DAY   silver sulfADIAZINE (SILVADENE) 1 % cream Apply 1 application topically daily.   triamcinolone cream (KENALOG) 0.1 % APPLY TO AFFECTED AREA TWICE A DAY   valsartan-hydrochlorothiazide (DIOVAN-HCT) 80-12.5 MG tablet TAKE 1 TABLET DAILY   Family History: Patient family history includes Anesthesia problems in her sister; Diabetes in her maternal uncle; Healthy in  her son and son; High blood pressure in her brother; Hyperlipidemia in her mother; Hypertension in her father and mother; Kidney disease in her father; Prostate cancer in her brother; Renal Disease in her father; Tremor in her mother. Social History:  Patient  reports that she quit smoking about 23 years ago. Her smoking use included cigarettes. She has a 2.00 pack-year smoking history. She has never used smokeless tobacco. She reports current alcohol use. She reports that she does not use drugs.  Review of  Systems: Constitutional: negative for fever or malaise Ophthalmic: negative for photophobia, double vision or loss of vision Cardiovascular: negative for chest pain, dyspnea on exertion, or new LE swelling Respiratory: negative for SOB or persistent cough Gastrointestinal: negative for abdominal pain, change in bowel habits or melena Genitourinary: negative for dysuria or gross hematuria Musculoskeletal: negative for new gait disturbance or muscular weakness Integumentary: negative for new or persistent rashes Neurological: negative for TIA or stroke symptoms Psychiatric: negative for SI or delusions Allergic/Immunologic: negative for hives  Objective  Vitals: BP 128/74   Pulse 99   Temp 97.7 F (36.5 C)   Ht '5\' 1"'$  (1.549 m)   Wt 151 lb (68.5 kg)   LMP  (LMP Unknown)   SpO2 97%   BMI 28.53 kg/m  General: no acute respiratory distress, nontoxic Psych:  Alert and oriented, normal mood and affect HEENT: Normocephalic, nasal congestion present, TMs w/o erythema, OP with erythema w/o exudate, no LAD, supple neck  Cardiovascular:  RRR without murmur or gallop. no peripheral edema Respiratory:  Good breath sounds bilaterally, CTAB with normal respiratory effort Neurologic:   Mental status is normal. normal gait Commons side effects, risks, benefits, and alternatives for medications and treatment plan prescribed today were discussed, and the patient expressed understanding of the given instructions. Patient is instructed to call or message via MyChart if he/she has any questions or concerns regarding our treatment plan. No barriers to understanding were identified. We discussed Red Flag symptoms and signs in detail. Patient expressed understanding regarding what to do in case of urgent or emergency type symptoms.  Medication list was reconciled, printed and provided to the patient in AVS. Patient instructions and summary information was reviewed with the patient as documented in the AVS. This  note was prepared with assistance of Dragon voice recognition software. Occasional wrong-word or sound-a-like substitutions may have occurred due to the inherent limitations of voice recognition software

## 2022-04-13 ENCOUNTER — Encounter: Payer: Self-pay | Admitting: Family Medicine

## 2022-04-13 MED ORDER — GUAIFENESIN-CODEINE 100-10 MG/5ML PO SOLN: 5.0000 mL | Freq: Four times a day (QID) | ORAL | 0 refills | Status: DC | PRN

## 2022-04-15 ENCOUNTER — Other Ambulatory Visit: Payer: Self-pay | Admitting: Family Medicine

## 2022-04-16 DIAGNOSIS — H10021 Other mucopurulent conjunctivitis, right eye: Secondary | ICD-10-CM | POA: Diagnosis not present

## 2022-04-16 DIAGNOSIS — J029 Acute pharyngitis, unspecified: Secondary | ICD-10-CM | POA: Diagnosis not present

## 2022-04-24 ENCOUNTER — Ambulatory Visit (INDEPENDENT_AMBULATORY_CARE_PROVIDER_SITE_OTHER): Payer: Medicare HMO | Admitting: Family Medicine

## 2022-04-24 ENCOUNTER — Encounter: Payer: Self-pay | Admitting: Family Medicine

## 2022-04-24 VITALS — BP 116/60 | HR 90 | Temp 97.6°F | Ht 61.0 in | Wt 152.2 lb

## 2022-04-24 DIAGNOSIS — H10503 Unspecified blepharoconjunctivitis, bilateral: Secondary | ICD-10-CM | POA: Diagnosis not present

## 2022-04-24 DIAGNOSIS — J0100 Acute maxillary sinusitis, unspecified: Secondary | ICD-10-CM

## 2022-04-24 MED ORDER — AMOXICILLIN-POT CLAVULANATE 875-125 MG PO TABS: 1.0000 | ORAL_TABLET | Freq: Two times a day (BID) | ORAL | 0 refills | Status: AC

## 2022-04-24 NOTE — Progress Notes (Signed)
Subjective  CC:  Chief Complaint  Patient presents with   Conjunctivitis    Pt stated that she was seen at a med clinic in [place] and was given eye solution to take for 7 day. Ears are clogged and head stuffiness     HPI: Candice Guerrero is a 71 y.o. female who presents to the office today to address the problems listed above in the chief complaint. 71 year old female who is here a little over a week ago with a viral upper respiratory infection.  Unfortunately after being seen she developed conjunctivitis started in the right eye.  She was treated with polymyxin eyedrops.  It did not spread to the left.  This is mostly better although she has some crusting of the upper eyelid on the left remaining.  However, she still feels poorly.  D level is low.  Now describes a heaviness and pressure in her sinuses.  Feels very congested.  PND.  No dental pain.  No more fevers.  Fatigue.  No shortness of breath.  Cough is better.  Assessment  1. Acute non-recurrent maxillary sinusitis   2. Blepharoconjunctivitis of both eyes, unspecified blepharoconjunctivitis type      Plan  Secondary sinusitis: Augmentin 875 twice daily for 10 days.  Education given.  Mucinex.  Supportive care Conjunctivitis: Likely viral but improving.  She will continue eyedrops for 2-3 more days on the left eye only.  Supportive care recommended.  Follow up: As scheduled 05/18/2022  No orders of the defined types were placed in this encounter.  Meds ordered this encounter  Medications   amoxicillin-clavulanate (AUGMENTIN) 875-125 MG tablet    Sig: Take 1 tablet by mouth 2 (two) times daily for 10 days.    Dispense:  20 tablet    Refill:  0      I reviewed the patients updated PMH, FH, and SocHx.    Patient Active Problem List   Diagnosis Date Noted   Vitamin D deficiency 01/14/2021   Tremor 04/30/2020   DDD (degenerative disc disease), cervical 01/29/2020   Hamstring tendinitis of left thigh 12/25/2019   Acute  midline low back pain with left-sided sciatica 11/06/2018   Abnormal mammogram of right breast 07/18/2017   Osteoporosis 07/24/2014   IBS (irritable bowel syndrome) 07/24/2014   Allergic rhinitis 07/24/2014   Obesity (BMI 30-39.9) 12/31/2013   Psoriasis 12/13/2010   Benign hematuria 12/13/2010   Diabetes mellitus type II, controlled, with no complications ([place]) 30/86/5784   Hypertension associated with diabetes ([place]) 12/13/2010   GERD (gastroesophageal reflux disease) 12/13/2010   Depression with anxiety 12/13/2010   Fatty liver 12/13/2010   Hyperlipidemia associated with type 2 diabetes mellitus ([place]) 12/13/2010   Current Meds  Medication Sig   amoxicillin-clavulanate (AUGMENTIN) 875-125 MG tablet Take 1 tablet by mouth 2 (two) times daily for 10 days.   atorvastatin (LIPITOR) 40 MG tablet Take 1 tablet (40 mg total) by mouth daily.   calcium carbonate (OSCAL) 1500 (600 Ca) MG TABS tablet Take by mouth daily.   citalopram (CELEXA) 20 MG tablet TAKE 1 TABLET BY MOUTH EVERY DAY   guaiFENesin-codeine 100-10 MG/5ML syrup Take 5 mLs by mouth every 6 (six) hours as needed for cough.   metFORMIN (GLUCOPHAGE-XR) 500 MG 24 hr tablet Take 2 tablets (1,000 mg total) by mouth in the morning and at bedtime.   omeprazole (PRILOSEC) 20 MG capsule TAKE 1 CAPSULE BY MOUTH EVERY DAY   promethazine (PHENERGAN) 25 MG tablet Take 1 tablet (25 mg  total) by mouth every 6 (six) hours as needed for nausea or vomiting.   rosuvastatin (CRESTOR) 40 MG tablet TAKE 1 TABLET BY MOUTH EVERY DAY   silver sulfADIAZINE (SILVADENE) 1 % cream Apply 1 application topically daily.   triamcinolone cream (KENALOG) 0.1 % APPLY TO AFFECTED AREA TWICE A DAY   trimethoprim-polymyxin b (POLYTRIM) ophthalmic solution Place 1 drop into both eyes every 3 (three) hours.   valsartan-hydrochlorothiazide (DIOVAN-HCT) 80-12.5 MG tablet TAKE 1 TABLET DAILY    Allergies: Patient is allergic to oxycodone and altace [ramipril]. Family  History: Patient family history includes Anesthesia problems in her sister; Diabetes in her maternal uncle; Healthy in her son and son; High blood pressure in her brother; Hyperlipidemia in her mother; Hypertension in her father and mother; Kidney disease in her father; Prostate cancer in her brother; Renal Disease in her father; Tremor in her mother. Social History:  Patient  reports that she quit smoking about 23 years ago. Her smoking use included cigarettes. She has a 2.00 pack-year smoking history. She has never used smokeless tobacco. She reports current alcohol use. She reports that she does not use drugs.  Review of Systems: Constitutional: Negative for fever malaise or anorexia Cardiovascular: negative for chest pain Respiratory: negative for SOB or persistent cough Gastrointestinal: negative for abdominal pain  Objective  Vitals: BP 116/60   Pulse 90   Temp 97.6 F (36.4 C)   Ht '5\' 1"'$  (1.549 m)   Wt 152 lb 3.2 oz (69 kg)   LMP  (LMP Unknown)   SpO2 98%   BMI 28.76 kg/m  General: no acute distress , A&Ox3 HEENT: PEERL, conjunctiva normal, left crusting upper eyelid neck is supple, oropharynx clear, no sinus tenderness Cardiovascular:  RRR without murmur or gallop.  Respiratory:  Good breath sounds bilaterally, CTAB with normal respiratory effort Skin:  Warm, no rashes    Commons side effects, risks, benefits, and alternatives for medications and treatment plan prescribed today were discussed, and the patient expressed understanding of the given instructions. Patient is instructed to call or message via MyChart if he/she has any questions or concerns regarding our treatment plan. No barriers to understanding were identified. We discussed Red Flag symptoms and signs in detail. Patient expressed understanding regarding what to do in case of urgent or emergency type symptoms.  Medication list was reconciled, printed and provided to the patient in AVS. Patient instructions and  summary information was reviewed with the patient as documented in the AVS. This note was prepared with assistance of Dragon voice recognition software. Occasional wrong-word or sound-a-like substitutions may have occurred due to the inherent limitations of voice recognition software  This visit occurred during the SARS-CoV-2 public health emergency.  Safety protocols were in place, including screening questions prior to the visit, additional usage of staff PPE, and extensive cleaning of exam room while observing appropriate contact time as indicated for disinfecting solutions.

## 2022-04-24 NOTE — Patient Instructions (Signed)
Please follow up if symptoms do not improve or as needed.    The antibiotics should help clear your infection.  You may use mucinex to help with the congestion and pressure.

## 2022-04-30 ENCOUNTER — Other Ambulatory Visit: Payer: Self-pay | Admitting: Family Medicine

## 2022-05-17 ENCOUNTER — Other Ambulatory Visit: Payer: Self-pay | Admitting: Family Medicine

## 2022-05-17 DIAGNOSIS — Z1231 Encounter for screening mammogram for malignant neoplasm of breast: Secondary | ICD-10-CM

## 2022-05-18 ENCOUNTER — Encounter: Payer: Self-pay | Admitting: Family Medicine

## 2022-05-18 ENCOUNTER — Ambulatory Visit (INDEPENDENT_AMBULATORY_CARE_PROVIDER_SITE_OTHER): Payer: Medicare HMO | Admitting: Family Medicine

## 2022-05-18 VITALS — BP 122/80 | HR 97 | Temp 97.3°F | Ht 61.0 in | Wt 152.6 lb

## 2022-05-18 DIAGNOSIS — E785 Hyperlipidemia, unspecified: Secondary | ICD-10-CM

## 2022-05-18 DIAGNOSIS — E119 Type 2 diabetes mellitus without complications: Secondary | ICD-10-CM

## 2022-05-18 DIAGNOSIS — I10 Essential (primary) hypertension: Secondary | ICD-10-CM | POA: Diagnosis not present

## 2022-05-18 DIAGNOSIS — R053 Chronic cough: Secondary | ICD-10-CM

## 2022-05-18 DIAGNOSIS — E1169 Type 2 diabetes mellitus with other specified complication: Secondary | ICD-10-CM | POA: Diagnosis not present

## 2022-05-18 MED ORDER — ATORVASTATIN CALCIUM 40 MG PO TABS
40.0000 mg | ORAL_TABLET | Freq: Every day | ORAL | 3 refills | Status: DC
Start: 2022-05-18 — End: 2023-06-14

## 2022-05-18 NOTE — Progress Notes (Signed)
Phone [phone] In person visit   Subjective:   Candice Guerrero is a 71 y.o. year old very pleasant female patient who presents for/with See problem oriented charting Chief Complaint  Patient presents with   Follow-up   Diabetes   Hypertension    Past Medical History-  Patient Active Problem List   Diagnosis Date Noted   Diabetes mellitus type II, controlled, with no complications ([place]) 21/19/4174    Priority: High   Vitamin D deficiency 01/14/2021    Priority: Medium    Tremor 04/30/2020    Priority: Medium    DDD (degenerative disc disease), cervical 01/29/2020    Priority: Medium    Osteoporosis 07/24/2014    Priority: Medium    IBS (irritable bowel syndrome) 07/24/2014    Priority: Medium    Psoriasis 12/13/2010    Priority: Medium    Essential hypertension 12/13/2010    Priority: Medium    Depression with anxiety 12/13/2010    Priority: Medium    Fatty liver 12/13/2010    Priority: Medium    Hyperlipidemia associated with type 2 diabetes mellitus ([place]) 12/13/2010    Priority: Medium    Acute midline low back pain with left-sided sciatica 11/06/2018    Priority: Low   Abnormal mammogram of right breast 07/18/2017    Priority: Low   Allergic rhinitis 07/24/2014    Priority: Low   Obesity (BMI 30-39.9) 12/31/2013    Priority: Low   Benign hematuria 12/13/2010    Priority: Low   GERD (gastroesophageal reflux disease) 12/13/2010    Priority: Low   Hamstring tendinitis of left thigh 12/25/2019    Medications- reviewed and updated Current Outpatient Medications  Medication Sig Dispense Refill   calcium carbonate (OSCAL) 1500 (600 Ca) MG TABS tablet Take by mouth daily.     citalopram (CELEXA) 20 MG tablet TAKE 1 TABLET BY MOUTH EVERY DAY 90 tablet 3   metFORMIN (GLUCOPHAGE-XR) 500 MG 24 hr tablet TAKE 2 TABLETS BY MOUTH IN THE MORNING AND AT BEDTIME EVERY DAY 360 tablet 3   omeprazole (PRILOSEC) 20 MG capsule TAKE 1 CAPSULE BY MOUTH EVERY DAY 90 capsule 3    promethazine (PHENERGAN) 25 MG tablet Take 1 tablet (25 mg total) by mouth every 6 (six) hours as needed for nausea or vomiting. 20 tablet 0   silver sulfADIAZINE (SILVADENE) 1 % cream Apply 1 application topically daily. 50 g 0   triamcinolone cream (KENALOG) 0.1 % APPLY TO AFFECTED AREA TWICE A DAY     valsartan-hydrochlorothiazide (DIOVAN-HCT) 80-12.5 MG tablet TAKE 1 TABLET DAILY 90 tablet 3   atorvastatin (LIPITOR) 40 MG tablet Take 1 tablet (40 mg total) by mouth daily. 90 tablet 3   trimethoprim-polymyxin b (POLYTRIM) ophthalmic solution Place 1 drop into both eyes every 3 (three) hours.     No current facility-administered medications for this visit.     Objective:  BP 122/80   Pulse 97   Temp (!) 97.3 F (36.3 C)   Ht '5\' 1"'$  (1.549 m)   Wt 152 lb 9.6 oz (69.2 kg)   LMP  (LMP Unknown)   SpO2 96%   BMI 28.83 kg/m  Gen: NAD, resting comfortably CV: RRR no murmurs rubs or gallops Lungs: CTAB no crackles, wheeze, rhonchi Ext: no edema Skin: warm, dry  Diabetic Foot Exam - Simple   Simple Foot Form Diabetic Foot exam was performed with the following findings: Yes 05/18/2022  3:59 PM  Visual Inspection Sensation Testing Intact to touch and  monofilament testing bilaterally: Yes Pulse Check Posterior Tibialis and Dorsalis pulse intact bilaterally: Yes Comments Slight callous on bottom of left foot- was a blister that has been healing- she will monitor and let us know if fails to continue to improve        Assessment and Plan   # Chronic cough S:saw Dr. Jonni Sanger on 04/12/22 and again 04/24/22- on the second visit given augmentinf or 10 days which she completed- also was to continue eye drops polymyxin B sulf-trimethoprim- [facility] in case it was bacterial.   Cough at this point for 6 weeks. Feels like something stuck at base of throat. No shortness of breath. Sinus pressure much better A/P: 6 weeks of cough - rule out walking pneumonia with x-ray.  From avs  "IF no pneumonia- may trial prednisone for post viral cough as well as tessalon cough medicine to try to suppress cough- I might even increase omeprazole short term for a month -reasonable to add claritin or allegra before bed in case allergic element"  # Diabetes S: Medication:Metformin 1000 mg extended release in the morning and at night (takes 2 of the '500mg'$  tablets) Exercise and diet- 7-8k steps a day, reasonable food choices - down 3 lbs form last visit Lab Results  Component Value Date   HGBA1C 6.9 (H) 11/18/2021   HGBA1C 6.9 (H) 06/10/2021   HGBA1C 6.9 (H) 01/14/2021   A/P: hopefully stable- update a1c today. Continue current meds for now  #hypertension S: medication: Valsartan hydrochlorothiazide 80-12.5 mg daily Home readings #s: does not check regularly BP Readings from Last 3 Encounters:  05/18/22 122/80  04/24/22 116/60  04/12/22 128/74  A/P: Controlled. Continue current medications.   #hyperlipidemia S: Medication:Atorvastatin 40 mg- removed rosuvastatin from list- appears was refilled by team per protocol  Lab Results  Component Value Date   CHOL 114 11/18/2021   HDL 48 (L) 11/18/2021   LDLCALC 40 11/18/2021   LDLDIRECT 47.0 06/10/2021   TRIG 180 (H) 11/18/2021   CHOLHDL 2.4 11/18/2021   A/P: excellent control- continue current meds  # Depression S: Medication:Citalopram 20 mg -Prior on Cymbalta but not as effective as in the past and had sweatiness in her hands    05/18/2022    2:55 PM 04/24/2022    4:08 PM 11/18/2021    2:21 PM  Depression screen PHQ 2/9  Decreased Interest 0 0 0  Down, Depressed, Hopeless 0 0 0  PHQ - 2 Score 0 0 0  Altered sleeping 1  0  Tired, decreased energy 0  0  Change in appetite 0  0  Feeling bad or failure about yourself  0  0  Trouble concentrating 0  0  Moving slowly or fidgety/restless 0  0  Suicidal thoughts 0  0  PHQ-9 Score 1  0  Difficult doing work/chores Not difficult at all  Not difficult at all  A/P: full  remission- continue current meds   #history sciatica problems- gets tingling down left leg to foot at night- may see Dr. Arnoldo Morale with neurosurgery  Recommended follow up: Return in about 6 months (around 11/18/2022) for physical or sooner if needed.Schedule b4 you leave. Future Appointments  Date Time Provider [place]  06/26/2022  1:45 PM [facility]  07/03/2022 11:30 AM [facility] MM 2 [facility]   Lab/Order associations:   ICD-10-CM   1. Chronic cough  R05.3 DG Chest 2 View    2. Controlled type 2 diabetes mellitus  without complication, without long-term current use of insulin (HCC)  E11.9 CBC with Differential/Platelet    Comprehensive metabolic panel    Hemoglobin A1c    3. Essential hypertension  I10     4. Hyperlipidemia associated with type 2 diabetes mellitus (HCC)  E11.69    E78.5      Meds ordered this encounter  Medications   atorvastatin (LIPITOR) 40 MG tablet    Sig: Take 1 tablet (40 mg total) by mouth daily.    Dispense:  90 tablet    Refill:  3    Please get rid of any rosuvastatin rx- should only be on atorvastatin   Return precautions advised.  [name], MD

## 2022-05-18 NOTE — Patient Instructions (Addendum)
Please go to [facility] (updated 12/11/2019) - located [address] across the [address] from [place] - in the basement - Hours: 8:30-5:00 PM M-F (with lunch from 12:30- 1 PM). You do NOT need an appointment.   IF no pneumonia- may trial prednisone for post viral cough as well as tessalon cough medicine to try to suppress cough- I might even increase omeprazole short term for a month -reasonable to add claritin or allegra before bed in case allergic element  Health Maintenance Due  Topic Date Due   INFLUENZA VACCINE - Flu shot- we should have these available within a month or two but please let us know if you get at outside pharmacy 05/16/2022    Recommended follow up: Return in about 6 months (around 11/18/2022) for physical or sooner if needed.Schedule b4 you leave.

## 2022-05-19 ENCOUNTER — Ambulatory Visit (INDEPENDENT_AMBULATORY_CARE_PROVIDER_SITE_OTHER)
Admission: RE | Admit: 2022-05-19 | Discharge: 2022-05-19 | Disposition: A | Discharge status: Home / Self Care | Payer: Medicare HMO | Source: Ambulatory Visit | Attending: Family Medicine | Admitting: Family Medicine

## 2022-05-19 ENCOUNTER — Other Ambulatory Visit: Payer: Self-pay | Admitting: Family Medicine

## 2022-05-19 DIAGNOSIS — R053 Chronic cough: Secondary | ICD-10-CM

## 2022-05-19 DIAGNOSIS — R059 Cough, unspecified: Secondary | ICD-10-CM | POA: Diagnosis not present

## 2022-05-19 LAB — COMPREHENSIVE METABOLIC PANEL
ALT: 18 U/L (ref 0–35)
AST: 25 U/L (ref 0–37)
Albumin: 4.4 g/dL (ref 3.5–5.2)
Alkaline Phosphatase: 72 U/L (ref 39–117)
BUN: 16 mg/dL (ref 6–23)
CO2: 29 mEq/L (ref 19–32)
Calcium: 9.6 mg/dL (ref 8.4–10.5)
Chloride: 100 mEq/L (ref 96–112)
Creatinine, Ser: 0.92 mg/dL (ref 0.40–1.20)
GFR: 61.89 mL/min (ref >60.00)
Glucose, Bld: 86 mg/dL (ref 70–99)
Potassium: 3.7 mEq/L (ref 3.5–5.1)
Sodium: 139 mEq/L (ref 135–145)
Total Bilirubin: 0.4 mg/dL (ref 0.2–1.2)
Total Protein: 7 g/dL (ref 6.0–8.3)

## 2022-05-19 LAB — CBC WITH DIFFERENTIAL/PLATELET
Basophils Absolute: 0.1 10*3/uL (ref 0.0–0.1)
Basophils Relative: 0.9 % (ref 0.0–3.0)
Eosinophils Absolute: 0.1 10*3/uL (ref 0.0–0.7)
Eosinophils Relative: 1 % (ref 0.0–5.0)
HCT: 40 % (ref 36.0–46.0)
Hemoglobin: 12.5 g/dL (ref 12.0–15.0)
Lymphocytes Relative: 35.3 % (ref 12.0–46.0)
Lymphs Abs: 4.1 10*3/uL — ABNORMAL HIGH (ref 0.7–4.0)
MCHC: 31.3 g/dL (ref 30.0–36.0)
MCV: 81.3 fl (ref 78.0–100.0)
Monocytes Absolute: 1.1 10*3/uL — ABNORMAL HIGH (ref 0.1–1.0)
Monocytes Relative: 9.3 % (ref 3.0–12.0)
Neutro Abs: 6.3 10*3/uL (ref 1.4–7.7)
Neutrophils Relative %: 53.5 % (ref 43.0–77.0)
Platelets: 322 10*3/uL (ref 150.0–400.0)
RBC: 4.92 Mil/uL (ref 3.87–5.11)
RDW: 15.4 % (ref 11.5–15.5)
WBC: 11.7 10*3/uL — ABNORMAL HIGH (ref 4.0–10.5)

## 2022-05-19 LAB — HEMOGLOBIN A1C: Hgb A1c MFr Bld: 7.3 % — ABNORMAL HIGH (ref 4.6–6.5)

## 2022-05-19 MED ORDER — PREDNISONE 20 MG PO TABS: ORAL_TABLET | ORAL | 0 refills | Status: DC

## 2022-05-23 ENCOUNTER — Other Ambulatory Visit: Payer: Self-pay

## 2022-05-23 DIAGNOSIS — D72829 Elevated white blood cell count, unspecified: Secondary | ICD-10-CM

## 2022-06-21 ENCOUNTER — Other Ambulatory Visit: Payer: Medicare HMO

## 2022-06-26 ENCOUNTER — Ambulatory Visit: Payer: Medicare HMO

## 2022-06-29 ENCOUNTER — Other Ambulatory Visit (INDEPENDENT_AMBULATORY_CARE_PROVIDER_SITE_OTHER): Payer: Medicare HMO

## 2022-06-29 DIAGNOSIS — D72829 Elevated white blood cell count, unspecified: Secondary | ICD-10-CM | POA: Diagnosis not present

## 2022-06-29 LAB — CBC WITH DIFFERENTIAL/PLATELET
Basophils Absolute: 0.1 10*3/uL (ref 0.0–0.1)
Basophils Relative: 0.6 % (ref 0.0–3.0)
Eosinophils Absolute: 0.1 10*3/uL (ref 0.0–0.7)
Eosinophils Relative: 0.9 % (ref 0.0–5.0)
HCT: 38.8 % (ref 36.0–46.0)
Hemoglobin: 12.2 g/dL (ref 12.0–15.0)
Lymphocytes Relative: 33.6 % (ref 12.0–46.0)
Lymphs Abs: 2.9 10*3/uL (ref 0.7–4.0)
MCHC: 31.5 g/dL (ref 30.0–36.0)
MCV: 81.3 fl (ref 78.0–100.0)
Monocytes Absolute: 0.7 10*3/uL (ref 0.1–1.0)
Monocytes Relative: 8.7 % (ref 3.0–12.0)
Neutro Abs: 4.8 10*3/uL (ref 1.4–7.7)
Neutrophils Relative %: 56.2 % (ref 43.0–77.0)
Platelets: 340 10*3/uL (ref 150.0–400.0)
RBC: 4.77 Mil/uL (ref 3.87–5.11)
RDW: 16.4 % — ABNORMAL HIGH (ref 11.5–15.5)
WBC: 8.6 10*3/uL (ref 4.0–10.5)

## 2022-07-03 ENCOUNTER — Ambulatory Visit: Payer: Medicare HMO

## 2022-07-10 ENCOUNTER — Encounter: Payer: Self-pay | Admitting: *Deleted

## 2022-07-12 DIAGNOSIS — M67912 Unspecified disorder of synovium and tendon, left shoulder: Secondary | ICD-10-CM | POA: Diagnosis not present

## 2022-07-17 ENCOUNTER — Ambulatory Visit (INDEPENDENT_AMBULATORY_CARE_PROVIDER_SITE_OTHER): Payer: Medicare HMO | Admitting: *Deleted

## 2022-07-17 DIAGNOSIS — Z23 Encounter for immunization: Secondary | ICD-10-CM | POA: Diagnosis not present

## 2022-08-12 ENCOUNTER — Other Ambulatory Visit: Payer: Self-pay | Admitting: Family Medicine

## 2022-08-14 ENCOUNTER — Ambulatory Visit
Admission: RE | Admit: 2022-08-14 | Discharge: 2022-08-14 | Disposition: A | Discharge status: Home / Self Care | Payer: Medicare HMO | Source: Ambulatory Visit | Attending: Family Medicine | Admitting: Family Medicine

## 2022-08-14 DIAGNOSIS — Z1231 Encounter for screening mammogram for malignant neoplasm of breast: Secondary | ICD-10-CM | POA: Diagnosis not present

## 2022-09-04 DIAGNOSIS — Z411 Encounter for cosmetic surgery: Secondary | ICD-10-CM | POA: Diagnosis not present

## 2022-09-04 DIAGNOSIS — L814 Other melanin hyperpigmentation: Secondary | ICD-10-CM | POA: Diagnosis not present

## 2022-09-04 DIAGNOSIS — L821 Other seborrheic keratosis: Secondary | ICD-10-CM | POA: Diagnosis not present

## 2022-09-04 DIAGNOSIS — Z86006 Personal history of melanoma in-situ: Secondary | ICD-10-CM | POA: Diagnosis not present

## 2022-09-04 DIAGNOSIS — L578 Other skin changes due to chronic exposure to nonionizing radiation: Secondary | ICD-10-CM | POA: Diagnosis not present

## 2022-09-04 DIAGNOSIS — D2272 Melanocytic nevi of left lower limb, including hip: Secondary | ICD-10-CM | POA: Diagnosis not present

## 2022-09-04 DIAGNOSIS — L409 Psoriasis, unspecified: Secondary | ICD-10-CM | POA: Diagnosis not present

## 2022-09-04 DIAGNOSIS — D225 Melanocytic nevi of trunk: Secondary | ICD-10-CM | POA: Diagnosis not present

## 2022-10-12 ENCOUNTER — Encounter: Payer: Self-pay | Admitting: Family

## 2022-10-12 ENCOUNTER — Ambulatory Visit (INDEPENDENT_AMBULATORY_CARE_PROVIDER_SITE_OTHER): Payer: Medicare HMO | Admitting: Family

## 2022-10-12 VITALS — BP 147/83 | HR 96 | Temp 97.8°F | Ht 60.0 in | Wt 150.4 lb

## 2022-10-12 DIAGNOSIS — J069 Acute upper respiratory infection, unspecified: Secondary | ICD-10-CM

## 2022-10-12 DIAGNOSIS — J101 Influenza due to other identified influenza virus with other respiratory manifestations: Secondary | ICD-10-CM | POA: Diagnosis not present

## 2022-10-12 LAB — POCT INFLUENZA A/B
Influenza A, POC: POSITIVE — AB
Influenza B, POC: NEGATIVE

## 2022-10-12 LAB — POC COVID19 BINAXNOW: SARS Coronavirus 2 Ag: NEGATIVE

## 2022-10-12 MED ORDER — OSELTAMIVIR PHOSPHATE 75 MG PO CAPS: 75.0000 mg | ORAL_CAPSULE | Freq: Two times a day (BID) | ORAL | 0 refills | Status: DC

## 2022-10-12 NOTE — Progress Notes (Signed)
Patient ID: Candice Guerrero, female    DOB: 1950/11/21, 71 y.o.   MRN: [id number]  Chief Complaint  Patient presents with   Sinus Problem    Pt c/o Dry Cough, body aches, nausea, headache, Nasal/ chest congestion. Present for 2 days. Has tried HTB which did help her cough.     HPI:      URI sx: Pt c/o Dry Cough, body aches, nausea, headache, Nasal/ chest congestion. Present for 2 days. Has tried HTB which did help her cough some. Reports mild fever. Did not test for covid, received flu shot back in October.       Assessment & Plan:  1. Upper respiratory tract infection, unspecified type - rapid covid neg. Advised ok to continue OTC cough syrup and sinus meds. Tylenol or Ibuprofen tid prn for aches, pains.   - POC COVID-19 - POCT Influenza A/B  2. Influenza A (H1N1) - sending Tamiflu, advised on use & SE, pt can continue to also take OTC meds, increase daily fluid intake, eat mild, bland diet for rest of day, dry toast, crackers, broth, etc. Call back if sx are not improving in 2-3d.  - oseltamivir (TAMIFLU) 75 MG capsule; Take 1 capsule (75 mg total) by mouth 2 (two) times daily.  Dispense: 10 capsule; Refill: 0   Subjective:    Outpatient Medications Prior to Visit  Medication Sig Dispense Refill   atorvastatin (LIPITOR) 40 MG tablet Take 1 tablet (40 mg total) by mouth daily. 90 tablet 3   calcium carbonate (OSCAL) 1500 (600 Ca) MG TABS tablet Take by mouth daily.     citalopram (CELEXA) 20 MG tablet TAKE 1 TABLET BY MOUTH EVERY DAY 90 tablet 3   metFORMIN (GLUCOPHAGE-XR) 500 MG 24 hr tablet TAKE 2 TABLETS BY MOUTH IN THE MORNING AND AT BEDTIME EVERY DAY 360 tablet 3   omeprazole (PRILOSEC) 20 MG capsule TAKE 1 CAPSULE BY MOUTH EVERY DAY 90 capsule 3   predniSONE (DELTASONE) 20 MG tablet Take 1 tablet by mouth daily for 5 days, then 1/2 tablet daily for 2 days 6 tablet 0   promethazine (PHENERGAN) 25 MG tablet Take 1 tablet (25 mg total) by mouth every 6 (six) hours as needed for  nausea or vomiting. 20 tablet 0   silver sulfADIAZINE (SILVADENE) 1 % cream Apply 1 application topically daily. 50 g 0   triamcinolone cream (KENALOG) 0.1 % APPLY TO AFFECTED AREA TWICE A DAY     valsartan-hydrochlorothiazide (DIOVAN-HCT) 80-12.5 MG tablet TAKE 1 TABLET DAILY 90 tablet 3   No facility-administered medications prior to visit.   Past Medical History:  Diagnosis Date   Abnormal mammogram of right breast 07/18/2017   Allergy    SEASONAL   Benign hematuria    Cataract    LEFT EYE   Dental crowns present    Depression    Diabetes mellitus    NIDDM   GERD (gastroesophageal reflux disease)    Headache(784.0)    migraines, sinus   Hiatal hernia    Hyperlipidemia    Hypertension    under control, has been on med. since 2005   Osteopenia    Psoriasis    arms, legs, back   Snores    denies sx. of sleep apnea   Past Surgical History:  Procedure Laterality Date   BREAST LUMPECTOMY WITH RADIOACTIVE SEED LOCALIZATION Right 07/18/2017   Procedure: RIGHT BREAST LUMPECTOMY WITH RADIOACTIVE SEED LOCALIZATION;  Surgeon: Fanny Skates, MD;  Location: [facility];  Service: General;  Laterality: Right;   COLONOSCOPY     DILATION AND CURETTAGE OF UTERUS     HARDWARE REMOVAL  07/11/2012   Procedure: HARDWARE REMOVAL;  Surgeon: Cammie Sickle., MD;  Location: [facility];  Service: Orthopedics;  Laterality: Left;  pin removal wire out and debride pisiform   left wrist surgery     July 9th 2013   TONSILLECTOMY     TOOTH EXTRACTION Right 12/13/2016   Procedure: SURGICAL REMOVAL OF ODONTOGENIC TUMOR AND REMOVAL OF IMPACTED TOOTH #1;  Surgeon: Jannette Fogo, DDS;  Location: [facility];  Service: Oral Surgery;  Laterality: Right;   TRIGGER FINGER RELEASE  07/11/2012   Procedure: RELEASE TRIGGER FINGER/A-1 PULLEY;  Surgeon: Cammie Sickle., MD;  Location: [facility];  Service: Orthopedics;  Laterality: Left;  LEFT LONG  FINGER   Allergies  Allergen Reactions   Oxycodone Nausea Only   Altace [Ramipril] Cough      Objective:    Physical Exam Vitals and nursing note reviewed.  Constitutional:      Appearance: Normal appearance. She is ill-appearing.     Interventions: Face mask in place.  HENT:     Right Ear: Tympanic membrane and ear canal normal.     Left Ear: Tympanic membrane and ear canal normal.     Nose:     Right Sinus: No frontal sinus tenderness.     Left Sinus: No frontal sinus tenderness.     Mouth/Throat:     Mouth: Mucous membranes are moist.     Pharynx: Posterior oropharyngeal erythema present. No pharyngeal swelling, oropharyngeal exudate or uvula swelling.     Tonsils: No tonsillar exudate or tonsillar abscesses.  Cardiovascular:     Rate and Rhythm: Normal rate and regular rhythm.  Pulmonary:     Effort: Pulmonary effort is normal.     Breath sounds: Normal breath sounds.  Musculoskeletal:        General: Normal range of motion.  Lymphadenopathy:     Head:     Right side of head: No preauricular or posterior auricular adenopathy.     Left side of head: No preauricular or posterior auricular adenopathy.     Cervical: No cervical adenopathy.  Skin:    General: Skin is warm and dry.  Neurological:     Mental Status: She is alert.  Psychiatric:        Mood and Affect: Mood normal.        Behavior: Behavior normal.    BP (!) 147/83 (BP Location: Left Arm, Patient Position: Sitting, Cuff Size: Large)   Pulse 96   Temp 97.8 F (36.6 C) (Temporal)   Ht 5' (1.524 m)   Wt 150 lb 6.4 oz (68.2 kg)   LMP  (LMP Unknown)   SpO2 97%   BMI 29.37 kg/m  Wt Readings from Last 3 Encounters:  10/12/22 150 lb 6.4 oz (68.2 kg)  05/18/22 152 lb 9.6 oz (69.2 kg)  04/24/22 152 lb 3.2 oz (69 kg)       Jeanie Sewer, NP

## 2022-10-17 ENCOUNTER — Encounter: Payer: Self-pay | Admitting: Family Medicine

## 2022-10-30 ENCOUNTER — Telehealth: Payer: Self-pay | Admitting: Family Medicine

## 2022-10-30 ENCOUNTER — Other Ambulatory Visit: Payer: Self-pay

## 2022-10-30 MED ORDER — VALSARTAN-HYDROCHLOROTHIAZIDE 80-12.5 MG PO TABS: 1.0000 | ORAL_TABLET | Freq: Every day | ORAL | 3 refills | Status: DC

## 2022-10-30 NOTE — Telephone Encounter (Signed)
Rx was sent to preferred pharmacy!

## 2022-10-30 NOTE — Telephone Encounter (Signed)
Patient states she is no longer using [facility] anymore. Patient requests the following be sent asap:    LAST APPOINTMENT DATE: 05/18/22  NEXT APPOINTMENT DATE:  11/20/22  MEDICATION:valsartan-hydrochlorothiazide (DIOVAN-HCT) 80-12.5 MG tablet   Is the patient out of medication? Yes  PHARMACY:  [id number] [place] [place] [address]: [phone] Fax: [phone]     Let patient know to contact pharmacy at the end of the day to make sure medication is ready.  Please notify patient to allow 48-72 hours to process

## 2022-11-06 ENCOUNTER — Ambulatory Visit (INDEPENDENT_AMBULATORY_CARE_PROVIDER_SITE_OTHER): Payer: Medicare HMO | Admitting: Family Medicine

## 2022-11-06 ENCOUNTER — Encounter: Payer: Self-pay | Admitting: Family Medicine

## 2022-11-06 VITALS — BP 142/80 | HR 80 | Temp 98.4°F | Ht 60.0 in | Wt 151.5 lb

## 2022-11-06 DIAGNOSIS — R079 Chest pain, unspecified: Secondary | ICD-10-CM | POA: Diagnosis not present

## 2022-11-06 NOTE — Progress Notes (Signed)
   Acute Office Visit  Subjective:     Patient ID: Candice Guerrero, female    DOB: 06/26/1951, 72 y.o.   MRN: [id number]  Chief Complaint  Patient presents with   Cough    Non-productive x3 weeks, states she was diagnosed with influenza on December 28th   Chest Pain    Patient complains of right-sided chest pain with inhalation since this morning   Ear Pain    Patient complains of right ear pain since last night    Cough Associated symptoms include chest pain.  Chest Pain  Associated symptoms include a cough.   Patient is in today for right ear pain and right sided chest pain. She was recently with Flu A on 10/12/22. Pt reports she was very sick for about 2 weeks, states that she is still having coughing. States that she is having new right ear pain last night and she had some right sided chest pain with breathing this morning. She denies any new fevers or new sputum production. She reports no difficulty breathing or gasping for air. States she is comfortably breathing. She denies any wheezing.  States that she is slowly getting better.  Review of Systems  Respiratory:  Positive for cough.   Cardiovascular:  Positive for chest pain.        Objective:    BP (!) 142/80 (BP Location: Left Arm, Patient Position: Sitting, Cuff Size: Normal)   Pulse 80   Temp 98.4 F (36.9 C) (Oral)   Ht 5' (1.524 m)   Wt 151 lb 8 oz (68.7 kg)   LMP  (LMP Unknown)   SpO2 98%   BMI 29.59 kg/m    Physical Exam Vitals reviewed.  Constitutional:      Appearance: She is well-developed and normal weight.  HENT:     Right Ear: A middle ear effusion is present. Tympanic membrane is not injected or erythematous.     Left Ear: Tympanic membrane normal.  Cardiovascular:     Rate and Rhythm: Normal rate and regular rhythm.  Pulmonary:     Effort: Pulmonary effort is normal.     Breath sounds: No decreased breath sounds, wheezing, rhonchi or rales.  Neurological:     General: No focal deficit  present.     Mental Status: She is alert.     No results found for any visits on 11/06/22.      Assessment & Plan:   Problem List Items Addressed This Visit   None Visit Diagnoses     Right-sided chest pain    -  Primary   Relevant Orders   DG Chest 2 View     Lungs are clear on exam, her right TM does show a middle ear effusion however there is no TM bulging or erythema. I recommended getting a CXR to rule out secondary bacterial pneumonia. Patient was Lynnell Grain that if she has any shortness of breath or difficulty breathing she should go straight to the ER. Pt voiced understanding.   No orders of the defined types were placed in this encounter.   No follow-ups on file.  Farrel Conners, MD

## 2022-11-09 ENCOUNTER — Encounter: Payer: Self-pay | Admitting: Family Medicine

## 2022-11-09 NOTE — Telephone Encounter (Signed)
Ok to cancel the order for the chest x ray

## 2022-11-20 ENCOUNTER — Encounter: Payer: Self-pay | Admitting: Family Medicine

## 2022-11-20 ENCOUNTER — Ambulatory Visit (INDEPENDENT_AMBULATORY_CARE_PROVIDER_SITE_OTHER): Payer: Medicare HMO | Admitting: Family Medicine

## 2022-11-20 VITALS — BP 128/78 | HR 84 | Temp 97.7°F | Ht 61.0 in | Wt 153.2 lb

## 2022-11-20 DIAGNOSIS — E785 Hyperlipidemia, unspecified: Secondary | ICD-10-CM

## 2022-11-20 DIAGNOSIS — E559 Vitamin D deficiency, unspecified: Secondary | ICD-10-CM | POA: Diagnosis not present

## 2022-11-20 DIAGNOSIS — I10 Essential (primary) hypertension: Secondary | ICD-10-CM | POA: Diagnosis not present

## 2022-11-20 DIAGNOSIS — Z0000 Encounter for general adult medical examination without abnormal findings: Secondary | ICD-10-CM | POA: Diagnosis not present

## 2022-11-20 DIAGNOSIS — E1169 Type 2 diabetes mellitus with other specified complication: Secondary | ICD-10-CM | POA: Diagnosis not present

## 2022-11-20 DIAGNOSIS — R231 Pallor: Secondary | ICD-10-CM | POA: Diagnosis not present

## 2022-11-20 DIAGNOSIS — E119 Type 2 diabetes mellitus without complications: Secondary | ICD-10-CM

## 2022-11-20 LAB — LIPID PANEL
Cholesterol: 158 mg/dL (ref 0–200)
HDL: 46.3 mg/dL (ref >39.00)
NonHDL: 111.89
Total CHOL/HDL Ratio: 3
Triglycerides: 216 mg/dL — ABNORMAL HIGH (ref 0.0–149.0)
VLDL: 43.2 mg/dL — ABNORMAL HIGH (ref 0.0–40.0)

## 2022-11-20 LAB — CBC WITH DIFFERENTIAL/PLATELET
Basophils Absolute: 0.1 10*3/uL (ref 0.0–0.1)
Basophils Relative: 0.5 % (ref 0.0–3.0)
Eosinophils Absolute: 0.1 10*3/uL (ref 0.0–0.7)
Eosinophils Relative: 0.9 % (ref 0.0–5.0)
HCT: 40.6 % (ref 36.0–46.0)
Hemoglobin: 12.9 g/dL (ref 12.0–15.0)
Lymphocytes Relative: 43.1 % (ref 12.0–46.0)
Lymphs Abs: 5.8 10*3/uL — ABNORMAL HIGH (ref 0.7–4.0)
MCHC: 31.9 g/dL (ref 30.0–36.0)
MCV: 81.9 fl (ref 78.0–100.0)
Monocytes Absolute: 1.5 10*3/uL — ABNORMAL HIGH (ref 0.1–1.0)
Monocytes Relative: 10.9 % (ref 3.0–12.0)
Neutro Abs: 6 10*3/uL (ref 1.4–7.7)
Neutrophils Relative %: 44.6 % (ref 43.0–77.0)
Platelets: 412 10*3/uL — ABNORMAL HIGH (ref 150.0–400.0)
RBC: 4.96 Mil/uL (ref 3.87–5.11)
RDW: 15.4 % (ref 11.5–15.5)
WBC: 13.5 10*3/uL — ABNORMAL HIGH (ref 4.0–10.5)

## 2022-11-20 LAB — MICROALBUMIN / CREATININE URINE RATIO
Creatinine,U: 84.9 mg/dL
Microalb Creat Ratio: 6.6 mg/g (ref 0.0–30.0)
Microalb, Ur: 5.6 mg/dL — ABNORMAL HIGH (ref 0.0–1.9)

## 2022-11-20 LAB — LDL CHOLESTEROL, DIRECT: Direct LDL: 90 mg/dL

## 2022-11-20 LAB — COMPREHENSIVE METABOLIC PANEL
ALT: 25 U/L (ref 0–35)
AST: 23 U/L (ref 0–37)
Albumin: 4.6 g/dL (ref 3.5–5.2)
Alkaline Phosphatase: 107 U/L (ref 39–117)
BUN: 20 mg/dL (ref 6–23)
CO2: 26 mEq/L (ref 19–32)
Calcium: 10.1 mg/dL (ref 8.4–10.5)
Chloride: 98 mEq/L (ref 96–112)
Creatinine, Ser: 0.75 mg/dL (ref 0.40–1.20)
GFR: 78.8 mL/min (ref >60.00)
Glucose, Bld: 97 mg/dL (ref 70–99)
Potassium: 3.9 mEq/L (ref 3.5–5.1)
Sodium: 138 mEq/L (ref 135–145)
Total Bilirubin: 0.5 mg/dL (ref 0.2–1.2)
Total Protein: 7.4 g/dL (ref 6.0–8.3)

## 2022-11-20 LAB — GLUCOSE, POCT (MANUAL RESULT ENTRY): POC Glucose: 81 mg/dl (ref 70–99)

## 2022-11-20 LAB — HEMOGLOBIN A1C: Hgb A1c MFr Bld: 6.7 % — ABNORMAL HIGH (ref 4.6–6.5)

## 2022-11-20 LAB — VITAMIN D 25 HYDROXY (VIT D DEFICIENCY, FRACTURES): VITD: 32.63 ng/mL (ref 30.00–100.00)

## 2022-11-20 MED ORDER — BLOOD GLUCOSE TEST VI STRP: 1.0000 | ORAL_STRIP | Freq: Three times a day (TID) | 0 refills | Status: DC

## 2022-11-20 MED ORDER — LANCETS MISC. MISC: 1.0000 | Freq: Three times a day (TID) | 0 refills | Status: AC

## 2022-11-20 MED ORDER — LANCET DEVICE MISC: 1.0000 | Freq: Three times a day (TID) | 0 refills | Status: AC

## 2022-11-20 MED ORDER — BLOOD GLUCOSE MONITORING SUPPL DEVI: 1.0000 | Freq: Three times a day (TID) | 0 refills | Status: DC

## 2022-11-20 NOTE — Progress Notes (Signed)
Phone [phone]   Subjective:  Patient presents today for their annual physical. Chief complaint-noted.   See problem oriented charting- ROS- full  review of systems was completed and negative except for: cough, congestion, sinus pressure, some fatiuge with illness  The following were reviewed and entered/updated in epic: Past Medical History:  Diagnosis Date   Abnormal mammogram of right breast 07/18/2017   Allergy    SEASONAL   Benign hematuria    Cataract    LEFT EYE   Dental crowns present    Depression    Diabetes mellitus    NIDDM   GERD (gastroesophageal reflux disease)    Headache(784.0)    migraines, sinus   Hiatal hernia    Hyperlipidemia    Hypertension    under control, has been on med. since 2005   Osteopenia    Psoriasis    arms, legs, back   Snores    denies sx. of sleep apnea   Patient Active Problem List   Diagnosis Date Noted   Diabetes mellitus type II, controlled, with no complications ([place]) 94/17/4081    Priority: High   Vitamin D deficiency 01/14/2021    Priority: Medium    Tremor 04/30/2020    Priority: Medium    DDD (degenerative disc disease), cervical 01/29/2020    Priority: Medium    Osteoporosis 07/24/2014    Priority: Medium    IBS (irritable bowel syndrome) 07/24/2014    Priority: Medium    Psoriasis 12/13/2010    Priority: Medium    Essential hypertension 12/13/2010    Priority: Medium    Depression with anxiety 12/13/2010    Priority: Medium    Fatty liver 12/13/2010    Priority: Medium    Hyperlipidemia associated with type 2 diabetes mellitus ([place]) 12/13/2010    Priority: Medium    Acute midline low back pain with left-sided sciatica 11/06/2018    Priority: Low   Abnormal mammogram of right breast 07/18/2017    Priority: Low   Allergic rhinitis 07/24/2014    Priority: Low   Obesity (BMI 30-39.9) 12/31/2013    Priority: Low   Benign hematuria 12/13/2010    Priority: Low   GERD (gastroesophageal reflux disease)  12/13/2010    Priority: Low   Hamstring tendinitis of left thigh 12/25/2019   Past Surgical History:  Procedure Laterality Date   BREAST LUMPECTOMY WITH RADIOACTIVE SEED LOCALIZATION Right 07/18/2017   Procedure: RIGHT BREAST LUMPECTOMY WITH RADIOACTIVE SEED LOCALIZATION;  Surgeon: Fanny Skates, MD;  Location: [facility];  Service: General;  Laterality: Right;   [place] REMOVAL  07/11/2012   Procedure: HARDWARE REMOVAL;  Surgeon: Cammie Sickle., MD;  Location: [facility];  Service: Orthopedics;  Laterality: Left;  pin removal wire out and debride pisiform   left wrist surgery     July 9th 2013   TONSILLECTOMY     TOOTH EXTRACTION Right 12/13/2016   Procedure: SURGICAL REMOVAL OF ODONTOGENIC TUMOR AND REMOVAL OF IMPACTED TOOTH #1;  Surgeon: Jannette Fogo, DDS;  Location: [facility];  Service: Oral Surgery;  Laterality: Right;   TRIGGER FINGER RELEASE  07/11/2012   Procedure: RELEASE TRIGGER FINGER/A-1 PULLEY;  Surgeon: Cammie Sickle., MD;  Location: [facility];  Service: Orthopedics;  Laterality: Left;  LEFT LONG FINGER    Family History  Problem Relation Age of Onset   Hyperlipidemia Mother  Hypertension Mother    Tremor Mother    Hypertension Father    Kidney disease Father    Renal Disease Father    Anesthesia problems Sister        post-op N/V, hard to wake up post-op   Diabetes Maternal Uncle    High blood pressure Brother    Prostate cancer Brother    Healthy Son    Healthy Son     Medications- reviewed and updated Current Outpatient Medications  Medication Sig Dispense Refill   atorvastatin (LIPITOR) 40 MG tablet Take 1 tablet (40 mg total) by mouth daily. 90 tablet 3   Blood Glucose Monitoring Suppl DEVI 1 each by Does not apply route in the morning, at noon, and at bedtime. May substitute to any manufacturer covered by patient's insurance. 1 each  0   calcium carbonate (OSCAL) 1500 (600 Ca) MG TABS tablet Take by mouth daily.     Cholecalciferol (VITAMIN D-3 PO) Take 2,000 Units by mouth daily.     citalopram (CELEXA) 20 MG tablet TAKE 1 TABLET BY MOUTH EVERY DAY 90 tablet 3   Cyanocobalamin (B-12 PO) Take 1,000 mcg by mouth daily.     Glucose Blood (BLOOD GLUCOSE TEST STRIPS) STRP 1 each by In Vitro route in the morning, at noon, and at bedtime. May substitute to any manufacturer covered by patient's insurance. 100 strip 0   Lancet Device MISC 1 each by Does not apply route in the morning, at noon, and at bedtime. May substitute to any manufacturer covered by patient's insurance. 1 each 0   Lancets Misc. MISC 1 each by Does not apply route in the morning, at noon, and at bedtime. May substitute to any manufacturer covered by patient's insurance. 100 each 0   MAGNESIUM PO Take 800 mg by mouth daily.     metFORMIN (GLUCOPHAGE-XR) 500 MG 24 hr tablet TAKE 2 TABLETS BY MOUTH IN THE MORNING AND AT BEDTIME EVERY DAY 360 tablet 3   omeprazole (PRILOSEC) 20 MG capsule TAKE 1 CAPSULE BY MOUTH EVERY DAY 90 capsule 3   silver sulfADIAZINE (SILVADENE) 1 % cream Apply 1 application topically daily. 50 g 0   triamcinolone cream (KENALOG) 0.1 % APPLY TO AFFECTED AREA TWICE A DAY     valsartan-hydrochlorothiazide (DIOVAN-HCT) 80-12.5 MG tablet Take 1 tablet by mouth daily. 90 tablet 3   No current facility-administered medications for this visit.    Allergies-reviewed and updated Allergies  Allergen Reactions   Oxycodone Nausea Only   Altace [Ramipril] Cough    Social History   Social History Narrative   Moved to [place] in 2011 from [place] due to more affordable.       Married 1972 Mortimer Fries). 2 kids. 5 grandkids. 2 in [place], 3 [place].       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       Hobbies: babysitting, casino      Left handed   Objective  Objective:  BP 128/78    Pulse 84   Temp 97.7 F (36.5 C)   Ht '5\' 1"'$  (1.549 m)   Wt 153 lb 3.2 oz (69.5 kg)   LMP  (LMP Unknown)   SpO2 95%   BMI 28.95 kg/m  Gen: NAD, resting comfortably HEENT: Mucous membranes are moist. Oropharynx normal Neck: no thyromegaly CV: RRR no murmurs rubs or gallops Lungs: CTAB no crackles, wheeze, rhonchi Abdomen: soft/nontender/nondistended/normal bowel sounds. No rebound  or guarding.  Ext: no edema Skin: warm, dry Neuro: grossly normal, moves all extremities, PERRLA   Assessment and Plan   72 y.o. female presenting for annual physical.  Health Maintenance counseling: 1. Anticipatory guidance: Patient counseled regarding regular dental exams -q6 months, eye exams - yearly,  avoiding smoking and second hand smoke , limiting alcohol to 1 beverage per day- occasionally if goes out to restaurant or goes to friends house , no illicit drugs .   2. Risk factor reduction:  Advised patient of need for regular exercise and diet rich and fruits and vegetables to reduce risk of heart attack and stroke.  Exercise- walking 4 days a week for the most part.  Diet/weight management-weight stable through holidays. Reasonably healthy diet other than still loves to cheat on potato chips Wt Readings from Last 3 Encounters:  11/20/22 153 lb 3.2 oz (69.5 kg)  11/06/22 151 lb 8 oz (68.7 kg)  10/12/22 150 lb 6.4 oz (68.2 kg)  3. Immunizations/screenings/ancillary studies- declines covid shot Immunization History  Administered Date(s) Administered   Fluad Quad(high Dose 65+) 08/17/2020, 07/17/2022   Influenza Split 08/09/2011, 08/13/2012   Influenza, High Dose Seasonal PF 08/17/2016, 07/30/2017, 07/23/2018, 07/23/2019, 07/18/2021   Influenza,inj,Quad PF,6+ Mos 06/30/2013, 07/24/2014, 08/09/2015   Influenza-Unspecified 07/23/2019   PFIZER(Purple Top)SARS-COV-2 Vaccination 11/07/2019, 11/28/2019, 10/25/2020   Pneumococcal Conjugate-13 12/31/2013   Pneumococcal Polysaccharide-23 01/26/2015   Tdap  10/16/2002, 02/24/2013   Zoster Recombinat (Shingrix) 03/14/2017, 05/21/2017   Zoster, Live 12/13/2010  4. Cervical cancer screening- Dr. Landry Mellow gyn- still has pelvic exams q 2 years 5. Breast cancer screening-  breast exam with Dr. Landry Mellow and mammogram 08/14/22 6. Colon cancer screening - 04/30/14 with 10 year repeat 7. Skin cancer screening- dermatology q6 months. advised regular sunscreen use. Denies worrisome, changing, or new skin lesions.  8. Birth control/STD check- postmenopausal and monogamous  9. Osteoporosis screening at 49- gyn follows. Acotnel in past over 5 years- worst t score in 2022 at -1.5- has been doing well. Walks for weight bearing exercise. Takes citrical and extra D 10. Smoking associated screening - former smoker- quit 24 years ago  Status of chronic or acute concerns   #chronic cough- 6 weeks of courgh at visit in August. No pneumonia on cxr and we trialed prednisone- patient reprorts did well and symptoms worsend.   -then had influenza A  12.28.21 with cough and has had ongoing cough since that time although feeling better otherwise. Some fatigue. Some frontal sinus pressure. Fair amount of clear discharge in the morning.  -did have some right sided chest pain about 2 weeks ago- thankfully that resolved- she ended up not getting chest x-ray that was ordered and since impreoved cancel.  -still getting some triggers that cause cough in day and everyday still getting am sinus pressure with discharge.  - she would like to try nasal lavage first but I told her with chronicity of cough and congestion and sinus pressure if not improving or worsens I will send in augmentin antibiotic for her -for likely secondary bacterial sinusitis. No regular screening needed  # Diabetes S: Medication: metformin '500mg'$  - takes 2 twice daily  CBGs- no recent checks Lab Results  Component Value Date   HGBA1C 7.3 (H) 05/18/2022   HGBA1C 6.9 (H) 11/18/2021   HGBA1C 6.9 (H) 06/10/2021   A/P:  hopefully stable or improved- update a1c today. Continue current meds for now- she would be open to rybelsus but prefers to avoid injections  #hypertension S: medication:  valsartan-hydrochlorothiazide 80-12.5 mg BP Readings from Last 3 Encounters:  11/20/22 128/78  11/06/22 (!) 142/80  10/12/22 (!) 147/83  A/P: stable- continue current medicines   #hyperlipidemia S: Medication:atorvastatin 40 mg  Lab Results  Component Value Date   CHOL 114 11/18/2021   HDL 48 (L) 11/18/2021   LDLCALC 40 11/18/2021   LDLDIRECT 47.0 06/10/2021   TRIG 180 (H) 11/18/2021   CHOLHDL 2.4 11/18/2021   A/P: hopefully stable- update lipid panel today. Continue current meds for now  # Depression S: Medication:citalopram '20mg'$      10/12/2022   11:20 AM 05/18/2022    2:55 PM 04/24/2022    4:08 PM  Depression screen PHQ 2/9  Decreased Interest 0 0 0  Down, Depressed, Hopeless 0 0 0  PHQ - 2 Score 0 0 0  Altered sleeping 0 1   Tired, decreased energy 0 0   Change in appetite 0 0   Feeling bad or failure about yourself  0 0   Trouble concentrating 0 0   Moving slowly or fidgety/restless 0 0   Suicidal thoughts 0 0   PHQ-9 Score 0 1   Difficult doing work/chores Not difficult at all Not difficult at all   A/P: full remission- continue current medications   #Vitamin D deficiency S: Medication: Takes citrical and extra D Last vitamin D Lab Results  Component Value Date   VD25OH 90 11/18/2021   A/P: hopefully stable- update vitmain D today. Continue current meds for now   Recommended follow up: Return in about 6 months (around 05/21/2023) for followup or sooner if needed.Schedule b4 you leave.  Lab/Order associations:NOT fasting- cereal and coffee 9 am. Sugar 81 during visit was feeling clammy. Gave 100 calorie snack during visit with water.    ICD-10-CM   1. Preventative health care  Z00.00     2. Controlled type 2 diabetes mellitus without complication, without long-term current use of insulin  (HCC)  E11.9 Microalbumin / creatinine urine ratio    CBC with Differential/Platelet    Comprehensive metabolic panel    Lipid panel    Hemoglobin A1c    Blood Glucose Monitoring Suppl DEVI    Glucose Blood (BLOOD GLUCOSE TEST STRIPS) STRP    Lancet Device MISC    Lancets Misc. MISC    3. Essential hypertension  I10     4. Hyperlipidemia associated with type 2 diabetes mellitus ([place])  E11.69    E78.5     5. Vitamin D deficiency  E55.9 VITAMIN D 25 Hydroxy (Vit-D Deficiency, Fractures)      Meds ordered this encounter  Medications   Blood Glucose Monitoring Suppl DEVI    Sig: 1 each by Does not apply route in the morning, at noon, and at bedtime. May substitute to any manufacturer covered by patient's insurance.    Dispense:  1 each    Refill:  0    E11.9   Glucose Blood (BLOOD GLUCOSE TEST STRIPS) STRP    Sig: 1 each by In Vitro route in the morning, at noon, and at bedtime. May substitute to any manufacturer covered by patient's insurance.    Dispense:  100 strip    Refill:  0   Lancet Device MISC    Sig: 1 each by Does not apply route in the morning, at noon, and at bedtime. May substitute to any manufacturer covered by patient's insurance.    Dispense:  1 each    Refill:  0   Lancets Misc. [place]  Sig: 1 each by Does not apply route in the morning, at noon, and at bedtime. May substitute to any manufacturer covered by patient's insurance.    Dispense:  100 each    Refill:  0    Return precautions advised.  [name], MD

## 2022-11-20 NOTE — Patient Instructions (Addendum)
-   she would like to try nasal lavage first but I told her with chronicity of cough and congestion and sinus pressure if not improving or worsens I will send in augmentin antibiotic for her -for likely secondary bacterial sinusitis  Please stop by lab before you go If you have mychart- we will send your results within 3 business days of [place] receiving them.  If you do not have mychart- we will call you about results within 5 business days of [place] receiving them.  *please also note that you will see labs on mychart as soon as they post. I will later go in and write notes on them- will say "notes from Dr. Yong Channel"    Recommended follow up: Return in about 6 months (around 05/21/2023) for followup or sooner if needed.Schedule b4 you leave. 4 months if a1c is above 7

## 2022-12-09 ENCOUNTER — Other Ambulatory Visit: Payer: Self-pay | Admitting: Family Medicine

## 2022-12-09 DIAGNOSIS — E119 Type 2 diabetes mellitus without complications: Secondary | ICD-10-CM

## 2022-12-21 ENCOUNTER — Other Ambulatory Visit: Payer: Self-pay | Admitting: Family Medicine

## 2022-12-21 DIAGNOSIS — E119 Type 2 diabetes mellitus without complications: Secondary | ICD-10-CM

## 2023-01-21 ENCOUNTER — Other Ambulatory Visit: Payer: Self-pay | Admitting: Family Medicine

## 2023-01-21 DIAGNOSIS — E119 Type 2 diabetes mellitus without complications: Secondary | ICD-10-CM

## 2023-01-24 ENCOUNTER — Telehealth: Payer: Self-pay | Admitting: Family Medicine

## 2023-01-24 NOTE — Telephone Encounter (Signed)
Contacted E[facility] A [place] to schedule their annual wellness visit. Call back at later date: 02/13/2023  Gabriel Cirri [facility] AWV TEAM Direct Dial [phone]

## 2023-02-02 DIAGNOSIS — S0081XA Abrasion of other part of head, initial encounter: Secondary | ICD-10-CM | POA: Diagnosis not present

## 2023-02-02 DIAGNOSIS — M25511 Pain in right shoulder: Secondary | ICD-10-CM | POA: Diagnosis not present

## 2023-02-02 DIAGNOSIS — S4991XA Unspecified injury of right shoulder and upper arm, initial encounter: Secondary | ICD-10-CM | POA: Diagnosis not present

## 2023-02-02 DIAGNOSIS — S299XXA Unspecified injury of thorax, initial encounter: Secondary | ICD-10-CM | POA: Diagnosis not present

## 2023-02-02 DIAGNOSIS — S0990XA Unspecified injury of head, initial encounter: Secondary | ICD-10-CM | POA: Diagnosis not present

## 2023-02-02 DIAGNOSIS — S99912A Unspecified injury of left ankle, initial encounter: Secondary | ICD-10-CM | POA: Diagnosis not present

## 2023-02-02 DIAGNOSIS — R0781 Pleurodynia: Secondary | ICD-10-CM | POA: Diagnosis not present

## 2023-02-02 DIAGNOSIS — W1839XA Other fall on same level, initial encounter: Secondary | ICD-10-CM | POA: Diagnosis not present

## 2023-02-02 DIAGNOSIS — S8262XA Displaced fracture of lateral malleolus of left fibula, initial encounter for closed fracture: Secondary | ICD-10-CM | POA: Diagnosis not present

## 2023-02-02 DIAGNOSIS — M7989 Other specified soft tissue disorders: Secondary | ICD-10-CM | POA: Diagnosis not present

## 2023-02-02 DIAGNOSIS — S82892A Other fracture of left lower leg, initial encounter for closed fracture: Secondary | ICD-10-CM | POA: Diagnosis not present

## 2023-02-20 DIAGNOSIS — M25572 Pain in left ankle and joints of left foot: Secondary | ICD-10-CM | POA: Diagnosis not present

## 2023-02-21 ENCOUNTER — Telehealth: Payer: Self-pay | Admitting: Family Medicine

## 2023-02-21 NOTE — Telephone Encounter (Signed)
Copied from CRM [id number]. Topic: Medicare AWV >> Feb 21, 2023  9:22 AM Gwenith Spitz wrote: Reason for CRM: Called patient to schedule Medicare Annual Wellness Visit (AWV). Left message for patient to call back and schedule Medicare Annual Wellness Visit (AWV).  Last date of AWV: 06/06/2021  Please schedule AWVS with Inetta Fermo, [facility].  If any questions, please contact me at [phone].  Thank you ,  Gabriel Cirri [facility] AWV TEAM Direct Dial [phone]

## 2023-02-28 DIAGNOSIS — D225 Melanocytic nevi of trunk: Secondary | ICD-10-CM | POA: Diagnosis not present

## 2023-02-28 DIAGNOSIS — L821 Other seborrheic keratosis: Secondary | ICD-10-CM | POA: Diagnosis not present

## 2023-02-28 DIAGNOSIS — L814 Other melanin hyperpigmentation: Secondary | ICD-10-CM | POA: Diagnosis not present

## 2023-02-28 DIAGNOSIS — L578 Other skin changes due to chronic exposure to nonionizing radiation: Secondary | ICD-10-CM | POA: Diagnosis not present

## 2023-02-28 DIAGNOSIS — D2272 Melanocytic nevi of left lower limb, including hip: Secondary | ICD-10-CM | POA: Diagnosis not present

## 2023-02-28 DIAGNOSIS — Z86006 Personal history of melanoma in-situ: Secondary | ICD-10-CM | POA: Diagnosis not present

## 2023-02-28 DIAGNOSIS — L409 Psoriasis, unspecified: Secondary | ICD-10-CM | POA: Diagnosis not present

## 2023-03-14 DIAGNOSIS — M25572 Pain in left ankle and joints of left foot: Secondary | ICD-10-CM | POA: Diagnosis not present

## 2023-03-14 DIAGNOSIS — M67911 Unspecified disorder of synovium and tendon, right shoulder: Secondary | ICD-10-CM | POA: Diagnosis not present

## 2023-04-04 DIAGNOSIS — M25572 Pain in left ankle and joints of left foot: Secondary | ICD-10-CM | POA: Diagnosis not present

## 2023-04-12 DIAGNOSIS — H25813 Combined forms of age-related cataract, bilateral: Secondary | ICD-10-CM | POA: Diagnosis not present

## 2023-04-12 DIAGNOSIS — H5213 Myopia, bilateral: Secondary | ICD-10-CM | POA: Diagnosis not present

## 2023-04-12 DIAGNOSIS — H52203 Unspecified astigmatism, bilateral: Secondary | ICD-10-CM | POA: Diagnosis not present

## 2023-04-12 DIAGNOSIS — E119 Type 2 diabetes mellitus without complications: Secondary | ICD-10-CM | POA: Diagnosis not present

## 2023-04-12 LAB — HM DIABETES EYE EXAM

## 2023-04-13 ENCOUNTER — Other Ambulatory Visit: Payer: Self-pay | Admitting: Family Medicine

## 2023-04-23 DIAGNOSIS — S93402A Sprain of unspecified ligament of left ankle, initial encounter: Secondary | ICD-10-CM | POA: Diagnosis not present

## 2023-04-23 DIAGNOSIS — M25572 Pain in left ankle and joints of left foot: Secondary | ICD-10-CM | POA: Diagnosis not present

## 2023-04-30 DIAGNOSIS — S93402D Sprain of unspecified ligament of left ankle, subsequent encounter: Secondary | ICD-10-CM | POA: Diagnosis not present

## 2023-05-02 DIAGNOSIS — S93402D Sprain of unspecified ligament of left ankle, subsequent encounter: Secondary | ICD-10-CM | POA: Diagnosis not present

## 2023-05-16 DIAGNOSIS — M25572 Pain in left ankle and joints of left foot: Secondary | ICD-10-CM | POA: Diagnosis not present

## 2023-05-16 DIAGNOSIS — S93402D Sprain of unspecified ligament of left ankle, subsequent encounter: Secondary | ICD-10-CM | POA: Diagnosis not present

## 2023-05-18 DIAGNOSIS — S93402D Sprain of unspecified ligament of left ankle, subsequent encounter: Secondary | ICD-10-CM | POA: Diagnosis not present

## 2023-05-21 ENCOUNTER — Encounter: Payer: Self-pay | Admitting: Family Medicine

## 2023-05-21 ENCOUNTER — Ambulatory Visit (INDEPENDENT_AMBULATORY_CARE_PROVIDER_SITE_OTHER): Payer: Medicare HMO | Admitting: Family Medicine

## 2023-05-21 VITALS — BP 122/72 | Temp 97.3°F | Ht 61.0 in | Wt 150.6 lb

## 2023-05-21 DIAGNOSIS — E1169 Type 2 diabetes mellitus with other specified complication: Secondary | ICD-10-CM

## 2023-05-21 DIAGNOSIS — E119 Type 2 diabetes mellitus without complications: Secondary | ICD-10-CM

## 2023-05-21 DIAGNOSIS — F418 Other specified anxiety disorders: Secondary | ICD-10-CM

## 2023-05-21 DIAGNOSIS — I10 Essential (primary) hypertension: Secondary | ICD-10-CM | POA: Diagnosis not present

## 2023-05-21 DIAGNOSIS — E785 Hyperlipidemia, unspecified: Secondary | ICD-10-CM | POA: Diagnosis not present

## 2023-05-21 DIAGNOSIS — Z7984 Long term (current) use of oral hypoglycemic drugs: Secondary | ICD-10-CM

## 2023-05-21 LAB — COMPREHENSIVE METABOLIC PANEL
ALT: 30 U/L (ref 0–35)
AST: 32 U/L (ref 0–37)
Albumin: 4.3 g/dL (ref 3.5–5.2)
Alkaline Phosphatase: 106 U/L (ref 39–117)
BUN: 15 mg/dL (ref 6–23)
CO2: 26 mEq/L (ref 19–32)
Calcium: 9.8 mg/dL (ref 8.4–10.5)
Chloride: 101 mEq/L (ref 96–112)
Creatinine, Ser: 0.8 mg/dL (ref 0.40–1.20)
GFR: 72.67 mL/min (ref >60.00)
Glucose, Bld: 102 mg/dL — ABNORMAL HIGH (ref 70–99)
Potassium: 3.7 mEq/L (ref 3.5–5.1)
Sodium: 141 mEq/L (ref 135–145)
Total Bilirubin: 0.4 mg/dL (ref 0.2–1.2)
Total Protein: 7.3 g/dL (ref 6.0–8.3)

## 2023-05-21 LAB — CBC WITH DIFFERENTIAL/PLATELET
Basophils Absolute: 0.1 10*3/uL (ref 0.0–0.1)
Basophils Relative: 0.8 % (ref 0.0–3.0)
Eosinophils Absolute: 0.1 10*3/uL (ref 0.0–0.7)
Eosinophils Relative: 0.9 % (ref 0.0–5.0)
HCT: 39.8 % (ref 36.0–46.0)
Hemoglobin: 12.3 g/dL (ref 12.0–15.0)
Lymphocytes Relative: 33.2 % (ref 12.0–46.0)
Lymphs Abs: 3.6 10*3/uL (ref 0.7–4.0)
MCHC: 31 g/dL (ref 30.0–36.0)
MCV: 81.4 fl (ref 78.0–100.0)
Monocytes Absolute: 1.2 10*3/uL — ABNORMAL HIGH (ref 0.1–1.0)
Monocytes Relative: 10.6 % (ref 3.0–12.0)
Neutro Abs: 6 10*3/uL (ref 1.4–7.7)
Neutrophils Relative %: 54.5 % (ref 43.0–77.0)
Platelets: 429 10*3/uL — ABNORMAL HIGH (ref 150.0–400.0)
RBC: 4.89 Mil/uL (ref 3.87–5.11)
RDW: 15.2 % (ref 11.5–15.5)
WBC: 11 10*3/uL — ABNORMAL HIGH (ref 4.0–10.5)

## 2023-05-21 LAB — HEMOGLOBIN A1C: Hgb A1c MFr Bld: 6.9 % — ABNORMAL HIGH (ref 4.6–6.5)

## 2023-05-21 NOTE — Progress Notes (Signed)
Phone [phone] In person visit   Subjective:   Candice Guerrero is a 71 y.o. year old very pleasant female patient who presents for/with See problem oriented charting Chief Complaint  Patient presents with   Medical Management of Chronic Issues   Hypertension   Diabetes   Past Medical History-  Patient Active Problem List   Diagnosis Date Noted   Diabetes mellitus type II, controlled, with no complications (HCC) 12/13/2010    Priority: High   Vitamin D deficiency 01/14/2021    Priority: Medium    Tremor 04/30/2020    Priority: Medium    DDD (degenerative disc disease), cervical 01/29/2020    Priority: Medium    Osteoporosis 07/24/2014    Priority: Medium    IBS (irritable bowel syndrome) 07/24/2014    Priority: Medium    Psoriasis 12/13/2010    Priority: Medium    Essential hypertension 12/13/2010    Priority: Medium    Depression with anxiety 12/13/2010    Priority: Medium    Fatty liver 12/13/2010    Priority: Medium    Hyperlipidemia associated with type 2 diabetes mellitus (HCC) 12/13/2010    Priority: Medium    Acute midline low back pain with left-sided sciatica 11/06/2018    Priority: Low   Abnormal mammogram of right breast 07/18/2017    Priority: Low   Allergic rhinitis 07/24/2014    Priority: Low   Obesity (BMI 30-39.9) 12/31/2013    Priority: Low   Benign hematuria 12/13/2010    Priority: Low   GERD (gastroesophageal reflux disease) 12/13/2010    Priority: Low   Hamstring tendinitis of left thigh 12/25/2019    Medications- reviewed and updated Current Outpatient Medications  Medication Sig Dispense Refill   atorvastatin (LIPITOR) 40 MG tablet Take 1 tablet (40 mg total) by mouth daily. 90 tablet 3   Blood Glucose Monitoring Suppl (ONE TOUCH ULTRA 2) w/Device KIT USE AS DIRECTED 3 TIMES A DAY (MORNING, NOON, AND BEDTIME) 1 kit 3   calcium carbonate (OSCAL) 1500 (600 Ca) MG TABS tablet Take by mouth daily.     Cholecalciferol (VITAMIN D-3 PO)  Take 2,000 Units by mouth daily.     citalopram (CELEXA) 20 MG tablet TAKE 1 TABLET BY MOUTH EVERY DAY 90 tablet 3   Cyanocobalamin (B-12 PO) Take 1,000 mcg by mouth daily.     MAGNESIUM PO Take 800 mg by mouth daily.     metFORMIN (GLUCOPHAGE-XR) 500 MG 24 hr tablet TAKE 2 TABLETS BY MOUTH IN THE MORNING AND AT BEDTIME EVERY DAY 360 tablet 3   omeprazole (PRILOSEC) 20 MG capsule TAKE 1 CAPSULE BY MOUTH EVERY DAY 90 capsule 3   ONETOUCH ULTRA test strip USE AS DIRECTED IN THE MORNING, AT NOON, AND AT BEDTIME 100 strip 0   silver sulfADIAZINE (SILVADENE) 1 % cream Apply 1 application topically daily. 50 g 0   triamcinolone cream (KENALOG) 0.1 % APPLY TO AFFECTED AREA TWICE A DAY     valsartan-hydrochlorothiazide (DIOVAN-HCT) 80-12.5 MG tablet Take 1 tablet by mouth daily. 90 tablet 3   No current facility-administered medications for this visit.     Objective:  BP 122/72   Temp (!) 97.3 F (36.3 C)   Ht 5\' 1"  (1.549 m)   Wt 150 lb 9.6 oz (68.3 kg)   LMP  (LMP Unknown)   SpO2 100%   BMI 28.46 kg/m  Gen: NAD, resting comfortably CV: RRR no murmurs rubs or gallops Lungs: CTAB no crackles, wheeze, rhonchi Ext:  no edema Skin: warm, dry Wearing brace on left foot- wortking with ortho    Assessment and Plan   #left ankle fracture - has bene working with Dr. Althea Charon since April (cracked rib as well, sprained shoulder, lump on head- had CT scan). Was doing better and had largely healed then went to a [place] in [place] about 3 weeks ago and was on uneven terrain and had another fall- healing from this ankle fracture now -has started taking k2 along with her calcium and vitamin D -working with physical therapy to help avoid falls -plans to update DEXA with gynecology when sees them in next few months  #Hearing aid- started with this and has been helpful   #COVID 19- had about 3 weeks ago but has healed well- mild lingering nasal congestion- baseline chronic cough is actually better than  last visit - some lingering particularly in the morning sinus congestion- may trial Flonase  # Diabetes S: Medication: metformin 500 mg 2 twice daily Lab Results  Component Value Date   HGBA1C 6.7 (H) 11/20/2022   HGBA1C 7.3 (H) 05/18/2022   HGBA1C 6.9 (H) 11/18/2021  A/P: hopefully stable- update a1c today. Continue current meds for now   #hypertension S: medication: valsartan hydrochlorothiazide 80-12.5 mg BP Readings from Last 3 Encounters:  05/21/23 122/72  11/20/22 128/78  11/06/22 (!) 142/80  A/P: stable- continue current medicines   #hyperlipidemia S: Medication: atorvastatin 40 mg Lab Results  Component Value Date   CHOL 158 11/20/2022   HDL 46.30 11/20/2022   LDLCALC 40 11/18/2021   LDLDIRECT 90.0 11/20/2022   TRIG 216.0 (H) 11/20/2022   CHOLHDL 3 11/20/2022   A/P: not perfect but at least reasonable control- continue current medications with LDL at least under 100  # Depression S: Medication: citalopram 20 mg    05/21/2023    2:35 PM 11/20/2022    1:40 PM 10/12/2022   11:20 AM  Depression screen PHQ 2/9  Decreased Interest 0 0 0  Down, Depressed, Hopeless 0 0 0  PHQ - 2 Score 0 0 0  Altered sleeping 0 0 0  Tired, decreased energy 1 1 0  Change in appetite 0 0 0  Feeling bad or failure about yourself  0 0 0  Trouble concentrating 0 0 0  Moving slowly or fidgety/restless 0 0 0  Suicidal thoughts 0 0 0  PHQ-9 Score 1 1 0  Difficult doing work/chores Not difficult at all Not difficult at all Not difficult at all  A/P: full remission- continue current medications but she has done so well on this dose for so long that we opted to trial 10 mg -she will split her 20 mg in half at home and if this works well we can send in a 10 mg in the interim or at next visit  #health maintenance- declines COVID and flu for now- will get flu later. Advised Tetanus, Diphtheria, and Pertussis (Tdap).  Immunization History  Administered Date(s) Administered   Fluad Quad(high Dose  65+) 08/17/2020, 07/17/2022   Influenza Split 08/09/2011, 08/13/2012   Influenza, High Dose Seasonal PF 08/17/2016, 07/30/2017, 07/23/2018, 07/23/2019, 07/18/2021   Influenza,inj,Quad PF,6+ Mos 06/30/2013, 07/24/2014, 08/09/2015   Influenza-Unspecified 07/23/2019   PFIZER(Purple Top)SARS-COV-2 Vaccination 11/07/2019, 11/28/2019, 10/25/2020   Pneumococcal Conjugate-13 12/31/2013   Pneumococcal Polysaccharide-23 01/26/2015   Tdap 10/16/2002, 02/24/2013   Zoster Recombinant(Shingrix) 03/14/2017, 05/21/2017   Zoster, Live 12/13/2010   Recommended follow up: Return in about 6 months (around 11/21/2023) for physical or sooner if needed.Schedule  b4 you leave.  Lab/Order associations:   ICD-10-CM   1. Controlled type 2 diabetes mellitus without complication, without long-term current use of insulin (HCC)  E11.9 Comprehensive metabolic panel    CBC with Differential/Platelet    Hemoglobin A1c    2. Essential hypertension  I10 Comprehensive metabolic panel    CBC with Differential/Platelet    Hemoglobin A1c    3. Hyperlipidemia associated with type 2 diabetes mellitus (HCC)  E11.69    E78.5     4. Depression with anxiety  F41.8       No orders of the defined types were placed in this encounter.   Return precautions advised.  Tana Conch, MD

## 2023-05-21 NOTE — Patient Instructions (Addendum)
Let us know if you get your Tetanus, Diphtheria, and Pertussis (Tdap) or  flu or COVID vaccine this fall. Team please decline COVID and flu shot for now   - some lingering particularly in the morning sinus congestion- may trial Flonase  Please stop by lab before you go If you have mychart- we will send your results within 3 business days of [place] receiving them.  If you do not have mychart- we will call you about results within 5 business days of [place] receiving them.  *please also note that you will see labs on mychart as soon as they post. I will later go in and write notes on them- will say "notes from Dr. Durene Cal"   Recommended follow up: Return in about 6 months (around 11/21/2023) for physical or sooner if needed.Schedule b4 you leave.

## 2023-05-21 NOTE — Assessment & Plan Note (Signed)
S: Medication: citalopram 20 mg    11/20/2022    1:40 PM 10/12/2022   11:20 AM 05/18/2022    2:55 PM  Depression screen PHQ 2/9  Decreased Interest 0 0 0  Down, Depressed, Hopeless 0 0 0  PHQ - 2 Score 0 0 0  Altered sleeping 0 0 1  Tired, decreased energy 1 0 0  Change in appetite 0 0 0  Feeling bad or failure about yourself  0 0 0  Trouble concentrating 0 0 0  Moving slowly or fidgety/restless 0 0 0  Suicidal thoughts 0 0 0  PHQ-9 Score 1 0 1  Difficult doing work/chores Not difficult at all Not difficult at all Not difficult at all  A/P: full remission- continue current medications but she has done so well on this dose for so long that we opted to trial 10 mg -she will split her 20 mg in half at home and if this works well we can send in a 10 mg in the interim or at next visit

## 2023-05-22 DIAGNOSIS — S93402D Sprain of unspecified ligament of left ankle, subsequent encounter: Secondary | ICD-10-CM | POA: Diagnosis not present

## 2023-05-24 DIAGNOSIS — S93402D Sprain of unspecified ligament of left ankle, subsequent encounter: Secondary | ICD-10-CM | POA: Diagnosis not present

## 2023-05-29 DIAGNOSIS — S93402D Sprain of unspecified ligament of left ankle, subsequent encounter: Secondary | ICD-10-CM | POA: Diagnosis not present

## 2023-05-31 DIAGNOSIS — S93402D Sprain of unspecified ligament of left ankle, subsequent encounter: Secondary | ICD-10-CM | POA: Diagnosis not present

## 2023-06-05 DIAGNOSIS — S93402D Sprain of unspecified ligament of left ankle, subsequent encounter: Secondary | ICD-10-CM | POA: Diagnosis not present

## 2023-06-07 DIAGNOSIS — S93402D Sprain of unspecified ligament of left ankle, subsequent encounter: Secondary | ICD-10-CM | POA: Diagnosis not present

## 2023-06-11 ENCOUNTER — Other Ambulatory Visit: Payer: Self-pay | Admitting: Family Medicine

## 2023-06-11 DIAGNOSIS — S93402D Sprain of unspecified ligament of left ankle, subsequent encounter: Secondary | ICD-10-CM | POA: Diagnosis not present

## 2023-06-13 DIAGNOSIS — S93402D Sprain of unspecified ligament of left ankle, subsequent encounter: Secondary | ICD-10-CM | POA: Diagnosis not present

## 2023-06-13 NOTE — Telephone Encounter (Signed)
Pt needs a refill and both of theses RX. Please advise.

## 2023-07-03 ENCOUNTER — Other Ambulatory Visit: Payer: Self-pay | Admitting: Family Medicine

## 2023-07-03 DIAGNOSIS — Z1231 Encounter for screening mammogram for malignant neoplasm of breast: Secondary | ICD-10-CM

## 2023-07-04 ENCOUNTER — Other Ambulatory Visit: Payer: Self-pay | Admitting: Family Medicine

## 2023-07-04 DIAGNOSIS — S82892A Other fracture of left lower leg, initial encounter for closed fracture: Secondary | ICD-10-CM

## 2023-07-18 ENCOUNTER — Ambulatory Visit
Admission: RE | Admit: 2023-07-18 | Discharge: 2023-07-18 | Disposition: A | Discharge status: Home / Self Care | Payer: Medicare HMO | Source: Ambulatory Visit | Attending: Family Medicine | Admitting: Family Medicine

## 2023-07-18 DIAGNOSIS — S8255XD Nondisplaced fracture of medial malleolus of left tibia, subsequent encounter for closed fracture with routine healing: Secondary | ICD-10-CM | POA: Diagnosis not present

## 2023-07-18 DIAGNOSIS — M19072 Primary osteoarthritis, left ankle and foot: Secondary | ICD-10-CM | POA: Diagnosis not present

## 2023-07-18 DIAGNOSIS — S82892A Other fracture of left lower leg, initial encounter for closed fracture: Secondary | ICD-10-CM

## 2023-07-31 ENCOUNTER — Ambulatory Visit (INDEPENDENT_AMBULATORY_CARE_PROVIDER_SITE_OTHER): Payer: Medicare HMO

## 2023-07-31 DIAGNOSIS — Z23 Encounter for immunization: Secondary | ICD-10-CM | POA: Diagnosis not present

## 2023-07-31 NOTE — Progress Notes (Deleted)
Patient is in office today for a nurse visit for Flu Immunization, per PCP's order. Patient Injection was given in the  Left deltoid. Patient tolerated injection well.

## 2023-08-09 ENCOUNTER — Other Ambulatory Visit: Payer: Self-pay | Admitting: Family Medicine

## 2023-08-13 DIAGNOSIS — Z01419 Encounter for gynecological examination (general) (routine) without abnormal findings: Secondary | ICD-10-CM | POA: Diagnosis not present

## 2023-08-16 ENCOUNTER — Ambulatory Visit
Admission: RE | Admit: 2023-08-16 | Discharge: 2023-08-16 | Disposition: A | Discharge status: Home / Self Care | Payer: Medicare HMO | Source: Ambulatory Visit | Attending: Family Medicine | Admitting: Family Medicine

## 2023-08-16 DIAGNOSIS — Z1231 Encounter for screening mammogram for malignant neoplasm of breast: Secondary | ICD-10-CM | POA: Diagnosis not present

## 2023-09-17 DIAGNOSIS — Z86006 Personal history of melanoma in-situ: Secondary | ICD-10-CM | POA: Diagnosis not present

## 2023-09-17 DIAGNOSIS — D2272 Melanocytic nevi of left lower limb, including hip: Secondary | ICD-10-CM | POA: Diagnosis not present

## 2023-09-17 DIAGNOSIS — D485 Neoplasm of uncertain behavior of skin: Secondary | ICD-10-CM | POA: Diagnosis not present

## 2023-09-17 DIAGNOSIS — L409 Psoriasis, unspecified: Secondary | ICD-10-CM | POA: Diagnosis not present

## 2023-09-17 DIAGNOSIS — L578 Other skin changes due to chronic exposure to nonionizing radiation: Secondary | ICD-10-CM | POA: Diagnosis not present

## 2023-09-17 DIAGNOSIS — L814 Other melanin hyperpigmentation: Secondary | ICD-10-CM | POA: Diagnosis not present

## 2023-09-17 DIAGNOSIS — L821 Other seborrheic keratosis: Secondary | ICD-10-CM | POA: Diagnosis not present

## 2023-09-17 DIAGNOSIS — D225 Melanocytic nevi of trunk: Secondary | ICD-10-CM | POA: Diagnosis not present

## 2023-10-05 ENCOUNTER — Other Ambulatory Visit: Payer: Self-pay | Admitting: Family Medicine

## 2023-10-26 ENCOUNTER — Ambulatory Visit (INDEPENDENT_AMBULATORY_CARE_PROVIDER_SITE_OTHER): Payer: Medicare HMO | Admitting: Physician Assistant

## 2023-10-26 ENCOUNTER — Encounter: Payer: Self-pay | Admitting: Physician Assistant

## 2023-10-26 VITALS — BP 124/72 | HR 105 | Temp 98.2°F | Ht 61.0 in | Wt 149.4 lb

## 2023-10-26 DIAGNOSIS — R051 Acute cough: Secondary | ICD-10-CM | POA: Diagnosis not present

## 2023-10-26 LAB — POCT INFLUENZA A/B
Influenza A, POC: NEGATIVE
Influenza B, POC: NEGATIVE

## 2023-10-26 MED ORDER — LORATADINE 10 MG PO TABS: 10.0000 mg | ORAL_TABLET | Freq: Every day | ORAL | 0 refills | Status: DC

## 2023-10-26 MED ORDER — PROMETHAZINE-DM 6.25-15 MG/5ML PO SYRP: 5.0000 mL | ORAL_SOLUTION | Freq: Two times a day (BID) | ORAL | 0 refills | Status: AC

## 2023-10-26 NOTE — Progress Notes (Signed)
 Patient ID: Candice Guerrero, female    DOB: January 23, 1951, 73 y.o.   MRN: [id number]   Assessment & Plan:  Acute cough -     POCT Influenza A/B -     POC COVID-19 BinaxNow  Other orders -     Promethazine -DM; Take 5 mLs by mouth 2 (two) times daily for 5 days. Take for cough.  Dispense: 120 mL; Refill: 0 -     Loratadine ; Take 1 tablet (10 mg total) by mouth daily.  Dispense: 30 tablet; Refill: 0   Assessment and Plan    Upper Respiratory Infection Persistent cough and congestion since Tuesday, with a history of prolonged cough with previous illnesses. No fever, shortness of breath, or chest pain. Negative for flu and COVID. -Prescribe Promethazine -Codeine  cough syrup, take as directed. -Advise use of nasal saline. -Prescribe Claritin  to help dry up postnasal drip. -Check-in on Monday for progress. If no improvement, consider starting antibiotics. -Very low threshold for urgent or ER this weekend if any acute changes.      No follow-ups on file.    Subjective:    Chief Complaint  Patient presents with   Cough    Pt in office c/o sinus congestion and cough with drainage; symptoms started Tuesday but not ran a fever; ribs hurting from coughing so much;     HPI Discussed the use of AI scribe software for clinical note transcription with the patient, who gave verbal consent to proceed.  History of Present Illness   The patient, a controlled diabetic, presents with a persistent cough and congestion that started a few days ago after a flight from [place] . She reports that she often gets sick after flights. The cough has been keeping her awake for the past three nights and is associated with chest discomfort, likely due to the persistent coughing. She has been taking Coricidin, a medication for diabetics that does not raise blood pressure, but reports that it has not been effective. The cough sounds loose, but she is unable to bring up any mucus. She has a history of similar  episodes, with a cough persisting for weeks. She had the flu last December and reports that when she gets a cough, it tends to linger. She has tried various treatments in the past, including 'little pearls' and a codeine  cough syrup, with varying degrees of success. She has not had any recent exposure to sick individuals, but was around children during her trip to [place] . She has no known lung conditions. Denies any chest pain or SOB. Denies any fever or chills. No direct known sick contacts. No body aches or pain.       Past Medical History:  Diagnosis Date   Abnormal mammogram of right breast 07/18/2017   Allergy    SEASONAL   Benign hematuria    Cataract    LEFT EYE   Dental crowns present    Depression    Diabetes mellitus    NIDDM   GERD (gastroesophageal reflux disease)    Headache(784.0)    migraines, sinus   Hiatal hernia    Hyperlipidemia    Hypertension    under control, has been on med. since 2005   Osteopenia    Psoriasis    arms, legs, back   Snores    denies sx. of sleep apnea    Past Surgical History:  Procedure Laterality Date   BREAST LUMPECTOMY WITH RADIOACTIVE SEED LOCALIZATION Right 07/18/2017   Procedure: RIGHT BREAST LUMPECTOMY WITH RADIOACTIVE  SEED LOCALIZATION;  Surgeon: Gail Favorite, MD;  Location: [facility];  Service: General;  Laterality: Right;   COLONOSCOPY     DILATION AND CURETTAGE OF UTERUS     HARDWARE REMOVAL  07/11/2012   Procedure: HARDWARE REMOVAL;  Surgeon: Lamar LULLA Leonor Mickey., MD;  Location: [facility];  Service: Orthopedics;  Laterality: Left;  pin removal wire out and debride pisiform   left wrist surgery     July 9th 2013   TONSILLECTOMY     TOOTH EXTRACTION Right 12/13/2016   Procedure: SURGICAL REMOVAL OF ODONTOGENIC TUMOR AND REMOVAL OF IMPACTED TOOTH #1;  Surgeon: Ezzie Pinal, DDS;  Location: [facility];  Service: Oral Surgery;  Laterality: Right;   TRIGGER FINGER RELEASE  07/11/2012    Procedure: RELEASE TRIGGER FINGER/A-1 PULLEY;  Surgeon: Lamar LULLA Leonor Mickey., MD;  Location: [facility];  Service: Orthopedics;  Laterality: Left;  LEFT LONG FINGER    Family History  Problem Relation Age of Onset   Hyperlipidemia Mother    Hypertension Mother    Tremor Mother    Hypertension Father    Kidney disease Father    Renal Disease Father    Anesthesia problems Sister        post-op N/V, hard to wake up post-op   Diabetes Maternal Uncle    High blood pressure Brother    Prostate cancer Brother    Healthy Son    Healthy Son     Social History   Tobacco Use   Smoking status: Former    Current packs/day: 0.00    Average packs/day: 0.1 packs/day for 20.0 years (2.0 ttl pk-yrs)    Types: Cigarettes    Start date: 10/16/1978    Quit date: 10/16/1998    Years since quitting: 25.0   Smokeless tobacco: Never   Tobacco comments:    quit smoking in 2000  Vaping Use   Vaping status: Never Used  Substance Use Topics   Alcohol use: Yes    Comment: socially, weekends   Drug use: No     Allergies  Allergen Reactions   Ramipril Cough and Other (See Comments)   Oxycodone  Nausea Only   Oxycodone -Acetaminophen  Nausea And Vomiting   Rabeprazole Other (See Comments)    Review of Systems NEGATIVE UNLESS OTHERWISE INDICATED IN HPI      Objective:     BP 124/72 (BP Location: Left Arm, Patient Position: Sitting)   Pulse (!) 105 Comment: patient persistent recurrent coughing in office  Temp 98.2 F (36.8 C) (Temporal)   Ht 5' 1 (1.549 m)   Wt 149 lb 6.4 oz (67.8 kg)   LMP  (LMP Unknown)   SpO2 97%   BMI 28.23 kg/m   Wt Readings from Last 3 Encounters:  10/26/23 149 lb 6.4 oz (67.8 kg)  05/21/23 150 lb 9.6 oz (68.3 kg)  11/20/22 153 lb 3.2 oz (69.5 kg)    BP Readings from Last 3 Encounters:  10/26/23 124/72  05/21/23 122/72  11/20/22 128/78     Physical Exam Vitals and nursing note reviewed.  Constitutional:      General: She is not in  acute distress.    Appearance: Normal appearance. She is not ill-appearing.  HENT:     Head: Normocephalic.     Right Ear: Tympanic membrane, ear canal and external ear normal.     Left Ear: Tympanic membrane, ear canal and external ear normal.     Nose: Congestion present.  Mouth/Throat:     Mouth: Mucous membranes are moist.     Pharynx: No oropharyngeal exudate or posterior oropharyngeal erythema.  Eyes:     Extraocular Movements: Extraocular movements intact.     Conjunctiva/sclera: Conjunctivae normal.     Pupils: Pupils are equal, round, and reactive to light.  Cardiovascular:     Rate and Rhythm: Normal rate and regular rhythm.     Pulses: Normal pulses.     Heart sounds: Normal heart sounds. No murmur heard. Pulmonary:     Effort: Pulmonary effort is normal. No respiratory distress.     Breath sounds: Normal breath sounds. No wheezing.     Comments: Frequent harsh, dry cough Musculoskeletal:     Cervical back: Normal range of motion.  Skin:    General: Skin is warm.  Neurological:     Mental Status: She is alert and oriented to person, place, and time.  Psychiatric:        Mood and Affect: Mood normal.        Behavior: Behavior normal.        Emya Picado M Tameyah Koch, PA-C

## 2023-10-29 ENCOUNTER — Other Ambulatory Visit: Payer: Self-pay | Admitting: Physician Assistant

## 2023-10-29 ENCOUNTER — Encounter: Payer: Self-pay | Admitting: Physician Assistant

## 2023-10-29 LAB — POC COVID19 BINAXNOW: SARS Coronavirus 2 Ag: NEGATIVE

## 2023-10-29 MED ORDER — AMOXICILLIN-POT CLAVULANATE 875-125 MG PO TABS: 1.0000 | ORAL_TABLET | Freq: Two times a day (BID) | ORAL | 0 refills | Status: AC

## 2023-10-29 MED ORDER — ALBUTEROL SULFATE HFA 108 (90 BASE) MCG/ACT IN AERS: 2.0000 | INHALATION_SPRAY | Freq: Four times a day (QID) | RESPIRATORY_TRACT | 0 refills | Status: DC | PRN

## 2023-10-30 NOTE — Telephone Encounter (Signed)
 Please see pt response to previous message and advise

## 2023-11-17 ENCOUNTER — Other Ambulatory Visit: Payer: Self-pay | Admitting: Physician Assistant

## 2023-11-20 ENCOUNTER — Other Ambulatory Visit: Payer: Self-pay | Admitting: Physician Assistant

## 2023-12-05 ENCOUNTER — Encounter: Payer: Medicare HMO | Admitting: Family Medicine

## 2023-12-26 ENCOUNTER — Ambulatory Visit: Payer: Medicare HMO | Admitting: Family Medicine

## 2023-12-26 ENCOUNTER — Encounter: Payer: Self-pay | Admitting: Family Medicine

## 2023-12-26 VITALS — BP 132/70 | HR 74 | Temp 98.0°F | Ht 61.0 in | Wt 148.0 lb

## 2023-12-26 DIAGNOSIS — E1169 Type 2 diabetes mellitus with other specified complication: Secondary | ICD-10-CM

## 2023-12-26 DIAGNOSIS — E559 Vitamin D deficiency, unspecified: Secondary | ICD-10-CM

## 2023-12-26 DIAGNOSIS — M810 Age-related osteoporosis without current pathological fracture: Secondary | ICD-10-CM | POA: Diagnosis not present

## 2023-12-26 DIAGNOSIS — Z0000 Encounter for general adult medical examination without abnormal findings: Secondary | ICD-10-CM

## 2023-12-26 DIAGNOSIS — I10 Essential (primary) hypertension: Secondary | ICD-10-CM

## 2023-12-26 DIAGNOSIS — E119 Type 2 diabetes mellitus without complications: Secondary | ICD-10-CM

## 2023-12-26 DIAGNOSIS — E785 Hyperlipidemia, unspecified: Secondary | ICD-10-CM | POA: Diagnosis not present

## 2023-12-26 DIAGNOSIS — Z7984 Long term (current) use of oral hypoglycemic drugs: Secondary | ICD-10-CM | POA: Diagnosis not present

## 2023-12-26 LAB — CBC WITH DIFFERENTIAL/PLATELET
Basophils Absolute: 0.1 10*3/uL (ref 0.0–0.1)
Basophils Relative: 0.7 % (ref 0.0–3.0)
Eosinophils Absolute: 0.1 10*3/uL (ref 0.0–0.7)
Eosinophils Relative: 1.1 % (ref 0.0–5.0)
HCT: 36.8 % (ref 36.0–46.0)
Hemoglobin: 11.7 g/dL — ABNORMAL LOW (ref 12.0–15.0)
Lymphocytes Relative: 43.8 % (ref 12.0–46.0)
Lymphs Abs: 3.5 10*3/uL (ref 0.7–4.0)
MCHC: 31.8 g/dL (ref 30.0–36.0)
MCV: 80.3 fl (ref 78.0–100.0)
Monocytes Absolute: 0.7 10*3/uL (ref 0.1–1.0)
Monocytes Relative: 8.8 % (ref 3.0–12.0)
Neutro Abs: 3.6 10*3/uL (ref 1.4–7.7)
Neutrophils Relative %: 45.6 % (ref 43.0–77.0)
Platelets: 389 10*3/uL (ref 150.0–400.0)
RBC: 4.58 Mil/uL (ref 3.87–5.11)
RDW: 15.7 % — ABNORMAL HIGH (ref 11.5–15.5)
WBC: 7.9 10*3/uL (ref 4.0–10.5)

## 2023-12-26 LAB — COMPREHENSIVE METABOLIC PANEL
ALT: 24 U/L (ref 0–35)
AST: 28 U/L (ref 0–37)
Albumin: 4.3 g/dL (ref 3.5–5.2)
Alkaline Phosphatase: 99 U/L (ref 39–117)
BUN: 14 mg/dL (ref 6–23)
CO2: 30 meq/L (ref 19–32)
Calcium: 9.7 mg/dL (ref 8.4–10.5)
Chloride: 101 meq/L (ref 96–112)
Creatinine, Ser: 0.67 mg/dL (ref 0.40–1.20)
GFR: 85.84 mL/min (ref >60.00)
Glucose, Bld: 95 mg/dL (ref 70–99)
Potassium: 4 meq/L (ref 3.5–5.1)
Sodium: 139 meq/L (ref 135–145)
Total Bilirubin: 0.5 mg/dL (ref 0.2–1.2)
Total Protein: 7.2 g/dL (ref 6.0–8.3)

## 2023-12-26 LAB — LIPID PANEL
Cholesterol: 145 mg/dL (ref 0–200)
HDL: 43.1 mg/dL (ref >39.00)
LDL Cholesterol: 73 mg/dL (ref 0–99)
NonHDL: 101.93
Total CHOL/HDL Ratio: 3
Triglycerides: 145 mg/dL (ref 0.0–149.0)
VLDL: 29 mg/dL (ref 0.0–40.0)

## 2023-12-26 LAB — MICROALBUMIN / CREATININE URINE RATIO
Creatinine,U: 84.1 mg/dL
Microalb Creat Ratio: 15.5 mg/g (ref 0.0–30.0)
Microalb, Ur: 1.3 mg/dL (ref 0.0–1.9)

## 2023-12-26 LAB — VITAMIN D 25 HYDROXY (VIT D DEFICIENCY, FRACTURES): VITD: 58.67 ng/mL (ref 30.00–100.00)

## 2023-12-26 LAB — HEMOGLOBIN A1C: Hgb A1c MFr Bld: 6.8 % — ABNORMAL HIGH (ref 4.6–6.5)

## 2023-12-26 NOTE — Progress Notes (Signed)
 Phone [phone]   Subjective:  Patient presents today for their annual physical. Chief complaint-noted.   See problem oriented charting- ROS- full  review of systems was completed and negative except for: dental issues on azithromycin- was told to hold citalopram by pharmacist due to qt prolongation risk  The following were reviewed and entered/updated in epic: Past Medical History:  Diagnosis Date   Abnormal mammogram of right breast 07/18/2017   Allergy    SEASONAL   Benign hematuria    Cataract    LEFT EYE   Dental crowns present    Depression    Diabetes mellitus    NIDDM   GERD (gastroesophageal reflux disease)    Headache(784.0)    migraines, sinus   Hiatal hernia    Hyperlipidemia    Hypertension    under control, has been on med. since 2005   Osteopenia    Psoriasis    arms, legs, back   Snores    denies sx. of sleep apnea   Patient Active Problem List   Diagnosis Date Noted   Diabetes mellitus type II, controlled, with no complications (HCC) 12/13/2010    Priority: High   Vitamin D deficiency 01/14/2021    Priority: Medium    Tremor 04/30/2020    Priority: Medium    DDD (degenerative disc disease), cervical 01/29/2020    Priority: Medium    Osteoporosis 07/24/2014    Priority: Medium    IBS (irritable bowel syndrome) 07/24/2014    Priority: Medium    Psoriasis 12/13/2010    Priority: Medium    Essential hypertension 12/13/2010    Priority: Medium    Depression with anxiety 12/13/2010    Priority: Medium    Fatty liver 12/13/2010    Priority: Medium    Hyperlipidemia associated with type 2 diabetes mellitus (HCC) 12/13/2010    Priority: Medium    Acute midline low back pain with left-sided sciatica 11/06/2018    Priority: Low   Abnormal mammogram of right breast 07/18/2017    Priority: Low   Allergic rhinitis 07/24/2014    Priority: Low   Obesity (BMI 30-39.9) 12/31/2013    Priority: Low   Benign hematuria 12/13/2010    Priority: Low    GERD (gastroesophageal reflux disease) 12/13/2010    Priority: Low   Hamstring tendinitis of left thigh 12/25/2019   Past Surgical History:  Procedure Laterality Date   BREAST LUMPECTOMY WITH RADIOACTIVE SEED LOCALIZATION Right 07/18/2017   Procedure: RIGHT BREAST LUMPECTOMY WITH RADIOACTIVE SEED LOCALIZATION;  Surgeon: Claud Kelp, MD;  Location: [facility];  Service: General;  Laterality: Right;   COLONOSCOPY     DILATION AND CURETTAGE OF UTERUS     HARDWARE REMOVAL  07/11/2012   Procedure: HARDWARE REMOVAL;  Surgeon: Wyn Forster., MD;  Location: [facility];  Service: Orthopedics;  Laterality: Left;  pin removal wire out and debride pisiform   left wrist surgery     July 9th 2013   TONSILLECTOMY     TOOTH EXTRACTION Right 12/13/2016   Procedure: SURGICAL REMOVAL OF ODONTOGENIC TUMOR AND REMOVAL OF IMPACTED TOOTH #1;  Surgeon: Felton Clinton, DDS;  Location: [facility];  Service: Oral Surgery;  Laterality: Right;   TRIGGER FINGER RELEASE  07/11/2012   Procedure: RELEASE TRIGGER FINGER/A-1 PULLEY;  Surgeon: Wyn Forster., MD;  Location: [facility];  Service: Orthopedics;  Laterality: Left;  LEFT LONG FINGER    Family History  Problem Relation Age  of Onset   Hyperlipidemia Mother    Hypertension Mother    Tremor Mother    Hypertension Father    Kidney disease Father    Renal Disease Father    Anesthesia problems Sister        post-op N/V, hard to wake up post-op   Diabetes Maternal Uncle    High blood pressure Brother    Prostate cancer Brother    Healthy Son    Healthy Son     Medications- reviewed and updated Current Outpatient Medications  Medication Sig Dispense Refill   albuterol (VENTOLIN HFA) 108 (90 Base) MCG/ACT inhaler TAKE 2 PUFFS BY MOUTH EVERY 6 HOURS AS NEEDED FOR WHEEZE OR SHORTNESS OF BREATH 18 each 1   atorvastatin (LIPITOR) 40 MG tablet TAKE 1 TABLET BY MOUTH EVERY DAY 90 tablet 3   Blood  Glucose Monitoring Suppl (ONE TOUCH ULTRA 2) w/Device KIT USE AS DIRECTED 3 TIMES A DAY (MORNING, NOON, AND BEDTIME) 1 kit 3   calcium carbonate (OSCAL) 1500 (600 Ca) MG TABS tablet Take by mouth daily.     Cholecalciferol (VITAMIN D-3 PO) Take 2,000 Units by mouth daily.     citalopram (CELEXA) 20 MG tablet TAKE 1 TABLET BY MOUTH EVERY DAY 90 tablet 3   Cyanocobalamin (B-12 PO) Take 1,000 mcg by mouth daily.     loratadine (CLARITIN) 10 MG tablet TAKE 1 TABLET BY MOUTH EVERY DAY 90 tablet 1   MAGNESIUM PO Take 800 mg by mouth daily.     metFORMIN (GLUCOPHAGE-XR) 500 MG 24 hr tablet TAKE 2 TABLETS BY MOUTH IN THE MORNING AND AT BEDTIME EVERY DAY 360 tablet 3   omeprazole (PRILOSEC) 20 MG capsule TAKE 1 CAPSULE BY MOUTH EVERY DAY 90 capsule 3   ONETOUCH ULTRA test strip USE AS DIRECTED IN THE MORNING, AT NOON, AND AT BEDTIME 100 strip 0   silver sulfADIAZINE (SILVADENE) 1 % cream Apply 1 application topically daily. 50 g 0   triamcinolone cream (KENALOG) 0.1 % APPLY TO AFFECTED AREA TWICE A DAY     valsartan-hydrochlorothiazide (DIOVAN-HCT) 80-12.5 MG tablet TAKE 1 TABLET BY MOUTH EVERY DAY 90 tablet 3   No current facility-administered medications for this visit.    Allergies-reviewed and updated Allergies  Allergen Reactions   Ramipril Cough and Other (See Comments)   Oxycodone Nausea Only   Oxycodone-Acetaminophen Nausea And Vomiting   Rabeprazole Other (See Comments)    Social History   Social History Narrative   Moved to [place] in 2011 from [place] due to more affordable.       Married 1972 Reita Cliche). 2 kids. 5 grandkids. 2 in [place], 3 [place].       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       Hobbies: babysitting, casino      Left handed   Objective  Objective:  BP 132/70   Pulse 74   Temp 98 F (36.7 C)   Ht 5\' 1"  (1.549 m)   Wt 148 lb (67.1 kg)   LMP  (LMP Unknown)   SpO2 99%   BMI 27.96 kg/m   Gen: NAD, resting comfortably HEENT: Mucous membranes are moist. Oropharynx normal Neck: no thyromegaly CV: RRR no murmurs rubs or gallops Lungs: CTAB no crackles, wheeze, rhonchi Abdomen: soft/nontender/nondistended/normal bowel sounds. No rebound or guarding.  Ext: no edema Skin: warm, dry Neuro: grossly normal, moves all extremities, PERRLA, stable tremor  Diabetic Foot Exam - Simple   Simple Foot Form Diabetic Foot exam was performed with the following findings: Yes 12/26/2023 12:05 PM  Visual Inspection No deformities, no ulcerations, no other skin breakdown bilaterally: Yes Sensation Testing Intact to touch and monofilament testing bilaterally: Yes Pulse Check Posterior Tibialis and Dorsalis pulse intact bilaterally: Yes Comments        Assessment and Plan   73 y.o. female presenting for annual physical.  Health Maintenance counseling: 1. Anticipatory guidance: Patient counseled regarding regular dental exams -q6 months, eye exams - yearly,  avoiding smoking and second hand smoke , limiting alcohol to 1 beverage per day- very rare if goes out- enjoys [place] , no illicit drugs .   2. Risk factor reduction:  Advised patient of need for regular exercise and diet rich and fruits and vegetables to reduce risk of heart attack and stroke.  Exercise- walking 4 days a week for half hour. Cautious speed with prior ankle fracture Diet/weight management-Down 5 pounds from last physical- trying to improve diet.  Wt Readings from Last 3 Encounters:  12/26/23 148 lb (67.1 kg)  10/26/23 149 lb 6.4 oz (67.8 kg)  05/21/23 150 lb 9.6 oz (68.3 kg)  3. Immunizations/screenings/ancillary studies-fully up-to-date as declines COVID shot  Immunization History  Administered Date(s) Administered   Fluad Quad(high Dose 65+) 08/17/2020, 07/17/2022   Fluad Trivalent(High Dose 65+) 07/31/2023   Influenza Split 08/09/2011, 08/13/2012   Influenza, High Dose Seasonal PF 08/17/2016, 07/30/2017,  07/23/2018, 07/23/2019, 07/18/2021   Influenza,inj,Quad PF,6+ Mos 06/30/2013, 07/24/2014, 08/09/2015   Influenza-Unspecified 07/23/2019   PFIZER(Purple Top)SARS-COV-2 Vaccination 11/07/2019, 11/28/2019, 10/25/2020   Pneumococcal Conjugate-13 12/31/2013   Pneumococcal Polysaccharide-23 01/26/2015   Tdap 10/16/2002, 02/24/2013   Zoster Recombinant(Shingrix) 03/14/2017, 05/21/2017   Zoster, Live 12/13/2010  4. Cervical cancer screening- pelvic exam with Dr. Richardson Dopp every 2 years 5. Breast cancer screening-  breast exam with Dr. Richardson Dopp and mammogram 08/16/23 6. Colon cancer screening - April 30, 2014 with 10-year repeat-due later this year- goes in july  7. Skin cancer screening-dermatology every 6 months. advised regular sunscreen use. Denies worrisome, changing, or new skin lesions.  8. Birth control/STD check- only active with husband and postmenopausal 9. Osteoporosis screening at 63-follows with GYN.  Actonel in the past.  Walks for weightbearing exercise and takes Citracal and extra vitamin D. Ankle fractures in last year- working hard to avoid falls/poor foot placement with poor balance- no longer using heals 10. Smoking associated screening -former smoker-quit well over 20 years ago-no regular screenings required+   Status of chronic or acute concerns   #social update- godson with colon cancer at 47  #lingering cough- had in January- finally has cleared up  # Diabetes S: Medication:Metformin 500 mg extended release 2 tablets twice daily-on B12 Lab Results  Component Value Date   HGBA1C 6.9 (H) 05/21/2023   HGBA1C 6.7 (H) 11/20/2022   HGBA1C 7.3 (H) 05/18/2022  A/P: hopefully stable- update a1c today. Continue current meds for now  -microalbumin to creatinine ratio lab error discussion- may adjust to over 60- but update today and increase valsartan if still above 30   #hypertension S: medication: Valsartan hydrochlorothiazide 80-12.5 mg BP Readings from Last 3 Encounters:  12/26/23  132/70  10/26/23 124/72  05/21/23 122/72  A/P: well controlled continue current medications    #hyperlipidemia S: Medication:Atorvastatin 40 mg daily  Lab Results  Component Value Date   CHOL 158 11/20/2022   HDL 46.30 11/20/2022   LDLCALC 40 11/18/2021   LDLDIRECT  90.0 11/20/2022   TRIG 216.0 (H) 11/20/2022   CHOLHDL 3 11/20/2022   A/P: hopefully stable- update lipid panel today. Continue current meds for now   # Depression S: Medication:Citalopram 20 mg A/P: full remission - see phq9 of 0 - continue current medications- she is pausing citalopram on azithromycin and no side effects    # Osteoporosis S: Last DEXA: Follows with GYN- may get at next visit  Medication (bisphosphonate or prolia): prior prescription. Still takes calcium and vitamin D and still walking A/P: noted- follow up with gynecology for next dexa  # GERD S:Medication: Omeprazole 20 mg-but empirically takes B12 A/P: stable- continue current medicines     # Asthma S: Maintenance Medication: none As needed medication: Albuterol.  A/P: no recent issues- continue to monitor    # Psoriasis-follows with dermatology- wants to stay off medicine other than topicals   Recommended follow up: Return in about 6 months (around 06/27/2024) for followup or sooner if needed.Schedule b4 you leave.  Lab/Order associations: fasting   ICD-10-CM   1. Preventative health care  Z00.00     2. Controlled type 2 diabetes mellitus without complication, without long-term current use of insulin (HCC)  E11.9     3. Essential hypertension  I10     4. Hyperlipidemia associated with type 2 diabetes mellitus (HCC)  E11.69    E78.5     5. Vitamin D deficiency  E55.9     6. Age-related osteoporosis without current pathological fracture  M81.0      No orders of the defined types were placed in this encounter.  Return precautions advised.  Tana Conch, MD

## 2023-12-26 NOTE — Patient Instructions (Addendum)
 Unless we change valsartan or a1c above 7   If urine diabetes test ratio over 30 may increase valsartan -ill reach out of the you  Please stop by lab before you go If you have mychart- we will send your results within 3 business days of [place] receiving them.  If you do not have mychart- we will call you about results within 5 business days of [place] receiving them.  *please also note that you will see labs on mychart as soon as they post. I will later go in and write notes on them- will say "notes from Dr. Durene Cal"   Recommended follow up: Return in about 6 months (around 06/27/2024) for followup or sooner if needed.Schedule b4 you leave.

## 2023-12-27 ENCOUNTER — Encounter: Payer: Self-pay | Admitting: Family Medicine

## 2023-12-28 ENCOUNTER — Other Ambulatory Visit: Payer: Self-pay

## 2023-12-28 ENCOUNTER — Encounter: Payer: Self-pay | Admitting: Family Medicine

## 2023-12-28 DIAGNOSIS — D649 Anemia, unspecified: Secondary | ICD-10-CM

## 2023-12-28 NOTE — Telephone Encounter (Signed)
 See first part of question, 2nd part has been addressed.

## 2024-02-07 ENCOUNTER — Encounter: Payer: Self-pay | Admitting: Family Medicine

## 2024-02-07 ENCOUNTER — Other Ambulatory Visit (INDEPENDENT_AMBULATORY_CARE_PROVIDER_SITE_OTHER)

## 2024-02-07 DIAGNOSIS — D649 Anemia, unspecified: Secondary | ICD-10-CM

## 2024-02-07 LAB — CBC WITH DIFFERENTIAL/PLATELET
Basophils Absolute: 0.1 10*3/uL (ref 0.0–0.1)
Basophils Relative: 0.7 % (ref 0.0–3.0)
Eosinophils Absolute: 0.1 10*3/uL (ref 0.0–0.7)
Eosinophils Relative: 1.8 % (ref 0.0–5.0)
HCT: 38.1 % (ref 36.0–46.0)
Hemoglobin: 11.9 g/dL — ABNORMAL LOW (ref 12.0–15.0)
Lymphocytes Relative: 37 % (ref 12.0–46.0)
Lymphs Abs: 3.1 10*3/uL (ref 0.7–4.0)
MCHC: 31.4 g/dL (ref 30.0–36.0)
MCV: 79.8 fl (ref 78.0–100.0)
Monocytes Absolute: 0.8 10*3/uL (ref 0.1–1.0)
Monocytes Relative: 9.4 % (ref 3.0–12.0)
Neutro Abs: 4.2 10*3/uL (ref 1.4–7.7)
Neutrophils Relative %: 51.1 % (ref 43.0–77.0)
Platelets: 330 10*3/uL (ref 150.0–400.0)
RBC: 4.77 Mil/uL (ref 3.87–5.11)
RDW: 15.5 % (ref 11.5–15.5)
WBC: 8.3 10*3/uL (ref 4.0–10.5)

## 2024-02-08 ENCOUNTER — Encounter: Payer: Self-pay | Admitting: Pediatrics

## 2024-03-24 ENCOUNTER — Encounter: Payer: Self-pay | Admitting: Pediatrics

## 2024-03-24 ENCOUNTER — Ambulatory Visit (AMBULATORY_SURGERY_CENTER)

## 2024-03-24 VITALS — Ht 60.0 in | Wt 162.0 lb

## 2024-03-24 DIAGNOSIS — Z1211 Encounter for screening for malignant neoplasm of colon: Secondary | ICD-10-CM

## 2024-03-24 MED ORDER — SUPREP BOWEL PREP KIT 17.5-3.13-1.6 GM/177ML PO SOLN: 1.0000 | Freq: Once | ORAL | 0 refills | Status: AC

## 2024-03-24 NOTE — Progress Notes (Signed)

## 2024-04-06 ENCOUNTER — Other Ambulatory Visit: Payer: Self-pay | Admitting: Family Medicine

## 2024-04-14 DIAGNOSIS — D2272 Melanocytic nevi of left lower limb, including hip: Secondary | ICD-10-CM | POA: Diagnosis not present

## 2024-04-14 DIAGNOSIS — L821 Other seborrheic keratosis: Secondary | ICD-10-CM | POA: Diagnosis not present

## 2024-04-14 DIAGNOSIS — D225 Melanocytic nevi of trunk: Secondary | ICD-10-CM | POA: Diagnosis not present

## 2024-04-14 DIAGNOSIS — Z411 Encounter for cosmetic surgery: Secondary | ICD-10-CM | POA: Diagnosis not present

## 2024-04-14 DIAGNOSIS — L409 Psoriasis, unspecified: Secondary | ICD-10-CM | POA: Diagnosis not present

## 2024-04-14 DIAGNOSIS — Z86006 Personal history of melanoma in-situ: Secondary | ICD-10-CM | POA: Diagnosis not present

## 2024-04-14 DIAGNOSIS — L578 Other skin changes due to chronic exposure to nonionizing radiation: Secondary | ICD-10-CM | POA: Diagnosis not present

## 2024-04-14 DIAGNOSIS — L820 Inflamed seborrheic keratosis: Secondary | ICD-10-CM | POA: Diagnosis not present

## 2024-04-14 DIAGNOSIS — L814 Other melanin hyperpigmentation: Secondary | ICD-10-CM | POA: Diagnosis not present

## 2024-04-15 NOTE — Progress Notes (Unsigned)
 [facility] History and Physical   Primary Care Physician:  Katrinka Garnette KIDD, MD   Reason for Procedure:  Colorectal cancer screening  Plan:    Screening colonoscopy     HPI: Candice Guerrero is a 73 y.o. female undergoing screening colonoscopy for colorectal cancer screening.  Patient underwent colonoscopy in 2015 which revealed left-sided diverticuli and internal hemorrhoids.  No family history of colorectal cancer or polyps.  Patient denies current symptoms of change in bowel habits or rectal bleeding at the time of this exam.   Past Medical History:  Diagnosis Date   Abnormal mammogram of right breast 07/18/2017   Allergy    SEASONAL   Benign hematuria    Cataract    LEFT EYE   Dental crowns present    Depression    Diabetes mellitus    NIDDM   GERD (gastroesophageal reflux disease)    Headache(784.0)    migraines, sinus   Hiatal hernia    Hyperlipidemia    Hypertension    under control, has been on med. since 2005   Osteopenia    Psoriasis    arms, legs, back   Snores    denies sx. of sleep apnea    Past Surgical History:  Procedure Laterality Date   BREAST LUMPECTOMY WITH RADIOACTIVE SEED LOCALIZATION Right 07/18/2017   Procedure: RIGHT BREAST LUMPECTOMY WITH RADIOACTIVE SEED LOCALIZATION;  Surgeon: Gail Favorite, MD;  Location: [facility];  Service: General;  Laterality: Right;   COLONOSCOPY     DILATION AND CURETTAGE OF UTERUS     HARDWARE REMOVAL  07/11/2012   Procedure: HARDWARE REMOVAL;  Surgeon: Lamar LULLA Leonor Mickey., MD;  Location: [facility];  Service: Orthopedics;  Laterality: Left;  pin removal wire out and debride pisiform   left wrist surgery     July 9th 2013   TONSILLECTOMY     TOOTH EXTRACTION Right 12/13/2016   Procedure: SURGICAL REMOVAL OF ODONTOGENIC TUMOR AND REMOVAL OF IMPACTED TOOTH #1;  Surgeon: Ezzie Pinal, DDS;  Location: [facility];  Service: Oral Surgery;  Laterality: Right;    TRIGGER FINGER RELEASE  07/11/2012   Procedure: RELEASE TRIGGER FINGER/A-1 PULLEY;  Surgeon: Lamar LULLA Leonor Mickey., MD;  Location: [facility];  Service: Orthopedics;  Laterality: Left;  LEFT LONG FINGER    Prior to Admission medications   Medication Sig Start Date End Date Taking? Authorizing Provider  albuterol  (VENTOLIN  HFA) 108 (90 Base) MCG/ACT inhaler TAKE 2 PUFFS BY MOUTH EVERY 6 HOURS AS NEEDED FOR WHEEZE OR SHORTNESS OF BREATH Patient not taking: Reported on 03/24/2024 11/20/23   Allwardt, Mardy HERO, PA-C  aspirin-acetaminophen -caffeine (EXCEDRIN MIGRAINE) 250-250-65 MG tablet Take 1 tablet by mouth. 12/02/19   [provider]  atorvastatin  (LIPITOR) 40 MG tablet TAKE 1 TABLET BY MOUTH EVERY DAY 06/14/23   Katrinka Garnette KIDD, MD  Blood Glucose Monitoring Suppl (ONE TOUCH ULTRA 2) w/Device KIT USE AS DIRECTED 3 TIMES A DAY (MORNING, NOON, AND BEDTIME) 12/11/22   Katrinka Garnette KIDD, MD  calcium  carbonate (OSCAL) 1500 (600 Ca) MG TABS tablet Take by mouth daily.    [provider]  Cholecalciferol (VITAMIN D -3 PO) Take 2,000 Units by mouth daily.    [provider]  citalopram  (CELEXA ) 20 MG tablet TAKE 1 TABLET BY MOUTH EVERY DAY 04/07/24   Katrinka Garnette KIDD, MD  Citalopram  Hydrobromide (CELEXA  PO) CeleXA     [provider]  Cyanocobalamin  (B-12 PO) Take 1,000 mcg by  mouth daily.    [provider]  loratadine  (CLARITIN ) 10 MG tablet TAKE 1 TABLET BY MOUTH EVERY DAY Patient not taking: Reported on 03/24/2024 11/19/23   Allwardt, Alyssa M, PA-C  MAGNESIUM PO Take 800 mg by mouth daily.    [provider]  metFORMIN  (GLUCOPHAGE -XR) 500 MG 24 hr tablet TAKE 2 TABLETS BY MOUTH IN THE MORNING AND AT BEDTIME EVERY DAY 06/14/23   Katrinka Garnette KIDD, MD  omeprazole  (PRILOSEC) 20 MG capsule TAKE 1 CAPSULE BY MOUTH EVERY DAY 08/13/23   Katrinka Garnette KIDD, MD  [facility] ULTRA test strip USE AS DIRECTED IN THE MORNING, AT NOON, AND AT BEDTIME 01/22/23    Katrinka Garnette KIDD, MD  silver  sulfADIAZINE  (SILVADENE ) 1 % cream Apply 1 application topically daily. Patient not taking: Reported on 03/24/2024 01/14/21   Katrinka Garnette KIDD, MD  triamcinolone  cream (KENALOG ) 0.1 % APPLY TO AFFECTED AREA TWICE A DAY 01/26/20   [provider]  valsartan -hydrochlorothiazide  (DIOVAN -HCT) 80-12.5 MG tablet TAKE 1 TABLET BY MOUTH EVERY DAY 10/05/23   Katrinka Garnette KIDD, MD  Vitamin D -Vitamin K (VITAMIN D2 + K1 PO) TAKE 1 CAPSULE ONCE EVERY 7 DAYS    [provider]    Current Outpatient Medications  Medication Sig Dispense Refill   aspirin-acetaminophen -caffeine (EXCEDRIN MIGRAINE) 250-250-65 MG tablet Take 1 tablet by mouth.     atorvastatin  (LIPITOR) 40 MG tablet TAKE 1 TABLET BY MOUTH EVERY DAY 90 tablet 3   Blood Glucose Monitoring Suppl (ONE TOUCH ULTRA 2) w/Device KIT USE AS DIRECTED 3 TIMES A DAY (MORNING, NOON, AND BEDTIME) 1 kit 3   calcium  carbonate (OSCAL) 1500 (600 Ca) MG TABS tablet Take by mouth daily.     Cholecalciferol (VITAMIN D -3 PO) Take 2,000 Units by mouth daily.     citalopram  (CELEXA ) 20 MG tablet TAKE 1 TABLET BY MOUTH EVERY DAY 90 tablet 3   Citalopram  Hydrobromide (CELEXA  PO) CeleXA      Cyanocobalamin  (B-12 PO) Take 1,000 mcg by mouth daily.     MAGNESIUM PO Take 800 mg by mouth daily.     metFORMIN  (GLUCOPHAGE -XR) 500 MG 24 hr tablet TAKE 2 TABLETS BY MOUTH IN THE MORNING AND AT BEDTIME EVERY DAY 360 tablet 3   omeprazole  (PRILOSEC) 20 MG capsule TAKE 1 CAPSULE BY MOUTH EVERY DAY 90 capsule 3   ONETOUCH ULTRA test strip USE AS DIRECTED IN THE MORNING, AT NOON, AND AT BEDTIME 100 strip 0   valsartan -hydrochlorothiazide  (DIOVAN -HCT) 80-12.5 MG tablet TAKE 1 TABLET BY MOUTH EVERY DAY 90 tablet 3   Vitamin D -Vitamin K (VITAMIN D2 + K1 PO) TAKE 1 CAPSULE ONCE EVERY 7 DAYS     albuterol  (VENTOLIN  HFA) 108 (90 Base) MCG/ACT inhaler TAKE 2 PUFFS BY MOUTH EVERY 6 HOURS AS NEEDED FOR WHEEZE OR SHORTNESS OF BREATH (Patient not taking:  Reported on 04/16/2024) 18 each 1   loratadine  (CLARITIN ) 10 MG tablet TAKE 1 TABLET BY MOUTH EVERY DAY (Patient not taking: Reported on 04/16/2024) 90 tablet 1   silver  sulfADIAZINE  (SILVADENE ) 1 % cream Apply 1 application topically daily. (Patient not taking: Reported on 03/24/2024) 50 g 0   triamcinolone  cream (KENALOG ) 0.1 % APPLY TO AFFECTED AREA TWICE A DAY     Current Facility-Administered Medications  Medication Dose Route Frequency Provider Last Rate Last Admin   0.9 %  sodium chloride  infusion  500 mL Intravenous Once Melisia Leming M, MD        Allergies as of 04/16/2024 - Review Complete 04/16/2024  Allergen Reaction Noted   Oxycodone  Nausea And Vomiting and Nausea Only 05/18/2014   Ramipril Cough and Other (See Comments) 04/19/2012   Rabeprazole Other (See Comments) 02/18/2018    Family History  Problem Relation Age of Onset   Hyperlipidemia Mother    Hypertension Mother    Tremor Mother    Hypertension Father    Kidney disease Father    Renal Disease Father    Anesthesia problems Sister        post-op N/V, hard to wake up post-op   High blood pressure Brother    Prostate cancer Brother    Diabetes Maternal Uncle    Healthy Son    Healthy Son    Colon cancer Neg Hx    Colon polyps Neg Hx    Esophageal cancer Neg Hx    Rectal cancer Neg Hx    Stomach cancer Neg Hx     Social History   Socioeconomic History   Marital status: Married    Spouse name: Not on file   Number of children: 2   Years of education: Not on file   Highest education level: Not on file  Occupational History   Occupation: Retired  Tobacco Use   Smoking status: Former    Current packs/day: 0.00    Average packs/day: 0.1 packs/day for 20.0 years (2.0 ttl pk-yrs)    Types: Cigarettes    Start date: 10/16/1978    Quit date: 10/16/1998    Years since quitting: 25.5   Smokeless tobacco: Never   Tobacco comments:    quit smoking in 2000  Vaping Use   Vaping status: Never Used  Substance and  Sexual Activity   Alcohol use: Yes    Comment: socially, weekends   Drug use: No   Sexual activity: Yes  Other Topics Concern   Not on file  Social History Narrative   Moved to [place] in 2011 from [place] due to more affordable.       Married 1972 [place]). 2 kids. 5 grandkids. 2 in [place], 3 [place].       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       Hobbies: babysitting, casino      Left handed   Social Drivers of Health   Financial Resource Strain: Low Risk  (06/06/2021)   Overall Financial Resource Strain (CARDIA)    Difficulty of Paying Living Expenses: Not hard at all  Food Insecurity: No Food Insecurity (06/06/2021)   Hunger Vital Sign    Worried About Running Out of Food in the Last Year: Never true    Ran Out of Food in the Last Year: Never true  Transportation Needs: No Transportation Needs (06/06/2021)   PRAPARE - Administrator, Civil Service (Medical): No    Lack of Transportation (Non-Medical): No  Physical Activity: Inactive (06/06/2021)   Exercise Vital Sign    Days of Exercise per Week: 0 days    Minutes of Exercise per Session: 0 min  Stress: No Stress Concern Present (06/06/2021)   [employer] of Occupational Health - Occupational Stress Questionnaire    Feeling of Stress : Only a little  Social Connections: Moderately Integrated (06/06/2021)   Social Connection and Isolation Panel    Frequency of Communication with Friends and Family: More than three times a week    Frequency of Social Gatherings with Friends and Family: Twice a week  Attends Religious Services: More than 4 times per year    Active Member of Clubs or Organizations: No    Attends Banker Meetings: Never    Marital Status: Married  Catering manager Violence: Not At Risk (06/06/2021)   Humiliation, Afraid, Rape, and Kick questionnaire    Fear of Current or Ex-Partner: No    Emotionally Abused: No     Physically Abused: No    Sexually Abused: No    Review of Systems:  All other review of systems negative except as mentioned in the HPI.  Physical Exam: Vital signs BP (!) 153/93   Pulse 86   Temp (!) 97.3 F (36.3 C) (Skin)   Ht 5' 1 (1.549 m)   Wt 162 lb (73.5 kg)   LMP  (LMP Unknown)   SpO2 98%   BMI 30.61 kg/m   General:   Alert,  Well-developed, well-nourished, pleasant and cooperative in NAD Airway:  Mallampati 2 Lungs:  Clear throughout to auscultation.   Heart:  Regular rate and rhythm; no murmurs, clicks, rubs,  or gallops. Abdomen:  Soft, nontender and nondistended. Normal bowel sounds.   Neuro/Psych:  Normal mood and affect. A and O x 3  Inocente Hausen, MD [facility]

## 2024-04-16 ENCOUNTER — Encounter: Payer: Self-pay | Admitting: Pediatrics

## 2024-04-16 ENCOUNTER — Ambulatory Visit: Admitting: Pediatrics

## 2024-04-16 VITALS — BP 130/64 | HR 67 | Temp 97.3°F | Resp 12 | Ht 61.0 in | Wt 162.0 lb

## 2024-04-16 DIAGNOSIS — K648 Other hemorrhoids: Secondary | ICD-10-CM | POA: Diagnosis not present

## 2024-04-16 DIAGNOSIS — E119 Type 2 diabetes mellitus without complications: Secondary | ICD-10-CM | POA: Diagnosis not present

## 2024-04-16 DIAGNOSIS — D122 Benign neoplasm of ascending colon: Secondary | ICD-10-CM | POA: Diagnosis not present

## 2024-04-16 DIAGNOSIS — K635 Polyp of colon: Secondary | ICD-10-CM | POA: Diagnosis not present

## 2024-04-16 DIAGNOSIS — F32A Depression, unspecified: Secondary | ICD-10-CM | POA: Diagnosis not present

## 2024-04-16 DIAGNOSIS — K5730 Diverticulosis of large intestine without perforation or abscess without bleeding: Secondary | ICD-10-CM | POA: Diagnosis not present

## 2024-04-16 DIAGNOSIS — Z1211 Encounter for screening for malignant neoplasm of colon: Secondary | ICD-10-CM

## 2024-04-16 DIAGNOSIS — I10 Essential (primary) hypertension: Secondary | ICD-10-CM | POA: Diagnosis not present

## 2024-04-16 MED ORDER — SODIUM CHLORIDE 0.9 % IV SOLN: 500.0000 mL | Freq: Once | INTRAVENOUS | Status: DC

## 2024-04-16 NOTE — Progress Notes (Signed)
 Called to room to assist during endoscopic procedure.  Patient ID and intended procedure confirmed with present staff. Received instructions for my participation in the procedure from the performing physician.

## 2024-04-16 NOTE — Progress Notes (Signed)
 Pt sedate, gd SR's, VSS, report to RN

## 2024-04-16 NOTE — Op Note (Signed)
 [facility] Patient Name: Candice Guerrero Procedure Date: 04/16/2024 9:43 AM MRN: [id number] Endoscopist: Inocente Hausen , MD, [id number] Age: 73 Referring MD:  Date of Birth: 11/23/1950 Gender: Female Account #: [id number] Procedure:                Colonoscopy Indications:              Screening for colorectal malignant neoplasm, Last                            colonoscopy: 2015 Medicines:                Monitored Anesthesia Care Procedure:                Pre-Anesthesia Assessment:                           - Prior to the procedure, a History and Physical                            was performed, and patient medications and                            allergies were reviewed. The patient's tolerance of                            previous anesthesia was also reviewed. The risks                            and benefits of the procedure and the sedation                            options and risks were discussed with the patient.                            All questions were answered, and informed consent                            was obtained. Prior Anticoagulants: The patient has                            taken no anticoagulant or antiplatelet agents. ASA                            Grade Assessment: III - A patient with severe                            systemic disease. After reviewing the risks and                            benefits, the patient was deemed in satisfactory                            condition to undergo the procedure.  After obtaining informed consent, the colonoscope                            was passed under direct vision. Throughout the                            procedure, the patient's blood pressure, pulse, and                            oxygen saturations were monitored continuously. The                            PCF-H190TL Slim SN [id number] was introduced through                            the anus and advanced to the cecum,  identified by                            appendiceal orifice and ileocecal valve. The                            colonoscopy was technically difficult and complex                            due to restricted mobility of the colon in the                            sigmoid colon. Successful completion of the                            procedure was aided by withdrawing the scope and                            replacing with the pediatric colonoscope and                            ultimately an Ultraslim pediatric colonoscope. The                            patient tolerated the procedure well. The quality                            of the bowel preparation was good. The ileocecal                            valve, appendiceal orifice, and rectum were                            photographed. Scope In: 10:06:38 AM Scope Out: 10:23:43 AM Scope Withdrawal Time: 0 hours 12 minutes 10 seconds  Total Procedure Duration: 0 hours 17 minutes 5 seconds  Findings:                 The perianal and digital rectal examinations were  normal. Pertinent negatives include normal                            sphincter tone and no palpable rectal lesions.                           Multiple small-mouthed diverticula were found in                            the sigmoid colon. The sigmoid colon was noteworthy                            for significantly restricted mobility likely                            secondary to diverticuli. The adult colonoscope was                            changed to a peds colonoscope which could not                            traverse the sigmoid region. Ultimately an                            ultraslim colonoscope was utilized and was able to                            traverse a narrowing 25 cm proximal to the anal                            verge.                           Two sessile polyps were found in the ascending                            colon. The  polyps were 4 to 5 mm in size. These                            polyps were removed with a cold snare. Resection                            and retrieval were complete.                           A 5 mm polyp was found in the ascending colon. The                            polyp was sessile. The polyp was removed with a                            cold biopsy forceps. Resection and retrieval were  complete.                           Internal hemorrhoids were found during retroflexion. Complications:            No immediate complications. Estimated blood loss:                            Minimal. Estimated Blood Loss:     Estimated blood loss was minimal. Impression:               - Diverticulosis in the sigmoid colon.                           - Two 4 to 5 mm polyps in the ascending colon,                            removed with a cold snare. Resected and retrieved.                           - One 5 mm polyp in the ascending colon, removed                            with a cold biopsy forceps. Resected and retrieved.                           - Internal hemorrhoids. Recommendation:           - Discharge patient to home (ambulatory).                           - Await pathology results.                           - If all 3 polyps are adenomatous in nature,                            consider surveillance colonoscopy in 3 years if                            patient's health remains stable without significant                            medical comorbidities.                           - The findings and recommendations were discussed                            with the patient's family.                           - Return to referring physician.                           - Patient has a contact number available for  emergencies. The signs and symptoms of potential                            delayed complications were discussed with the                             patient. Return to normal activities tomorrow.                            Written discharge instructions were provided to the                            patient. Inocente Hausen, MD 04/16/2024 10:30:42 AM This report has been signed electronically.

## 2024-04-16 NOTE — Progress Notes (Signed)
 Pt's states no medical or surgical changes since previsit or office visit.

## 2024-04-16 NOTE — Patient Instructions (Signed)

## 2024-04-21 LAB — SURGICAL PATHOLOGY

## 2024-04-23 ENCOUNTER — Ambulatory Visit: Payer: Self-pay | Admitting: Pediatrics

## 2024-04-23 DIAGNOSIS — H52203 Unspecified astigmatism, bilateral: Secondary | ICD-10-CM | POA: Diagnosis not present

## 2024-04-23 DIAGNOSIS — H5213 Myopia, bilateral: Secondary | ICD-10-CM | POA: Diagnosis not present

## 2024-04-23 DIAGNOSIS — E119 Type 2 diabetes mellitus without complications: Secondary | ICD-10-CM | POA: Diagnosis not present

## 2024-04-23 DIAGNOSIS — H25813 Combined forms of age-related cataract, bilateral: Secondary | ICD-10-CM | POA: Diagnosis not present

## 2024-04-23 LAB — HM DIABETES EYE EXAM

## 2024-05-21 ENCOUNTER — Other Ambulatory Visit: Payer: Self-pay | Admitting: Family Medicine

## 2024-06-30 DIAGNOSIS — M67912 Unspecified disorder of synovium and tendon, left shoulder: Secondary | ICD-10-CM | POA: Diagnosis not present

## 2024-06-30 DIAGNOSIS — M67911 Unspecified disorder of synovium and tendon, right shoulder: Secondary | ICD-10-CM | POA: Diagnosis not present

## 2024-07-02 ENCOUNTER — Ambulatory Visit: Payer: Self-pay | Admitting: Family Medicine

## 2024-07-02 ENCOUNTER — Ambulatory Visit: Admitting: Family Medicine

## 2024-07-02 ENCOUNTER — Encounter: Payer: Self-pay | Admitting: Family Medicine

## 2024-07-02 VITALS — BP 110/68 | HR 71 | Temp 97.5°F | Ht 61.0 in | Wt 153.0 lb

## 2024-07-02 DIAGNOSIS — I10 Essential (primary) hypertension: Secondary | ICD-10-CM | POA: Diagnosis not present

## 2024-07-02 DIAGNOSIS — E119 Type 2 diabetes mellitus without complications: Secondary | ICD-10-CM | POA: Diagnosis not present

## 2024-07-02 DIAGNOSIS — E1169 Type 2 diabetes mellitus with other specified complication: Secondary | ICD-10-CM

## 2024-07-02 DIAGNOSIS — Z7984 Long term (current) use of oral hypoglycemic drugs: Secondary | ICD-10-CM | POA: Diagnosis not present

## 2024-07-02 DIAGNOSIS — Z79899 Other long term (current) drug therapy: Secondary | ICD-10-CM | POA: Diagnosis not present

## 2024-07-02 DIAGNOSIS — Z23 Encounter for immunization: Secondary | ICD-10-CM

## 2024-07-02 DIAGNOSIS — E785 Hyperlipidemia, unspecified: Secondary | ICD-10-CM | POA: Diagnosis not present

## 2024-07-02 LAB — CBC WITH DIFFERENTIAL/PLATELET
Basophils Absolute: 0 K/uL (ref 0.0–0.1)
Basophils Relative: 0.3 % (ref 0.0–3.0)
Eosinophils Absolute: 0 K/uL (ref 0.0–0.7)
Eosinophils Relative: 0 % (ref 0.0–5.0)
HCT: 37.9 % (ref 36.0–46.0)
Hemoglobin: 11.6 g/dL — ABNORMAL LOW (ref 12.0–15.0)
Lymphocytes Relative: 20.1 % (ref 12.0–46.0)
Lymphs Abs: 3.5 K/uL (ref 0.7–4.0)
MCHC: 30.6 g/dL (ref 30.0–36.0)
MCV: 76.8 fl — ABNORMAL LOW (ref 78.0–100.0)
Monocytes Absolute: 1 K/uL (ref 0.1–1.0)
Monocytes Relative: 5.8 % (ref 3.0–12.0)
Neutro Abs: 12.9 K/uL — ABNORMAL HIGH (ref 1.4–7.7)
Neutrophils Relative %: 73.8 % (ref 43.0–77.0)
Platelets: 433 K/uL — ABNORMAL HIGH (ref 150.0–400.0)
RBC: 4.93 Mil/uL (ref 3.87–5.11)
RDW: 16.8 % — ABNORMAL HIGH (ref 11.5–15.5)
WBC: 17.5 K/uL — ABNORMAL HIGH (ref 4.0–10.5)

## 2024-07-02 LAB — COMPREHENSIVE METABOLIC PANEL WITH GFR
ALT: 22 U/L (ref 0–35)
AST: 21 U/L (ref 0–37)
Albumin: 4.5 g/dL (ref 3.5–5.2)
Alkaline Phosphatase: 85 U/L (ref 39–117)
BUN: 26 mg/dL — ABNORMAL HIGH (ref 6–23)
CO2: 27 meq/L (ref 19–32)
Calcium: 10.1 mg/dL (ref 8.4–10.5)
Chloride: 101 meq/L (ref 96–112)
Creatinine, Ser: 0.82 mg/dL (ref 0.40–1.20)
GFR: 70 mL/min (ref >60.00)
Glucose, Bld: 104 mg/dL — ABNORMAL HIGH (ref 70–99)
Potassium: 5.3 meq/L — ABNORMAL HIGH (ref 3.5–5.1)
Sodium: 141 meq/L (ref 135–145)
Total Bilirubin: 0.4 mg/dL (ref 0.2–1.2)
Total Protein: 7.5 g/dL (ref 6.0–8.3)

## 2024-07-02 LAB — VITAMIN B12: Vitamin B-12: 566 pg/mL (ref 211–911)

## 2024-07-02 LAB — TSH: TSH: 1.52 u[IU]/mL (ref 0.35–5.50)

## 2024-07-02 MED ORDER — TIZANIDINE HCL 2 MG PO TABS: 2.0000 mg | ORAL_TABLET | Freq: Three times a day (TID) | ORAL | 0 refills | Status: AC | PRN

## 2024-07-02 NOTE — Patient Instructions (Addendum)
 Thanks for doing flu shot   Please stop by lab before you go If you have mychart- we will send your results within 3 business days of us  receiving them.  If you do not have mychart- we will call you about results within 5 business days of us  receiving them.  *please also note that you will see labs on mychart as soon as they post. I will later go in and write notes on them- will say notes from Dr. Katrinka   Tizanidine  trial for back- if not improving in 1-2 weeks or worsens let us  refer you to sports medicine or orthopedic   Recommended follow up: Return in about 6 months (around 12/30/2024) for physical or sooner if needed.Schedule b4 you leave.

## 2024-07-02 NOTE — Progress Notes (Signed)
 Phone [phone] In person visit   Subjective:   Candice Guerrero is a 73 y.o. year old very pleasant female patient who presents for/with See problem oriented charting Chief Complaint  Patient presents with   Diabetes   Hypertension   Back Pain    Lower right sided pain x3 days, requesting muscle relaxers;    Past Medical History-  Patient Active Problem List   Diagnosis Date Noted   Diabetes mellitus type II, controlled, with no complications (HCC) 12/13/2010    Priority: High   Vitamin D  deficiency 01/14/2021    Priority: Medium    Tremor 04/30/2020    Priority: Medium    DDD (degenerative disc disease), cervical 01/29/2020    Priority: Medium    Osteoporosis 07/24/2014    Priority: Medium    IBS (irritable bowel syndrome) 07/24/2014    Priority: Medium    Psoriasis 12/13/2010    Priority: Medium    Essential hypertension 12/13/2010    Priority: Medium    Depression with anxiety 12/13/2010    Priority: Medium    Fatty liver 12/13/2010    Priority: Medium    Hyperlipidemia associated with type 2 diabetes mellitus (HCC) 12/13/2010    Priority: Medium    Acute midline low back pain with left-sided sciatica 11/06/2018    Priority: Low   Abnormal mammogram of right breast 07/18/2017    Priority: Low   Allergic rhinitis 07/24/2014    Priority: Low   Obesity (BMI 30-39.9) 12/31/2013    Priority: Low   Benign hematuria 12/13/2010    Priority: Low   GERD (gastroesophageal reflux disease) 12/13/2010    Priority: Low   Hamstring tendinitis of left thigh 12/25/2019    Medications- reviewed and updated Current Outpatient Medications  Medication Sig Dispense Refill   aspirin-acetaminophen -caffeine (EXCEDRIN MIGRAINE) 250-250-65 MG tablet Take 1 tablet by mouth.     atorvastatin  (LIPITOR) 40 MG tablet TAKE 1 TABLET BY MOUTH EVERY DAY 90 tablet 3   Blood Glucose Monitoring Suppl (ONE TOUCH ULTRA 2) w/Device KIT USE AS DIRECTED 3 TIMES A DAY (MORNING, NOON, AND BEDTIME)  1 kit 3   calcium  carbonate (OSCAL) 1500 (600 Ca) MG TABS tablet Take by mouth daily.     Cholecalciferol (VITAMIN D -3 PO) Take 2,000 Units by mouth daily.     citalopram  (CELEXA ) 20 MG tablet TAKE 1 TABLET BY MOUTH EVERY DAY 90 tablet 3   Citalopram  Hydrobromide (CELEXA  PO) CeleXA      Cyanocobalamin  (B-12 PO) Take 1,000 mcg by mouth daily.     MAGNESIUM PO Take 800 mg by mouth daily.     metFORMIN  (GLUCOPHAGE -XR) 500 MG 24 hr tablet TAKE 2 TABLETS BY MOUTH IN THE MORNING AND AT BEDTIME EVERY DAY 360 tablet 3   omeprazole  (PRILOSEC) 20 MG capsule TAKE 1 CAPSULE BY MOUTH EVERY DAY 90 capsule 3   ONETOUCH ULTRA test strip USE AS DIRECTED IN THE MORNING, AT NOON, AND AT BEDTIME 100 strip 0   tiZANidine  (ZANAFLEX ) 2 MG tablet Take 1 tablet (2 mg total) by mouth every 8 (eight) hours as needed for muscle spasms (do not drive for 8 hours after taking). 20 tablet 0   triamcinolone  cream (KENALOG ) 0.1 % APPLY TO AFFECTED AREA TWICE A DAY     valsartan -hydrochlorothiazide  (DIOVAN -HCT) 80-12.5 MG tablet TAKE 1 TABLET BY MOUTH EVERY DAY 90 tablet 3   Vitamin D -Vitamin K (VITAMIN D2 + K1 PO) TAKE 1 CAPSULE ONCE EVERY 7 DAYS     albuterol  (VENTOLIN   HFA) 108 (90 Base) MCG/ACT inhaler TAKE 2 PUFFS BY MOUTH EVERY 6 HOURS AS NEEDED FOR WHEEZE OR SHORTNESS OF BREATH (Patient not taking: Reported on 07/02/2024) 18 each 1   loratadine  (CLARITIN ) 10 MG tablet TAKE 1 TABLET BY MOUTH EVERY DAY (Patient not taking: Reported on 07/02/2024) 90 tablet 1   silver  sulfADIAZINE  (SILVADENE ) 1 % cream Apply 1 application topically daily. (Patient not taking: Reported on 07/02/2024) 50 g 0   No current facility-administered medications for this visit.     Objective:  BP 110/68 (BP Location: Right Arm, Patient Position: Sitting, Cuff Size: Normal)   Pulse 71   Temp (!) 97.5 F (36.4 C) (Temporal)   Ht 5' 1 (1.549 m)   Wt 153 lb (69.4 kg)   LMP  (LMP Unknown)   SpO2 96%   BMI 28.91 kg/m  Gen: NAD, resting  comfortably CV: RRR no murmurs rubs or gallops Lungs: CTAB no crackles, wheeze, rhonchi Ext: no edema Skin: warm, dry     Assessment and Plan     Discussed the use of AI scribe software for clinical note transcription with the patient, who gave verbal consent to proceed. # right thoracic back pain S:She has been experiencing mid-back muscle pain on the right side for approximately three days, described as a tightness associated with carrying heavy shopping bags. She has not used specific muscle relaxants for similar issues in the past.  O: no midline pain- right thoracic back spasm noted in Paraspinous muscles A/P: Right thoracic back pain with muscle spasm Acute right thoracic back pain with muscle spasm, likely due to carrying heavy shopping bags. No significant findings on physical exam except for muscle tightness. - Prescribe tizanidine  for muscle spasm - Advise follow-up if not improving in 1-2 weeks or if worsens for possible referral to sports medicine or orthopedics   # Diabetes S: Medication:Metformin  500 mg extended release 2 tablets twice daily-on B12 as well - 3-4 days a week- trying [employer] as well lately- in last few months down 9 lbs form last gastroenterology in office visit Lab Results  Component Value Date   HGBA1C 6.8 (H) 12/26/2023   HGBA1C 6.9 (H) 05/21/2023   HGBA1C 6.7 (H) 11/20/2022  A/P: hopefully stable- update a1c today. Continue current meds for now    #hypertension S: medication: Valsartan  hydrochlorothiazide  80-12.5 mg A/P: C continue current medications    #hyperlipidemia S: Medication: Atorvastatin  40 mg daily  Lab Results  Component Value Date   CHOL 145 12/26/2023   HDL 43.10 12/26/2023   LDLCALC 73 12/26/2023   LDLDIRECT 90.0 11/20/2022   TRIG 145.0 12/26/2023   CHOLHDL 3 12/26/2023   A/P: close to ideal goal- continue current medications   # Depression/anxiety S: Medication:Citalopram  20 mg     07/02/2024   10:56 AM  12/26/2023   11:15 AM 05/21/2023    2:35 PM  Depression screen PHQ 2/9  Decreased Interest 0 0 0  Down, Depressed, Hopeless 0 0 0  PHQ - 2 Score 0 0 0  Altered sleeping 1 0 0  Tired, decreased energy 2 0 1  Change in appetite 0 0 0  Feeling bad or failure about yourself  0 0 0  Trouble concentrating 0 0 0  Moving slowly or fidgety/restless 0 0 0  Suicidal thoughts 0 0 0  PHQ-9 Score 3 0 1  Difficult doing work/chores Not difficult at all Not difficult at all Not difficult at all   A/P: reasonable control- continue  current medications    # Osteoporosis S: Last DEXA: Follows with GYN  Medication (bisphosphonate or prolia): prior actonel  5 years but has stayed in osteopenia range afterwards off of that  Calcium : 1200mg  (through diet ok) recommended - taking Vitamin D : 1000 units a day recommended-taking A/P: reports stability with gynecology- continue to monitor   # GERD S:Medication: Omeprazole  20 mg-but empirically takes B12 A/P: very sparing issues- continue current medications     # Psoriasis-follows with dermatology- triamcinolone  cream helpful   #dystonic tremor- has seen Dr. Evonnie in past- opting out of medications  #abnormal colonosocpy- 3 tubular adenomas July 2025 with 3-year repeat as long as health is good  Recommended follow up: Return in about 6 months (around 12/30/2024) for physical or sooner if needed.Schedule b4 you leave. No future appointments.  Lab/Order associations:   ICD-10-CM   1. Controlled type 2 diabetes mellitus without complication, without long-term current use of insulin (HCC)  E11.9 Hemoglobin A1c    Comprehensive metabolic panel with GFR    CBC with Differential/Platelet    2. Essential hypertension  I10     3. Hyperlipidemia associated with type 2 diabetes mellitus (HCC)  E11.69 Comprehensive metabolic panel with GFR   E78.5 CBC with Differential/Platelet    TSH    4. Need for influenza vaccination  Z23 Flu vaccine HIGH DOSE PF(Fluzone  Trivalent)    5. High risk medication use  Z79.899 Vitamin B12      Meds ordered this encounter  Medications   tiZANidine  (ZANAFLEX ) 2 MG tablet    Sig: Take 1 tablet (2 mg total) by mouth every 8 (eight) hours as needed for muscle spasms (do not drive for 8 hours after taking).    Dispense:  20 tablet    Refill:  0    Return precautions advised.  Garnette Lukes, MD

## 2024-07-03 ENCOUNTER — Other Ambulatory Visit: Payer: Self-pay | Admitting: Family Medicine

## 2024-07-03 DIAGNOSIS — R7989 Other specified abnormal findings of blood chemistry: Secondary | ICD-10-CM

## 2024-07-03 DIAGNOSIS — E875 Hyperkalemia: Secondary | ICD-10-CM

## 2024-07-03 LAB — HEMOGLOBIN A1C
Hgb A1c MFr Bld: 6.9 % — ABNORMAL HIGH (ref <5.7)
Mean Plasma Glucose: 151 mg/dL
eAG (mmol/L): 8.4 mmol/L

## 2024-07-08 ENCOUNTER — Encounter: Payer: Self-pay | Admitting: Family Medicine

## 2024-07-08 ENCOUNTER — Ambulatory Visit: Payer: Self-pay | Admitting: Family Medicine

## 2024-07-08 ENCOUNTER — Other Ambulatory Visit (INDEPENDENT_AMBULATORY_CARE_PROVIDER_SITE_OTHER)

## 2024-07-08 DIAGNOSIS — E875 Hyperkalemia: Secondary | ICD-10-CM

## 2024-07-08 DIAGNOSIS — R7989 Other specified abnormal findings of blood chemistry: Secondary | ICD-10-CM | POA: Diagnosis not present

## 2024-07-08 LAB — CBC WITH DIFFERENTIAL/PLATELET
Basophils Absolute: 0.1 K/uL (ref 0.0–0.1)
Basophils Relative: 0.5 % (ref 0.0–3.0)
Eosinophils Absolute: 0.1 K/uL (ref 0.0–0.7)
Eosinophils Relative: 1.1 % (ref 0.0–5.0)
HCT: 37.2 % (ref 36.0–46.0)
Hemoglobin: 11.5 g/dL — ABNORMAL LOW (ref 12.0–15.0)
Lymphocytes Relative: 30.6 % (ref 12.0–46.0)
Lymphs Abs: 3.4 K/uL (ref 0.7–4.0)
MCHC: 30.9 g/dL (ref 30.0–36.0)
MCV: 76.4 fl — ABNORMAL LOW (ref 78.0–100.0)
Monocytes Absolute: 0.8 K/uL (ref 0.1–1.0)
Monocytes Relative: 7.6 % (ref 3.0–12.0)
Neutro Abs: 6.7 K/uL (ref 1.4–7.7)
Neutrophils Relative %: 60.2 % (ref 43.0–77.0)
Platelets: 394 K/uL (ref 150.0–400.0)
RBC: 4.87 Mil/uL (ref 3.87–5.11)
RDW: 16.4 % — ABNORMAL HIGH (ref 11.5–15.5)
WBC: 11.1 K/uL — ABNORMAL HIGH (ref 4.0–10.5)

## 2024-07-08 LAB — BASIC METABOLIC PANEL WITH GFR
BUN: 20 mg/dL (ref 6–23)
CO2: 28 meq/L (ref 19–32)
Calcium: 9.4 mg/dL (ref 8.4–10.5)
Chloride: 103 meq/L (ref 96–112)
Creatinine, Ser: 0.77 mg/dL (ref 0.40–1.20)
GFR: 75.48 mL/min (ref >60.00)
Glucose, Bld: 210 mg/dL — ABNORMAL HIGH (ref 70–99)
Potassium: 4.1 meq/L (ref 3.5–5.1)
Sodium: 140 meq/L (ref 135–145)

## 2024-07-14 ENCOUNTER — Other Ambulatory Visit: Payer: Self-pay | Admitting: Family Medicine

## 2024-07-14 DIAGNOSIS — Z1231 Encounter for screening mammogram for malignant neoplasm of breast: Secondary | ICD-10-CM

## 2024-07-31 DIAGNOSIS — H52223 Regular astigmatism, bilateral: Secondary | ICD-10-CM | POA: Diagnosis not present

## 2024-08-05 ENCOUNTER — Other Ambulatory Visit: Payer: Self-pay | Admitting: Family Medicine

## 2024-08-20 ENCOUNTER — Ambulatory Visit
Admission: RE | Admit: 2024-08-20 | Discharge: 2024-08-20 | Disposition: A | Discharge status: Home / Self Care | Source: Ambulatory Visit | Attending: Family Medicine | Admitting: Family Medicine

## 2024-08-20 DIAGNOSIS — Z1231 Encounter for screening mammogram for malignant neoplasm of breast: Secondary | ICD-10-CM

## 2024-09-11 ENCOUNTER — Other Ambulatory Visit: Payer: Self-pay | Admitting: Family Medicine

## 2024-10-31 NOTE — Progress Notes (Signed)
 LEXXI KOSLOW                                          MRN: [id number]   10/31/2024   The [facility] Quality Team Specialist reviewed this patient medical record for the purposes of chart review for care gap closure. The following were reviewed: abstraction for care gap closure-glycemic status assessment.    [facility] Quality Team

## 2024-11-18 ENCOUNTER — Ambulatory Visit: Payer: Self-pay

## 2024-11-18 ENCOUNTER — Ambulatory Visit: Admitting: Family Medicine

## 2024-11-18 ENCOUNTER — Encounter: Payer: Self-pay | Admitting: Family Medicine

## 2024-11-18 VITALS — BP 128/80 | HR 94 | Temp 97.6°F | Ht 61.0 in | Wt 152.8 lb

## 2024-11-18 DIAGNOSIS — M26622 Arthralgia of left temporomandibular joint: Secondary | ICD-10-CM

## 2024-11-18 DIAGNOSIS — F418 Other specified anxiety disorders: Secondary | ICD-10-CM | POA: Diagnosis not present

## 2024-11-18 DIAGNOSIS — G4700 Insomnia, unspecified: Secondary | ICD-10-CM | POA: Diagnosis not present

## 2024-11-18 DIAGNOSIS — I10 Essential (primary) hypertension: Secondary | ICD-10-CM

## 2024-11-18 MED ORDER — TRAZODONE HCL 50 MG PO TABS: 25.0000 mg | ORAL_TABLET | Freq: Every evening | ORAL | 3 refills | Status: AC | PRN

## 2024-11-18 NOTE — Telephone Encounter (Signed)
 Appt today

## 2025-01-05 ENCOUNTER — Encounter: Admitting: Family Medicine
